# Patient Record
Sex: Male | Born: 1960 | Race: Black or African American | Hispanic: No | Marital: Married | Smoking: Former smoker
Health system: Southern US, Community
[De-identification: ages and names within clinical notes are randomized; demographics above are authoritative.]

## PROBLEM LIST (undated history)

## (undated) DIAGNOSIS — K612 Anorectal abscess: Secondary | ICD-10-CM

## (undated) DIAGNOSIS — R0600 Dyspnea, unspecified: Secondary | ICD-10-CM

## (undated) DIAGNOSIS — M1990 Unspecified osteoarthritis, unspecified site: Secondary | ICD-10-CM

## (undated) DIAGNOSIS — I509 Heart failure, unspecified: Secondary | ICD-10-CM

## (undated) DIAGNOSIS — N289 Disorder of kidney and ureter, unspecified: Secondary | ICD-10-CM

## (undated) DIAGNOSIS — G4730 Sleep apnea, unspecified: Secondary | ICD-10-CM

## (undated) DIAGNOSIS — R0609 Other forms of dyspnea: Secondary | ICD-10-CM

## (undated) DIAGNOSIS — F419 Anxiety disorder, unspecified: Secondary | ICD-10-CM

## (undated) DIAGNOSIS — J45909 Unspecified asthma, uncomplicated: Secondary | ICD-10-CM

## (undated) DIAGNOSIS — K219 Gastro-esophageal reflux disease without esophagitis: Secondary | ICD-10-CM

## (undated) DIAGNOSIS — K648 Other hemorrhoids: Secondary | ICD-10-CM

## (undated) DIAGNOSIS — K635 Polyp of colon: Secondary | ICD-10-CM

## (undated) DIAGNOSIS — R079 Chest pain, unspecified: Secondary | ICD-10-CM

## (undated) DIAGNOSIS — T7840XA Allergy, unspecified, initial encounter: Secondary | ICD-10-CM

## (undated) DIAGNOSIS — I10 Essential (primary) hypertension: Secondary | ICD-10-CM

## (undated) DIAGNOSIS — J449 Chronic obstructive pulmonary disease, unspecified: Secondary | ICD-10-CM

## (undated) DIAGNOSIS — I499 Cardiac arrhythmia, unspecified: Secondary | ICD-10-CM

## (undated) DIAGNOSIS — K589 Irritable bowel syndrome without diarrhea: Secondary | ICD-10-CM

## (undated) DIAGNOSIS — E785 Hyperlipidemia, unspecified: Secondary | ICD-10-CM

## (undated) DIAGNOSIS — F32A Depression, unspecified: Secondary | ICD-10-CM

## (undated) HISTORY — DX: Chest pain, unspecified: R07.9

## (undated) HISTORY — DX: Heart failure, unspecified: I50.9

## (undated) HISTORY — PX: POLYPECTOMY: SHX149

## (undated) HISTORY — DX: Cardiac arrhythmia, unspecified: I49.9

## (undated) HISTORY — PX: COLONOSCOPY: SHX174

## (undated) HISTORY — DX: Other hemorrhoids: K64.8

## (undated) HISTORY — DX: Sleep apnea, unspecified: G47.30

## (undated) HISTORY — DX: Anorectal abscess: K61.2

## (undated) HISTORY — DX: Chronic obstructive pulmonary disease, unspecified: J44.9

## (undated) HISTORY — DX: Disorder of kidney and ureter, unspecified: N28.9

## (undated) HISTORY — DX: Essential (primary) hypertension: I10

## (undated) HISTORY — DX: Hyperlipidemia, unspecified: E78.5

## (undated) HISTORY — DX: Allergy, unspecified, initial encounter: T78.40XA

## (undated) HISTORY — DX: Depression, unspecified: F32.A

## (undated) HISTORY — DX: Dyspnea, unspecified: R06.00

## (undated) HISTORY — DX: Irritable bowel syndrome, unspecified: K58.9

## (undated) HISTORY — DX: Other forms of dyspnea: R06.09

## (undated) HISTORY — DX: Unspecified osteoarthritis, unspecified site: M19.90

## (undated) HISTORY — DX: Polyp of colon: K63.5

## (undated) HISTORY — DX: Anxiety disorder, unspecified: F41.9

## (undated) HISTORY — DX: Gastro-esophageal reflux disease without esophagitis: K21.9

## (undated) HISTORY — PX: TONSILLECTOMY: SUR1361

---

## 2001-09-05 ENCOUNTER — Encounter: Payer: Self-pay | Admitting: Emergency Medicine

## 2001-09-05 ENCOUNTER — Emergency Department (HOSPITAL_COMMUNITY): Admission: EM | Admit: 2001-09-05 | Discharge: 2001-09-06 | Payer: Self-pay | Admitting: Emergency Medicine

## 2001-09-07 DIAGNOSIS — G4730 Sleep apnea, unspecified: Secondary | ICD-10-CM

## 2001-09-07 HISTORY — DX: Sleep apnea, unspecified: G47.30

## 2001-09-07 HISTORY — PX: ULNAR COLLATERAL LIGAMENT RECONSTRUCTION: SHX2593

## 2003-05-04 ENCOUNTER — Encounter: Payer: Self-pay | Admitting: Internal Medicine

## 2004-02-18 ENCOUNTER — Emergency Department (HOSPITAL_COMMUNITY): Admission: EM | Admit: 2004-02-18 | Discharge: 2004-02-18 | Payer: Self-pay | Admitting: Emergency Medicine

## 2006-05-17 ENCOUNTER — Ambulatory Visit: Payer: Self-pay | Admitting: Internal Medicine

## 2006-06-18 ENCOUNTER — Ambulatory Visit: Payer: Self-pay | Admitting: Internal Medicine

## 2009-11-06 ENCOUNTER — Encounter (INDEPENDENT_AMBULATORY_CARE_PROVIDER_SITE_OTHER): Payer: Self-pay | Admitting: *Deleted

## 2010-10-07 NOTE — Procedures (Signed)
Summary: Colonoscopy/[facility]  Colonoscopy/[facility]   Imported By: Sherian Rein 12/18/2009 07:09:12  _____________________________________________________________________  External Attachment:    Type:   Image     Comment:   External Document

## 2010-10-07 NOTE — Procedures (Signed)
Summary: Colonoscopy   Colonoscopy  Procedure date:  06/18/2006  Findings:      Location:  [facility].  Results: Hemorrhoids.       Procedures Next Due Date:    Colonoscopy: 06/2011  Colonoscopy  Procedure date:  06/18/2006  Findings:      Location:  [facility].  Results: Hemorrhoids.       Procedures Next Due Date:    Colonoscopy: 06/2011 Patient Name: [name], [name] MRN:  Procedure Procedures: Colonoscopy CPT: 16109.  Personnel: Endoscopist: Wilhemina Bonito. Marina Goodell, MD.  Referred By: Creola Corn, MD.  Exam Location: Exam performed in Outpatient Clinic. Outpatient  Patient Consent: Procedure, Alternatives, Risks and Benefits discussed, consent obtained, from patient. Consent was obtained by the RN.  Indications  Surveillance of: Adenomatous Polyp(s). This is an initial surveillance exam. Initial polypectomy was performed in 2004. in Aug. 1-2 Polyps were found at Index Exam. Largest polyp removed was 10 to 19 mm. Prior polyp located in distal colon. Pathology of worst  polyp: villous adenoma.  History  Current Medications: Patient is not currently taking Coumadin.  Pre-Exam Physical: Performed Jun 18, 2006. Cardio-pulmonary exam, Rectal exam WNL. HEENT exam  abnormal. Abdominal exam, Mental status exam WNL. Abnormal PE findings include: thick neck,large body habitus.  Comments: Pt. history reviewed/updated, physical exam performed prior to initiation of sedation? yes Exam Exam: Extent of exam reached: Cecum, extent intended: Cecum.  The cecum was identified by IC valve. Patient position: from side to side. Time to Cecum: 00:09:32. Time for Withdrawl: 00:14:01. Colon retroflexion performed. Images taken. ASA Classification: II. Tolerance: excellent.  Monitoring: Pulse and BP monitoring, Oximetry used. Supplemental O2 given.  Colon Prep Used osmo prep for colon prep. Prep results: excellent.  Sedation Meds: Patient assessed and  found to be appropriate for moderate (conscious) sedation. Fentanyl 100 mcg. given IV. Versed 14 given IV.  Findings NORMAL EXAM: Cecum to Rectum. Comments: unable to visualize appy orifice despite change in body position and great effort.   Assessment  Diagnoses: 455.0: Hemorrhoids, Internal. friable.   Comments: NO POLYPS SEEN Events  Unplanned Interventions: No intervention was required.  Unplanned Events: There were no complications. Plans Patient Education: Patient given standard instructions for: Hemorrhoids.  Comments: metamucil 2 tablespoons daily in 14 oz water or juice Disposition: After procedure patient sent to recovery. After recovery patient sent home.  Scheduling/Referral: Colonoscopy, to Wilhemina Bonito. Marina Goodell, MD, in 5 years,    This report was created from the original endoscopy report, which was reviewed and signed by the above listed endoscopist.   cc:  Creola Corn, MD      The Patient

## 2010-10-07 NOTE — Letter (Signed)
Summary: New Patient letter  [facility]  [address] [place], [place] [address]   Phone: [phone]  Fax: [phone]       11/06/2009 MRN: [id number]  [name] [address] [place], [place]  [address]  Dear Mr. PUROHIT,  Welcome to the Gastroenterology Division at [facility].    You are scheduled to see Dr. Marina Goodell on 12/19/2009 at 9:30AM on the 3rd floor at [facility], [address]  We ask that you try to arrive at our office 15 minutes prior to your appointment time to allow for check-in.  We would like you to complete the enclosed self-administered evaluation form prior to your visit and bring it with you on the day of your appointment.  We will review it with you.  Also, please bring a complete list of all your medications or, if you prefer, bring the medication bottles and we will list them.  Please bring your insurance card so that we may make a copy of it.  If your insurance requires a referral to see a specialist, please bring your referral form from your primary care physician.  Co-payments are due at the time of your visit and may be paid by cash, check or credit card.     Your office visit will consist of a consult with your physician (includes a physical exam), any laboratory testing he/she may order, scheduling of any necessary diagnostic testing (e.g. x-ray, ultrasound, CT-scan), and scheduling of a procedure (e.g. Endoscopy, Colonoscopy) if required.  Please allow enough time on your schedule to allow for any/all of these possibilities.    If you cannot keep your appointment, please call [phone] to cancel or reschedule prior to your appointment date.  This allows [place] the opportunity to schedule an appointment for another patient in need of care.  If you do not cancel or reschedule by 5 p.m. the business day prior to your appointment date, you will be charged a $50.00 late cancellation/no-show fee.    Thank you for choosing   Gastroenterology for your medical needs.  We appreciate the opportunity to care for you.  Please visit [place] at our website  to learn more about our practice.                     Sincerely,                                                             The Gastroenterology Division

## 2011-01-23 NOTE — Assessment & Plan Note (Signed)
[facility]                           GASTROENTEROLOGY OFFICE NOTE   [name], [name]                      MRN:          [id number]  DATE:05/17/2006                            DOB:          [birth year]    REASON FOR CONSULTATION:  Surveillance colonoscopy.   HISTORY:  This is a d45 year old African American male with a history of  hypertension, sleep apnea, morbid obesity, and adenomatous colon polyps who  was referred through the courtesy of Dr. Timothy Lasso, regarding surveillance  colonoscopy.  The patient underwent colonoscopy with Dr. Sharrell Ku in  August 2004, to evaluate rectal bleeding and hemoccult positive stool.  Examination revealed a large sessile polyp in the descending colon which was  removed endoscopically.  This measured at least 1-cm in greatest dimension  and was found to have villous architecture.  Followup in 3 years  recommended.  The patient received a recall letter from Dr. Jennye Boroughs office.  However, his current insurance is not accepted by that office and he was  referred to this clinic.  The patient also had a colonoscopy in 1998, to  evaluate rectal bleeding.  This was negative except for hemorrhoids.  He  does mention some intermittent problems with his hemorrhoids as manifested  by some occasional bleeding.  No pain or other symptoms.  He also mentions  having symptomatic reflux disease as manifested by substernal chest  discomfort.  He did take a 14-day course of Prilosec OTC with resolution of  symptom.  He denies dysphagia or weight loss.   PAST MEDICAL HISTORY:  1. Hypertension.  2. Sleep apnea.  3. Morbid obesity.  4. History of adenomatous colon polyps.   PAST SURGICAL HISTORY:  Left thumb surgery.   ALLERGIES:  NO KNOWN DRUG ALLERGIES.   CURRENT MEDICATIONS:  1. Indapamide, unspecified dosage, 1/2 daily.  2. The patient also uses a CPAP machine at night.   FAMILY HISTORY:  Negative for  gastrointestinal malignancy.   SOCIAL HISTORY:  The patient is married with 3 daughters, lives with his  wife.  He attended [employer].  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  Does not smoke.  Occasionally uses  alcohol.   REVIEW OF SYSTEMS:  Per diagnostic evaluation form.   PHYSICAL EXAMINATION:  GENERAL:  A well-appearing male in no acute distress.  VITAL SIGNS:  Blood pressure 130/84, heart rate is 64 and regular, weight is  310.4 pounds.  He is 5 feet 9 inches in height.  HEENT:  Sclerae are anicteric.  Conjunctivae are pink.  Oral mucosa is  intact.  No adenopathy.  LUNGS:  Clear.  HEART:  Regular.  ABDOMEN:  Obese and soft without tenderness, mass, or hernia.  Good bowel  sounds heard.  EXTREMITIES:  Without edema.   IMPRESSION:  1. History of villous adenoma of the descending colon, due to a      surveillance colonoscopy.  2. Internal hemorrhoids with intermittent moderate bleeding.  3. Gastroesophageal reflux disease.  Symptoms controlled with short course      of Prilosec.  No alarm symptoms.  RECOMMENDATIONS:  1. Schedule colonoscopy with polypectomy if necessary.  The nature of the      procedure as well as the risks, benefits, and alternatives were      discussed in detail.  He understood and agreed to proceed.  He prefers      the Osmo prep.  2. Prilosec OTC on demand.  3. Fiber supplement for hemorrhoids.                                   Wilhemina Bonito. Eda Keys., MD   JNP/MedQ  DD:  05/17/2006  DT:  05/18/2006  Job #:  [id number]   cc:   Gwen Pounds, MD

## 2011-06-09 ENCOUNTER — Inpatient Hospital Stay (HOSPITAL_COMMUNITY)
Admission: AD | Admit: 2011-06-09 | Discharge: 2011-06-13 | DRG: 348 | Disposition: A | Discharge status: Home Health | Payer: Managed Care, Other (non HMO) | Source: Ambulatory Visit | Attending: Internal Medicine | Admitting: Internal Medicine

## 2011-06-09 DIAGNOSIS — N179 Acute kidney failure, unspecified: Secondary | ICD-10-CM | POA: Diagnosis not present

## 2011-06-09 DIAGNOSIS — I10 Essential (primary) hypertension: Secondary | ICD-10-CM | POA: Diagnosis present

## 2011-06-09 DIAGNOSIS — E669 Obesity, unspecified: Secondary | ICD-10-CM | POA: Diagnosis present

## 2011-06-09 DIAGNOSIS — R7309 Other abnormal glucose: Secondary | ICD-10-CM | POA: Diagnosis present

## 2011-06-09 DIAGNOSIS — Z860100 Personal history of colon polyps, unspecified: Secondary | ICD-10-CM

## 2011-06-09 DIAGNOSIS — G4700 Insomnia, unspecified: Secondary | ICD-10-CM | POA: Diagnosis present

## 2011-06-09 DIAGNOSIS — G4733 Obstructive sleep apnea (adult) (pediatric): Secondary | ICD-10-CM | POA: Diagnosis present

## 2011-06-09 DIAGNOSIS — Z6841 Body Mass Index (BMI) 40.0 and over, adult: Secondary | ICD-10-CM

## 2011-06-09 DIAGNOSIS — K612 Anorectal abscess: Secondary | ICD-10-CM

## 2011-06-09 DIAGNOSIS — K219 Gastro-esophageal reflux disease without esophagitis: Secondary | ICD-10-CM | POA: Diagnosis present

## 2011-06-09 HISTORY — PX: INCISION AND DRAINAGE PERIRECTAL ABSCESS: SHX1804

## 2011-06-09 LAB — PROTIME-INR
INR: 1.18 (ref 0.00–1.49)
Prothrombin Time: 15.3 seconds — ABNORMAL HIGH (ref 11.6–15.2)

## 2011-06-09 LAB — SURGICAL PCR SCREEN
MRSA, PCR: NEGATIVE
Staphylococcus aureus: NEGATIVE

## 2011-06-09 LAB — APTT: aPTT: 31 seconds (ref 24–37)

## 2011-06-09 LAB — COMPREHENSIVE METABOLIC PANEL
AST: 14 U/L (ref 0–37)
Albumin: 3 g/dL — ABNORMAL LOW (ref 3.5–5.2)
CO2: 24 mEq/L (ref 19–32)
Calcium: 8.6 mg/dL (ref 8.4–10.5)
Creatinine, Ser: 1.15 mg/dL (ref 0.50–1.35)
GFR calc non Af Amer: 74 mL/min — ABNORMAL LOW (ref >90)

## 2011-06-09 LAB — CBC
Platelets: 196 10*3/uL (ref 150–400)
RDW: 13.5 % (ref 11.5–15.5)
WBC: 17.4 10*3/uL — ABNORMAL HIGH (ref 4.0–10.5)

## 2011-06-09 LAB — MRSA PCR SCREENING: MRSA by PCR: NEGATIVE

## 2011-06-09 LAB — GLUCOSE, CAPILLARY: Glucose-Capillary: 158 mg/dL — ABNORMAL HIGH (ref 70–99)

## 2011-06-10 LAB — GLUCOSE, CAPILLARY
Glucose-Capillary: 120 mg/dL — ABNORMAL HIGH (ref 70–99)
Glucose-Capillary: 145 mg/dL — ABNORMAL HIGH (ref 70–99)
Glucose-Capillary: 158 mg/dL — ABNORMAL HIGH (ref 70–99)

## 2011-06-11 ENCOUNTER — Inpatient Hospital Stay (HOSPITAL_COMMUNITY): Payer: Managed Care, Other (non HMO)

## 2011-06-11 LAB — BASIC METABOLIC PANEL
BUN: 20 mg/dL (ref 6–23)
BUN: 21 mg/dL (ref 6–23)
CO2: 20 mEq/L (ref 19–32)
Calcium: 8.4 mg/dL (ref 8.4–10.5)
Chloride: 98 mEq/L (ref 96–112)
GFR calc Af Amer: 27 mL/min — ABNORMAL LOW (ref >90)
GFR calc non Af Amer: 24 mL/min — ABNORMAL LOW (ref >90)
Glucose, Bld: 123 mg/dL — ABNORMAL HIGH (ref 70–99)
Potassium: 4.3 mEq/L (ref 3.5–5.1)
Potassium: 3.4 mEq/L — ABNORMAL LOW (ref 3.5–5.1)
Sodium: 133 mEq/L — ABNORMAL LOW (ref 135–145)

## 2011-06-11 LAB — SODIUM, URINE, RANDOM: Sodium, Ur: 20 mEq/L

## 2011-06-11 LAB — CBC
HCT: 35.9 % — ABNORMAL LOW (ref 39.0–52.0)
Hemoglobin: 12.3 g/dL — ABNORMAL LOW (ref 13.0–17.0)
RBC: 4.34 MIL/uL (ref 4.22–5.81)
WBC: 15 10*3/uL — ABNORMAL HIGH (ref 4.0–10.5)

## 2011-06-11 LAB — CULTURE, ROUTINE-ABSCESS

## 2011-06-11 LAB — GLUCOSE, CAPILLARY

## 2011-06-11 NOTE — Op Note (Signed)
  NAMEKYRIE, FLUDD NO.:  [id number]  MEDICAL RECORD NO.:  [id number]  LOCATION:  5511                         FACILITY:  [facility]  PHYSICIAN:  Gabrielle Dare. Janee Morn, M.D.DATE OF BIRTH:  1961/09/04  DATE OF PROCEDURE:  06/09/2011 DATE OF DISCHARGE:                              OPERATIVE REPORT   PREOPERATIVE DIAGNOSIS:  Left perirectal abscess.  POSTOPERATIVE DIAGNOSIS:  Left perirectal abscess.  PROCEDURE:  Incision and drainage, left perirectal abscess.  SURGEON:  Gabrielle Dare. Janee Morn, MD  ANESTHESIA:  General endotracheal.  ESTIMATED BLOOD LOSS:  50 mL.  HISTORY OF PRESENT ILLNESS:  Mr. Gignac is a 50 year old African American gentleman who is admitted with a large left perirectal abscess by Dr. Timothy Lasso.  He has been started on intravenous antibiotics.  We are taking him urgently to the operating room for incision and drainage.  PROCEDURE IN DETAIL:  Informed consent was obtained.  The patient was identified in the preop holding area and he is receiving intravenous antibiotics.  He was brought to the operating room.  General endotracheal anesthesia was administered by the anesthesia staff.  He was placed in lithotomy position.  His perianal and perirectal region and groin were prepped and draped in a sterile fashion.  He had very large fluctuant in the left gluteal cheek especially extending up to the anal area.  Digital rectal exam was performed, it was limited little bit due to the edema, but no gross abnormalities and no pus were noted. Next, I changed my gloves and the incision was made over the most fluctuant hard area about 3 cm in length.  Dissection was carried down into two large cavities, one extending anteriorly and one extending more deep.  Large volume of pus was returned.  This was sent for culture, both aerobic and anaerobic.  The wound was then thoroughly cleaned out. The loculations were broken up.  Hemostasis was obtained with  Bovie cautery.  We then irrigated out the wound with copious amounts of saline.  The wound was much cleaner.  It was then packed with a long length of saline- soaked Kerlix.  There was good hemostasis.  A bulky sterile dressing was applied.  The patient tolerated the procedure well without apparent complication, and was taken to recovery room in stable condition.  All counts were correct.     Gabrielle Dare Janee Morn, M.D.     BET/MEDQ  D:  06/09/2011  T:  06/10/2011  Job:  [id number]  cc:   Gwen Pounds, MD  Electronically Signed by Violeta Gelinas M.D. on 06/11/2011 02:30:51 PM

## 2011-06-12 LAB — RENAL FUNCTION PANEL
BUN: 19 mg/dL (ref 6–23)
CO2: 24 mEq/L (ref 19–32)
Chloride: 103 mEq/L (ref 96–112)
GFR calc Af Amer: 33 mL/min — ABNORMAL LOW (ref >90)
Glucose, Bld: 115 mg/dL — ABNORMAL HIGH (ref 70–99)
Potassium: 3.4 mEq/L — ABNORMAL LOW (ref 3.5–5.1)
Sodium: 138 mEq/L (ref 135–145)

## 2011-06-12 LAB — GLUCOSE, CAPILLARY
Glucose-Capillary: 127 mg/dL — ABNORMAL HIGH (ref 70–99)
Glucose-Capillary: 117 mg/dL — ABNORMAL HIGH (ref 70–99)
Glucose-Capillary: 117 mg/dL — ABNORMAL HIGH (ref 70–99)
Glucose-Capillary: 123 mg/dL — ABNORMAL HIGH (ref 70–99)

## 2011-06-12 LAB — URINALYSIS, ROUTINE W REFLEX MICROSCOPIC
Glucose, UA: NEGATIVE mg/dL
Ketones, ur: 15 mg/dL — AB
Leukocytes, UA: NEGATIVE
Nitrite: NEGATIVE
Specific Gravity, Urine: 1.013 (ref 1.005–1.030)
pH: 6 (ref 5.0–8.0)

## 2011-06-12 LAB — CBC
HCT: 34.7 % — ABNORMAL LOW (ref 39.0–52.0)
Hemoglobin: 11.6 g/dL — ABNORMAL LOW (ref 13.0–17.0)
RBC: 4.19 MIL/uL — ABNORMAL LOW (ref 4.22–5.81)

## 2011-06-13 LAB — RENAL FUNCTION PANEL
Albumin: 2.5 g/dL — ABNORMAL LOW (ref 3.5–5.2)
Calcium: 8.6 mg/dL (ref 8.4–10.5)
GFR calc Af Amer: 37 mL/min — ABNORMAL LOW (ref >90)
Glucose, Bld: 123 mg/dL — ABNORMAL HIGH (ref 70–99)
Phosphorus: 4.2 mg/dL (ref 2.3–4.6)
Sodium: 139 mEq/L (ref 135–145)

## 2011-06-13 LAB — CBC
Hemoglobin: 11.3 g/dL — ABNORMAL LOW (ref 13.0–17.0)
MCH: 27.7 pg (ref 26.0–34.0)
MCHC: 33.1 g/dL (ref 30.0–36.0)
RDW: 13.2 % (ref 11.5–15.5)

## 2011-06-13 LAB — GLUCOSE, CAPILLARY: Glucose-Capillary: 126 mg/dL — ABNORMAL HIGH (ref 70–99)

## 2011-06-16 LAB — CULTURE, BLOOD (ROUTINE X 2)
Culture  Setup Time: 201210030718
Culture  Setup Time: 201210031056
Culture: NO GROWTH
Culture: NO GROWTH

## 2011-06-16 LAB — ANAEROBIC CULTURE

## 2011-06-22 ENCOUNTER — Telehealth (INDEPENDENT_AMBULATORY_CARE_PROVIDER_SITE_OTHER): Payer: Self-pay

## 2011-06-22 NOTE — Telephone Encounter (Signed)
Patients wife called back to get a follow up appointment for patient. Needs a 7 to 10 day follow up. Said she called last week and no one called her back to fit her in to be seen with Janee Morn. Please call patient if you can find time with [facility].

## 2011-06-22 NOTE — Telephone Encounter (Signed)
Appointment was made with another physician this week.

## 2011-06-24 ENCOUNTER — Ambulatory Visit (INDEPENDENT_AMBULATORY_CARE_PROVIDER_SITE_OTHER): Payer: Managed Care, Other (non HMO) | Admitting: General Surgery

## 2011-06-24 ENCOUNTER — Encounter (INDEPENDENT_AMBULATORY_CARE_PROVIDER_SITE_OTHER): Payer: Self-pay | Admitting: General Surgery

## 2011-06-24 VITALS — BP 136/90 | HR 64 | Temp 98.3°F | Resp 20 | Ht 71.0 in | Wt 325.1 lb

## 2011-06-24 DIAGNOSIS — K612 Anorectal abscess: Secondary | ICD-10-CM

## 2011-06-24 DIAGNOSIS — K611 Rectal abscess: Secondary | ICD-10-CM

## 2011-06-24 NOTE — Progress Notes (Signed)
Subjective:     Patient ID: [name], male   DOB: 25-Aug-1961, 50 y.o.   MRN: [id number]  HPI This patient follows up in approximately 2 weeks status post incision and drainage of left buttock and perirectal abscess. His hospital stay was complicated by elevated creatinine which he tells me was due to antibiotics and blood pressure medication. He has been followed by his primary physician Dr. Timothy Lasso for this. He is no longer taking antibiotics. His daughter has been doing daily dressing changes for his buttock wound.  Review of Systems     Objective:   Physical Exam The wound appears to be healing well. There is no sign of residual infection. He has healthy granulation tissue with a small amount of fibrinous exudate. The wound is shallow and requires only very minimal packing and the medial aspect of the wound. I think that this should continue to heal should be healed within 2 weeks.    Assessment:     Status post I&D of left buttock wound-doing well.    Plan:     The wound should continue to heal with daily dressing changes. I recommended that he shower normally. He should followup in approximately 2 weeks for repeat evaluation to insure complete wound healing. He is going to followup with Dr. Timothy Lasso for his elevated creatinine.

## 2011-06-30 ENCOUNTER — Encounter: Payer: Self-pay | Admitting: Internal Medicine
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
## 2011-07-01 ENCOUNTER — Encounter: Payer: Self-pay | Admitting: Internal Medicine

## 2011-07-01 NOTE — Consult Note (Signed)
NAMEMarland Kitchen  TYTAN, SANDATE NO.:  [id number]  MEDICAL RECORD NO.:  [id number]  LOCATION:                                 FACILITY:  PHYSICIAN:  Gardiner Barefoot, MD    DATE OF BIRTH:  1961/03/04  DATE OF CONSULTATION: DATE OF DISCHARGE:                                CONSULTATION   CONSULTING PHYSICIAN:  Dr. Timothy Lasso.  REASON FOR CONSULTATION:  Antibiotic management.  HISTORY OF PRESENT ILLNESS:  This is a 50 year old male with obesity, obstructive sleep apnea, and diabetes versus impaired fasting glucose, who had several days of what sounds like perirectal pain, thought to be due to his hemorrhoids, and not improving with home therapy, and was felt to be a cellulitis in the gluteal area versus a concern for abscess development.  He was sent into [facility] for evaluation by General Surgery and that was felt to have a perirectal abscess and underwent incision and drainage on October 2.  In the operating arena, it was found to have a large fluctuant area in the left gluteal cheek that did extend to the anal area and an incision was done and large volume of pus was expressed and irrigated.  Cultures subsequently did show a Gram stain that was polymicrobial and the culture that did grow Streptococcus.  Though post surgery did feels significant relief in fact he has had much very little pain since the procedure.  He has had a fever up to 102.7 yesterday a.m., however, has been afebrile since. Additionally, his vital signs otherwise had been stable.  PAST MEDICAL HISTORY: 1. Hypertension. 2. Obesity. 3. Obstructive sleep apnea. 4. History of anemia. 5. Diabetes with A1c of 6.3-6.5. 6. Insomnia. 7. History of cardiomyopathy. 8. GERD. 9. History of left thumb surgery, T and A, circumcision, and trigger     finger injection.  MEDICATIONS:  Currently include vancomycin and Zosyn.  ALLERGIES:  No known drug allergies.  SOCIAL HISTORY:  The patient is  married and his wife is at the bedside. He did quit smoking in about 1997.  FAMILY HISTORY:  There is a father with diabetes and coronary artery disease as well as glaucoma.  The mother also has diabetes and hypertension.  REVIEW OF SYSTEMS:  A 12-point review of systems was negative except as per the history of present illness.  PHYSICAL EXAMINATION:  VITAL SIGNS:  Temperature is 99.2, T-max is 102.7 which was more than 24 hours ago, pulse 96, respirations 24, blood pressure 115/75, and O2 sats 94% on CPAP, nasal. GENERAL:  The patient is awake, alert, and oriented x3, and appears in no acute distress. CARDIOVASCULAR:  Regular rate and rhythm.  No murmurs, rubs, or gallops. LUNGS:  Clear to auscultation bilaterally. ABDOMEN:  Morbidly obese, nontender, nondistended, with positive bowel sounds.  No hepatosplenomegaly. EXTREMITIES:  No edema. GU:  Rectal area does show no fluctuance, it is mildly tender, but no current drainage, there is a bandage overlying it that does not appear to be significantly saturated.  LABS:  Blood cultures all remained negative during this hospitalization. MRSA/PCR screening is negative.  Abscess culture does show  a polymicrobial Gram stain with a streptococci as a  dominant organism on the culture.  Creatinine is 2.95 which is higher than his baseline which was in the normal range.  WBC today is 15.0, which is improved from 17 the previous day.  ASSESSMENT:  Perirectal abscess based on the culture results which do support the diagnosis.  RECOMMENDATIONS:  For perirectal abscess, there is a little role for antibiotics and is generally a surgical management.  I have stopped his IV antibiotics.  With diabetes though I will give him a short course of Augmentin which he can take for about 7 days.  I did tell him though if he has any difficulty with intolerance, to just stop the antibiotic as there is no significant role for the antibiotics.  There is no  staph organisms suggestion of this or skin abscess and therefore no need for longer course of therapy.     Gardiner Barefoot, MD     RWC/MEDQ  D:  06/11/2011  T:  06/11/2011  Job:  [id number]  Electronically Signed by Staci Righter MD on 07/01/2011 04:37:17 PM
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
## 2011-07-05 NOTE — Consult Note (Signed)
[name], EARLY NO.:  [id number]  MEDICAL RECORD NO.:  [id number]  LOCATION:  5511                         FACILITY:  [facility]  PHYSICIAN:  Gabrielle Dare. Janee Morn, M.D.DATE OF BIRTH:  Sep 04, 1961  DATE OF CONSULTATION:  06/09/2011 DATE OF DISCHARGE:                                CONSULTATION   REQUESTING PHYSICIAN:  Gwen Pounds, MD  CONSULTING SURGEON:  Gabrielle Dare. Janee Morn, MD  REASON FOR CONSULTATION:  Perirectal abscess.  HISTORY OF PRESENT ILLNESS:  [name] is a 50 year old African American gentleman who has an underlying history of hypertension and diet-controlled diabetes.  He states he had a profuse course of nausea, vomiting, and diarrhea after eating some shrimp couple of nights ago, started feeling a little bit better, but then developed some perirectal pain and swelling.  He saw his primary care physician who placed him on some p.o. antibiotics, but within 24 hours the swelling was worse.  His pain was worse and his primary care physician, Dr. Timothy [name], believes that patient had developing worsening perirectal abscess that is in need of the surgical care.  He has now subsequently been admitted and we are asked to evaluate him.  He denies any fever, but aside from his nausea, vomiting, diarrhea course, he denies any other abdominal pain or complaints.  He has not had any recent trauma or travel history.  MEDICAL HISTORY:  Significant for hypertension, diet-controlled diabetes mellitus, and obesity.  PAST SURGICAL HISTORY:  The patient had tonsillectomy as a child.  No prior abdominal surgery.  FAMILY HISTORY:  Noncontributory to present case.  SOCIAL HISTORY:  The patient is a former smoker, quit about 15 years ago, has an occasional alcoholic beverage.  Denies any illicit drug use. He is married.  He lives at home with his wife and is employed for an [employer], [employer].  REVIEW OF SYSTEMS:  Please see history of present illness for  pertinent findings.  PHYSICAL EXAMINATION:  GENERAL:  Reveals an obese 50 year old African American gentleman who is nontoxic appearing. VITAL SIGNS:  Show temperature of 98.5, heart rate of 72, blood pressure 136/90, respiratory rate of 16, oxygen saturation 100% on room air. ENT:  Unremarkable.  Neck is supple without lymphadenopathy. LUNGS:  Clear to auscultation.  No wheezes, rhonchi, or rales.  Normal respiratory effort without use of accessory muscles.  HEART:  Regular rate and rhythm.  No murmurs, gallops, or rubs.  Carotids 2+ and brisk without bruit. ABDOMEN:  Soft, nondistended, nontender.  Normal bowel sounds. Perirectal region reveals a large area of induration, erythema, and tenderness on the left medial buttock adjacent to the anus.  There is no active draining, but there is evidence of fluctuance consistent with perirectal abscess.  No adjacent tracking to the perineum or scrotum is appreciated.  LABS:  No labs have been drawn.  No imaging has been performed.  IMPRESSION: 1. Large left perirectal abscess. 2. Hypertension. 3. Diet controlled diabetes mellitus. 4. Obesity.  RECOMMENDATIONS:  The patient needs incision and drainage in an operative setting.  I have discussed this with the patient including the risks, complications, postoperative expectations with his wife as well. He agrees and will consent, we will proceed  with that.     Brayton El, PA-C   ______________________________ Gabrielle Dare. Janee Morn, M.D.    KB/MEDQ  D:  06/09/2011  T:  06/10/2011  Job:  [id number]  Electronically Signed by Brayton El  on 06/24/2011 03:50:17 PM Electronically Signed by Violeta Gelinas M.D. on 07/05/2011 08:51:25 AM

## 2011-07-05 NOTE — Consult Note (Signed)
[name], [name] NO.:  [id number]  MEDICAL RECORD NO.:  [id number]  LOCATION:  5505                         FACILITY:  [facility]  PHYSICIAN:  Dr. Allena Katz              DATE OF BIRTH:  Jan 13, 1961  DATE OF CONSULTATION:  06/11/2011 DATE OF DISCHARGE:                                CONSULTATION   PRIMARY CARE PHYSICIAN:  Dr. Timothy Lasso.  CHIEF COMPLAINT:  Acute renal failure status post I and D of a perirectal abscess.  HISTORY OF PRESENT ILLNESS:  The patient is a 50 year old male who was admitted by PCP for perirectal abscess 2 days ago.  The patient suffered from an acute diarrheal illness approximately 5 days ago and developed some pain in the anal area approximately 3 days ago.  He went to his PCP's office where he was treated with oral clindamycin.  The following day his pain had worsened and he returned.  His PCP was quite concerned about the progression of his condition and admitted him.  General Surgery was consulted and performed an I and D on June 09, 2011.  The patient tolerated the procedure well and was doing well postoperatively. He did not have any labs drawn on June 10, 2011.  When his BMET was drawn on June 11, 2011, he was discovered to have an elevated creatinine to 2.85 and a repeat later that day showed a creatinine of 2.95.  The patient had been on an ACE inhibitor at that time which was subsequently discontinued, and had also been on vanc and Zosyn both of which have been continued by Infectious Disease.  The patient was noted to have a single episode of mild hypotension with systolic blood pressure into the 90s at the end of his procedure on October 2, he was also noted to have a mildly elevated creatinine of 1.1 at the time of admission.  Today, the patient reports no recent contrast studies, good urine output, and no problems with p.o. intake.  PAST MEDICAL HISTORY:  Impaired glucose tolerance, hypertension, obesity, obstructive  sleep apnea on CPAP, and gastroesophageal reflux disease.  FAMILY HISTORY:  Father had diabetes, osteoarthritis, coronary artery disease, hypertension, and glaucoma.  Mother had diabetes, hypertension, and osteoarthritis.  SOCIAL HISTORY:  Married with 3 children.  Tobacco use previously, but quit in 1997.  No significant alcohol use.  No illicit substance use.  ALLERGIES:  No known drug allergies.  MEDICATIONS: 1. Protonix 40 mg by mouth daily. 2. Subcu heparin 3 times daily. 3. Augmentin by mouth per Infectious Disease recommendations. 4. Tylenol as needed. 5. IV Dilaudid as needed. 6. Percocet by mouth as needed. 7. Morphine IV as needed. 8. Ambien 10 mg by mouth at night as needed. 9. Maalox by mouth as needed. 10.Benadryl as needed. 11.Sliding scale insulin scheduled every 6 hours. 12.Senokot 2 tablets by mouth at night. 13.Flexeril 10 mg by mouth 3 times daily as needed.  PHYSICAL EXAMINATION:  VITAL SIGNS:  Pulse 96, blood pressure 115/75, respirations 22, satting 94% on room air.  Temperature is 99.2 degrees Fahrenheit. GENERAL:  No acute distress, morbidly obese. HEENT:  Mucous membranes are moist. NECK:  Large  neck, no ability to assess for jugular venous distention. CARDIOVASCULAR:  Regular rate and rhythm. RESPIRATORY:  Clear to auscultation bilaterally, decreased bilaterally. ABDOMEN:  Soft, nontender, nondistended, extremely obese. EXTREMITIES:  Trace lower extremity edema with 2+ pulses. NEURO:  Alert and oriented, moves all extremities without problem.  The patient's I's and O's are reported as 2.3 L in and 1.3 L out yesterday with 710 mL in today.  LABS AND IMAGING:  CBC shows a white count of 15 and hemoglobin of 12.3. Basic metabolic notable for a potassium of 3.4, bicarb of 24, BUN of 21, and creatinine of 2.95.  CK obtained on the date of admission was 309. Creatinine trend is 1.15 on admission rising to 2.85 this morning, rising to 2.95 this  afternoon. Of note, there is no imaging that has been performed during this admission.  ASSESSMENT AND PLAN:  This is a 50 year old male with hypertension and impaired glucose tolerance presented with acute kidney injury following I and D of a perirectal abscess. 1. Acute kidney injury:  Feel this is likely prerenal azotemia     resulting from a diarrheal illness complicated by ACE inhibitor     administration and mild postoperative hypotension resulting in     ischemic acute tubular necrosis.  He was on vancomycin for 3 doses,     and this can have some nephrotoxic effect.  His renal ultrasound,     renal sediment, and electrolytes are currently pending.  We would     agree with discontinuing the ACE inhibitor, recommend a renal panel     tomorrow accompanied by the addition of urine, creatinine level 2     is already ordered labs.  We will obtain a vancomycin level in the     morning to monitor for toxicity.  We would expect that his ATN     would likely plateau some time within the next 24-36 hours and we     will continue to follow in 2 weeks with the appropriate     resolution.. 2. Hypertension:  We would avoid nephrotoxic drugs at this point, as     is already being done.  He does not currently appear to be having     significant problems with hypertension. 3. Infectious Disease:  Now on Augmentin per ID recommendations.     Agree with current management.    ______________________________ Majel Homer, MD   ______________________________ Dr. Allena Katz    ER/MEDQ  D:  06/11/2011  T:  06/11/2011  Job:  [id number]  Electronically Signed by Manuela Neptune MD on 06/19/2011 09:05:31 AM Electronically Signed by Zetta Bills MD on 07/05/2011 10:18:54 AM

## 2011-07-08 ENCOUNTER — Encounter (INDEPENDENT_AMBULATORY_CARE_PROVIDER_SITE_OTHER): Payer: Self-pay | Admitting: General Surgery

## 2011-07-08 ENCOUNTER — Ambulatory Visit (INDEPENDENT_AMBULATORY_CARE_PROVIDER_SITE_OTHER): Payer: Managed Care, Other (non HMO) | Admitting: General Surgery

## 2011-07-08 VITALS — BP 148/92 | HR 60 | Temp 97.0°F | Resp 20 | Ht 71.0 in | Wt 324.4 lb

## 2011-07-08 DIAGNOSIS — K612 Anorectal abscess: Secondary | ICD-10-CM

## 2011-07-08 DIAGNOSIS — K611 Rectal abscess: Secondary | ICD-10-CM

## 2011-07-08 NOTE — Patient Instructions (Signed)
Cover wound with gauze until closed

## 2011-07-08 NOTE — Progress Notes (Signed)
Subjective:     Patient ID: [name], male   DOB: 07-29-1961, 50 y.o.   MRN: [id number]  HPI Patient is status post incision and drainage large left perirectal abscess. His hospitalization was completed by renal insufficiency as well. He is followed up with Dr. Timothy Lasso and creatinine earlier this week was 1.6 which is improved. He completed his course of antibiotics. He is having some discomfort while sitting for long periods but otherwise is not having any further drainage or other issues.  Review of Systems     Objective:   Physical Exam Incision and drainage site on left buttock is healing well. There is no induration, erythema or fluctuance. The granulation tissue is up above the level of the skin. This was treated locally with silver nitrate. A gauze dressing was applied.    Assessment:     Coming along well after incision and drainage large left perirectal  abscess    Plan:     This was treated locally with silver nitrate. Gauze dressings daily until closed. Followup p.r.n.

## 2011-07-27 ENCOUNTER — Encounter: Payer: Self-pay | Admitting: Internal Medicine

## 2011-07-27 ENCOUNTER — Ambulatory Visit (INDEPENDENT_AMBULATORY_CARE_PROVIDER_SITE_OTHER): Payer: Managed Care, Other (non HMO) | Admitting: General Surgery

## 2011-07-27 ENCOUNTER — Encounter (INDEPENDENT_AMBULATORY_CARE_PROVIDER_SITE_OTHER): Payer: Self-pay | Admitting: General Surgery

## 2011-07-27 VITALS — BP 138/98 | HR 80 | Temp 97.6°F | Resp 16 | Ht 71.0 in | Wt 319.8 lb

## 2011-07-27 DIAGNOSIS — K612 Anorectal abscess: Secondary | ICD-10-CM

## 2011-07-27 MED ORDER — SULFAMETHOXAZOLE-TRIMETHOPRIM 800-160 MG PO TABS: 1.0000 | ORAL_TABLET | Freq: Two times a day (BID) | ORAL | Status: AC

## 2011-07-27 MED ORDER — OXYCODONE-ACETAMINOPHEN 5-325 MG PO TABS: 1.0000 | ORAL_TABLET | ORAL | Status: DC | PRN

## 2011-07-27 NOTE — Progress Notes (Signed)
Patient ID: [name], male   DOB: 1961-08-03, 50 y.o.   MRN: [id number]  Chief Complaint  Patient presents with  . Rectal Problems    Peri rectal abscess    HPI [name] is a 50 y.o. male.   HPI  He underwent incision and drainage of a large perirectal abscess by Dr. Janee Morn June 09, 2011. The area healed and he was last seen by Dr. Janee Morn July 08, 2011. He has not been on antibiotics. Recently, he had to strain to have a bowel movement then developed pain and swelling near the previous abscess site. No fever or chills. He returns to have this evaluated.  Past Medical History  Diagnosis Date  . Hypertension   . Diabetes mellitus   . Abscess of anal and rectal regions   . Acute renal insufficiency     Past Surgical History  Procedure Date  . Incision and drainage perirectal abscess 06/09/11  . Tonsillectomy     Family History  Problem Relation Age of Onset  . Diabetes Father   . Heart disease Father     Social History History  Substance Use Topics  . Smoking status: Former Games developer  . Smokeless tobacco: Never Used  . Alcohol Use: Yes    Allergies  Allergen Reactions  . Shellfish Allergy     Upsets pt's stomach- vomiting and diarrhea    Current Outpatient Prescriptions  Medication Sig Dispense Refill  . Lancets (FREESTYLE) lancets       . oxyCODONE-acetaminophen (ROXICET) 5-325 MG per tablet Take 1 tablet by mouth every 4 (four) hours as needed for pain.  30 tablet  0  . sulfamethoxazole-trimethoprim (BACTRIM DS) 800-160 MG per tablet Take 1 tablet by mouth 2 (two) times daily.  14 tablet  0    Review of Systems Review of Systems  Constitutional: Negative for fever and chills.  Gastrointestinal:       Constipation.    Blood pressure 138/98, pulse 80, temperature 97.6 F (36.4 C), temperature source Temporal, resp. rate 16, height 5\' 11"  (1.803 m), weight 319 lb 12.8 oz (145.06 kg).  Physical Exam Physical Exam  Constitutional:   Obese male in no acute distress.  Genitourinary:       Left perianal scar. There is warmth and induration anterior to this with tenderness. It is tracking toward the scrotum. Limited ultrasound was performed demonstrating a fluid collection in the subcutaneous tissues in this area.    Data Reviewed Operative note from June 09, 2011  Assessment    Recurrent left anorectal abscess.    Plan    I & D under local anesthesia.  Doxycycline. Daily dressing changes via HHRN.  Return visit in 2 weeks.    Procedure: The left perianal area were sterilely prepped and anesthetized with 1% Xylocaine. A 22-gauge needle was inserted into the tissues and bloody, purulent fluid evacuated. A cruciate incision was then made and a 5 cm cavity was entered and bloody, purulent fluid drained. Bleeding at the skin edge was controlled with cautery. It was packed with iodoform gauze followed by dry dressing. He tolerated the procedure well.   Tilly Pernice J 07/27/2011, 6:13 PM

## 2011-07-27 NOTE — Patient Instructions (Signed)
Saline wet to dry daily dressing changes.  Wound is 2 inches deep.

## 2011-07-29 ENCOUNTER — Ambulatory Visit (INDEPENDENT_AMBULATORY_CARE_PROVIDER_SITE_OTHER): Payer: Managed Care, Other (non HMO) | Admitting: General Surgery

## 2011-07-29 ENCOUNTER — Encounter (INDEPENDENT_AMBULATORY_CARE_PROVIDER_SITE_OTHER): Payer: Self-pay | Admitting: General Surgery

## 2011-07-29 VITALS — BP 156/88 | HR 104 | Temp 96.9°F | Resp 24 | Ht 71.0 in | Wt 318.8 lb

## 2011-07-29 DIAGNOSIS — K612 Anorectal abscess: Secondary | ICD-10-CM

## 2011-07-29 NOTE — Progress Notes (Signed)
Subjective:     Patient ID: [name], male   DOB: May 09, 1961, 50 y.o.   MRN: [id number]  HPI The patient is a 50 year old black male who was seen 2 days ago by Dr. Abbey Chatters for a perirectal abscess. The area was incised and drained. He continues to have pain and some drainage from the area. He denies any fevers.  Review of Systems     Objective:   Physical Exam On exam the wound is relatively clean. The packing was removed. The wound was probed with a Q-tip and I think the packing simply just wasn't deep enough. I repacked the wound today. He tolerated this well. We will contact home health about packing gauze deeper.    Assessment:     Perirectal abscess    Plan:     He will continue daily dressing changes. We will have him followup next week with either Dr. Abbey Chatters Or Dr. Janee Morn

## 2011-07-29 NOTE — Patient Instructions (Signed)
Need to pack the wound deeper

## 2011-08-03 ENCOUNTER — Ambulatory Visit (INDEPENDENT_AMBULATORY_CARE_PROVIDER_SITE_OTHER): Payer: Managed Care, Other (non HMO) | Admitting: General Surgery

## 2011-08-03 ENCOUNTER — Encounter (INDEPENDENT_AMBULATORY_CARE_PROVIDER_SITE_OTHER): Payer: Self-pay | Admitting: General Surgery

## 2011-08-03 VITALS — BP 128/74 | HR 88 | Temp 97.8°F | Resp 20 | Ht 71.0 in | Wt 321.8 lb

## 2011-08-03 DIAGNOSIS — Z9889 Other specified postprocedural states: Secondary | ICD-10-CM

## 2011-08-03 NOTE — Progress Notes (Signed)
Operation:  Incision and drainage of recurrent anorectal abscess  Date: 07/27/2011  Pathology:  Not done  HPI: He is feeling much better overall. The wound seems to be healing well. No fevers. He is almost finished with his Bactrim.   Physical Exam:  Left anal rectal wound is clean and approximately 2-2.5 cm deep. No erythema. No purulent drainage.   Assessment:  Recurrent left anal rectal abscess status post incision and drainage-wound healing in well by secondary intention.  Plan: Continue current wound care. Return visit 3 weeks.

## 2011-08-03 NOTE — Patient Instructions (Signed)
Use a donut cushion when sitting.

## 2011-08-17 ENCOUNTER — Ambulatory Visit (INDEPENDENT_AMBULATORY_CARE_PROVIDER_SITE_OTHER): Payer: Managed Care, Other (non HMO) | Admitting: General Surgery

## 2011-08-17 ENCOUNTER — Encounter (INDEPENDENT_AMBULATORY_CARE_PROVIDER_SITE_OTHER): Payer: Self-pay | Admitting: General Surgery

## 2011-08-17 VITALS — BP 132/86 | HR 60 | Temp 97.4°F | Resp 20 | Ht 71.0 in | Wt 321.4 lb

## 2011-08-17 DIAGNOSIS — K612 Anorectal abscess: Secondary | ICD-10-CM

## 2011-08-17 NOTE — Patient Instructions (Signed)
Continue warm tub soaks at least a couple times a day Keep clean dry gauze on rectum as needed

## 2011-08-17 NOTE — Progress Notes (Signed)
Subjective:     Patient ID: [name], male   DOB: 1961/07/24, 50 y.o.   MRN: [id number]  HPI The patient is a 50 year old black male who is now 3 weeks out from an incision and drainage of a perirectal abscess. He has experienced some increased pain with sitting today. He is concerned that the abscess may be coming back. He denies any fevers or chills. He denies any drainage from the area.  Review of Systems  Constitutional: Negative.   HENT: Negative.   Eyes: Negative.   Respiratory: Negative.   Cardiovascular: Negative.   Gastrointestinal: Positive for rectal pain.  Genitourinary: Negative.   Musculoskeletal: Negative.   Skin: Negative.   Neurological: Negative.   Hematological: Negative.   Psychiatric/Behavioral: Negative.        Objective:   Physical Exam  Constitutional: He is oriented to person, place, and time. He appears well-developed and well-nourished.  HENT:  Head: Normocephalic and atraumatic.  Eyes: Conjunctivae and EOM are normal. Pupils are equal, round, and reactive to light.  Neck: Normal range of motion. Neck supple.  Cardiovascular: Normal rate, regular rhythm and normal heart sounds.   Pulmonary/Chest: Effort normal and breath sounds normal.  Abdominal: Soft. Bowel sounds are normal.  Genitourinary:       He has stable scars in the left perirectal/buttock area. The open wound in this area is almost completely healed with no further sign of infection. He has a little bit of blood near the rectum but on inspection I cannot find a source for this.The patient would not allow anoscopic exam  Musculoskeletal: Normal range of motion.  Neurological: He is alert and oriented to person, place, and time.  Skin: Skin is warm and dry.  Psychiatric: He has a normal mood and affect. His behavior is normal.       Assessment:     Rectal pain    Plan:     At this point I cannot appreciate any evidence for recurrence of his abscess. I have encouraged him to  continue warm tub soaks. He has a scheduled followup appointment next week with Dr. Abbey Chatters. He agrees to call us if he develops fevers or worsening pain

## 2011-08-25 ENCOUNTER — Encounter: Payer: Managed Care, Other (non HMO) | Admitting: Internal Medicine

## 2011-08-25 ENCOUNTER — Encounter (INDEPENDENT_AMBULATORY_CARE_PROVIDER_SITE_OTHER): Payer: Self-pay | Admitting: General Surgery

## 2011-08-25 ENCOUNTER — Ambulatory Visit (INDEPENDENT_AMBULATORY_CARE_PROVIDER_SITE_OTHER): Payer: Managed Care, Other (non HMO) | Admitting: General Surgery

## 2011-08-25 VITALS — BP 126/78 | HR 92 | Temp 98.4°F | Resp 24 | Ht 71.0 in | Wt 323.4 lb

## 2011-08-25 DIAGNOSIS — K612 Anorectal abscess: Secondary | ICD-10-CM

## 2011-08-25 NOTE — Progress Notes (Signed)
Operation:  Incision and drainage of recurrent anorectal abscess  Date: 07/27/2011  Pathology:  Not done  HPI: [name] is still having some drainage from the wound but no pain. He saw Dr. Carolynne Edouard in the interim because he is concerned about recurrent abscess but this was not found.   Physical Exam:  Left anal rectal wound is clean and approximately 1.5 cm deep. No erythema. No purulent drainage.   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  Plan: Continue current wound care. Return visit 6 weeks. If he is still having drainage denies suspect he may have a fistula and I recommended we set him up for an exam under anesthesia and possible operation to repair the fistula.

## 2011-08-25 NOTE — Patient Instructions (Signed)
Continue applying a dry dressing to the area.

## 2011-10-09 ENCOUNTER — Encounter (INDEPENDENT_AMBULATORY_CARE_PROVIDER_SITE_OTHER): Payer: Self-pay | Admitting: General Surgery

## 2011-10-09 ENCOUNTER — Ambulatory Visit (INDEPENDENT_AMBULATORY_CARE_PROVIDER_SITE_OTHER): Payer: Managed Care, Other (non HMO) | Admitting: General Surgery

## 2011-10-09 VITALS — BP 144/90 | HR 68 | Temp 97.8°F | Resp 18 | Ht 71.0 in | Wt 329.2 lb

## 2011-10-09 DIAGNOSIS — Z09 Encounter for follow-up examination after completed treatment for conditions other than malignant neoplasm: Secondary | ICD-10-CM

## 2011-10-09 NOTE — Patient Instructions (Signed)
Call us back if he you evidence of recurrent infection or persistent drainage after 2 months.

## 2011-10-09 NOTE — Progress Notes (Signed)
He is here for followup after drainage of recurrent perirectal abscess. He says the drainage is much better and he feels better. He says the area where the scar is is very firm.  Exam: Left buttock wound is almost completely healed with a very shallow open area. No erythema or induration.  Assessment: Recurrent left perirectal abscess status post incision and drainage. Wound is almost completely healed.   Plan: I told him if he had a recurrent abscess in this area or persistent drainage after 2 months and he needs to come back and see [place]. At that time we will need to go ahead and schedule him for an examination under anesthesia to make sure he does not have a fistula.

## 2011-12-15 ENCOUNTER — Ambulatory Visit (AMBULATORY_SURGERY_CENTER): Payer: Managed Care, Other (non HMO)

## 2011-12-15 VITALS — Ht 71.0 in | Wt 332.6 lb

## 2011-12-15 DIAGNOSIS — Z1211 Encounter for screening for malignant neoplasm of colon: Secondary | ICD-10-CM

## 2011-12-15 DIAGNOSIS — Z86010 Personal history of colonic polyps: Secondary | ICD-10-CM

## 2011-12-15 MED ORDER — PEG-KCL-NACL-NASULF-NA ASC-C 100 G PO SOLR: 1.0000 | Freq: Once | ORAL | Status: AC

## 2011-12-22 ENCOUNTER — Telehealth: Payer: Self-pay | Admitting: Internal Medicine

## 2011-12-22 NOTE — Telephone Encounter (Signed)
Talked with pt's wife.  Pt is taking an antibiotic for respiratory infection and wants to know if it is okay to have colonoscopy next week.   Instructed to call to cancel if pt still has fever and is still sick day before procedure; otherwise okay to proceed with procedure as planned. [name]

## 2011-12-29 ENCOUNTER — Encounter: Payer: Self-pay | Admitting: Internal Medicine

## 2011-12-29 ENCOUNTER — Ambulatory Visit (AMBULATORY_SURGERY_CENTER): Payer: Managed Care, Other (non HMO) | Admitting: Internal Medicine

## 2011-12-29 VITALS — BP 135/81 | HR 89 | Temp 97.2°F | Resp 14 | Ht 71.0 in | Wt 332.0 lb

## 2011-12-29 DIAGNOSIS — Z86010 Personal history of colonic polyps: Secondary | ICD-10-CM

## 2011-12-29 DIAGNOSIS — D126 Benign neoplasm of colon, unspecified: Secondary | ICD-10-CM

## 2011-12-29 DIAGNOSIS — Z1211 Encounter for screening for malignant neoplasm of colon: Secondary | ICD-10-CM

## 2011-12-29 MED ORDER — SODIUM CHLORIDE 0.9 % IV SOLN: 500.0000 mL | INTRAVENOUS | Status: DC

## 2011-12-29 NOTE — Patient Instructions (Signed)

## 2011-12-29 NOTE — Progress Notes (Signed)
Patient did not experience any of the following events: a burn prior to discharge; a fall within the facility; wrong site/side/patient/procedure/implant event; or a hospital transfer or hospital admission upon discharge from the facility. (G8907) Patient did not have preoperative order for IV antibiotic SSI prophylaxis. (G8918)  

## 2011-12-29 NOTE — Op Note (Signed)
[facility] [address] [place], [place]  [address]  COLONOSCOPY PROCEDURE REPORT  PATIENT:  Azavier, Creson  MR#:  [id number] BIRTHDATE:  [birth year], 50 yrs. old  GENDER:  male ENDOSCOPIST:  Wilhemina Bonito. Eda Keys, MD REF. BY:  Surveillance Program Recall, PROCEDURE DATE:  12/29/2011 PROCEDURE:  Colonoscopy with snare polypectomy x 1 ASA CLASS:  Class II INDICATIONS:  history of pre-cancerous (adenomatous) colon polyps, surveillance and high-risk screening ; [place] 2004 ([place] f/u 2007 MEDICATIONS:   MAC sedation, administered by CRNA, propofol (Diprivan) 200 mg IV  DESCRIPTION OF PROCEDURE:   After the risks benefits and alternatives of the procedure were thoroughly explained, informed consent was obtained.  Digital rectal exam was performed and revealed no abnormalities.   The LB CF-[id number] endoscope was introduced through the anus and advanced to the cecum, which was identified by both the appendix and ileocecal valve, without limitations.  The quality of the prep was excellent, using MoviPrep.  The instrument was then slowly withdrawn as the colon was fully examined. <<PROCEDUREIMAGES>>  FINDINGS:  A diminutive polyp was found in the ascending colon and snared without cautery. Retrieval was successful.   Otherwise normal colonoscopy without other polyps, masses, vascular ectasias, or inflammatory changes.   Retroflexed views in the rectum revealed internal hemorrhoids.    The time to cecum =  2:54 minutes. The scope was then withdrawn in  11:35  minutes from the cecum and the procedure completed.  COMPLICATIONS:  None  ENDOSCOPIC IMPRESSION: 1) Diminutive polyp in the ascending colon - removed 2) Otherwise normal colonoscopy 3) Internal hemorrhoids  RECOMMENDATIONS: 1) Follow up colonoscopy in 5 years  ______________________________ Wilhemina Bonito. Eda Keys, MD  CC:  Creola Corn, MD;  The Patient  n. eSIGNED:   Wilhemina Bonito. Eda Keys at 12/29/2011 09:14  AM  Zadie Cleverly, [phone]

## 2011-12-29 NOTE — Progress Notes (Signed)
Pt states that he is having some perianal pain after his many trips to the bathroom d/t his prep.

## 2011-12-30 ENCOUNTER — Telehealth: Payer: Self-pay | Admitting: *Deleted

## 2011-12-30 NOTE — Telephone Encounter (Signed)
  Follow up Call-  Call back number 12/29/2011  Post procedure Call Back phone  # [phone]  Permission to leave phone message Yes     Patient questions:  Do you have a fever, pain , or abdominal swelling? no Pain Score  0 *  Have you tolerated food without any problems? yes  Have you been able to return to your normal activities? yes  Do you have any questions about your discharge instructions: Diet   no Medications  no Follow up visit  no  Do you have questions or concerns about your Care? no  Actions: * If pain score is 4 or above: No action needed, pain <4.

## 2012-01-04 ENCOUNTER — Encounter: Payer: Self-pay | Admitting: Internal Medicine

## 2012-02-04 ENCOUNTER — Ambulatory Visit (INDEPENDENT_AMBULATORY_CARE_PROVIDER_SITE_OTHER): Payer: Managed Care, Other (non HMO) | Admitting: Surgery

## 2012-02-04 ENCOUNTER — Encounter (INDEPENDENT_AMBULATORY_CARE_PROVIDER_SITE_OTHER): Payer: Self-pay | Admitting: Surgery

## 2012-02-04 ENCOUNTER — Encounter (INDEPENDENT_AMBULATORY_CARE_PROVIDER_SITE_OTHER): Payer: Self-pay | Admitting: General Surgery

## 2012-02-04 VITALS — BP 130/78 | HR 96 | Temp 98.8°F | Resp 18 | Ht 71.0 in | Wt 334.4 lb

## 2012-02-04 DIAGNOSIS — K612 Anorectal abscess: Secondary | ICD-10-CM

## 2012-02-04 DIAGNOSIS — K610 Anal abscess: Secondary | ICD-10-CM

## 2012-02-04 NOTE — Progress Notes (Signed)
Subjective:     Patient ID: [name], male   DOB: 02-28-1961, 51 y.o.   MRN: [id number]  HPIThis is a patient well known to Dr. Abbey Chatters with recurrent perirectal abscesses. He was last seen in February. He reports the wound healed but now has recurrent discomfort. This is been going on for only 24 hours   Review of Systems     Objective:   Physical Exam On exam, he is developed another abscess at the left perianal scar. I prepped this with Betadine and anesthetized with lidocaine and drained a large abscess cavity with a scalpel. I then packed with gauze    Assessment:     Recurrent perianal abscess    Plan:     Per Dr. Maris Berger last note, he will need an examination under anesthesia to rule out fistula. I will refer hydrocodone and Augmentin. Home health was arranged for wound care. I will have him see Dr. Abbey Chatters in the next several weeks

## 2012-02-08 ENCOUNTER — Telehealth (INDEPENDENT_AMBULATORY_CARE_PROVIDER_SITE_OTHER): Payer: Self-pay | Admitting: General Surgery

## 2012-02-08 NOTE — Telephone Encounter (Signed)
Betsy, with [facility], calling to decline this case.  They do not accept [employer] at this time, so this will need to be reassigned to another home health agency, please.

## 2012-03-01 ENCOUNTER — Encounter (INDEPENDENT_AMBULATORY_CARE_PROVIDER_SITE_OTHER): Payer: Self-pay | Admitting: General Surgery

## 2012-03-01 ENCOUNTER — Ambulatory Visit (INDEPENDENT_AMBULATORY_CARE_PROVIDER_SITE_OTHER): Payer: Managed Care, Other (non HMO) | Admitting: General Surgery

## 2012-03-01 VITALS — BP 150/100 | HR 72 | Temp 98.1°F | Resp 18 | Ht 71.0 in | Wt 329.0 lb

## 2012-03-01 DIAGNOSIS — K612 Anorectal abscess: Secondary | ICD-10-CM

## 2012-03-01 NOTE — Progress Notes (Signed)
He is here for followup after incision and drainage of his recurrent left anal rectal abscess by Dr. Magnus Ivan about 3 weeks ago. He states this area healed a lot quicker and he has had no problems with this. He is not draining currently.  Exam: Left anal rectal area shows 2 scars both of which are healed and not draining. On digital rectal exam no masses are felt.  Assessment: Recurrent left anal rectal abscess-the recurrent area has completely healed. There is no way to tell now if he has a fistula.  Plan: If he starts having drainage from this wound or has any other abscess in this area, it will need to be drained and then he should be taken to the operating room for an exam under anesthesia within one to 2 weeks before the wound completely heals.

## 2012-03-01 NOTE — Patient Instructions (Signed)
Call us if the abscess comes back or if the area starts draining.

## 2013-01-23 ENCOUNTER — Other Ambulatory Visit: Payer: Self-pay | Admitting: Orthopedic Surgery

## 2013-01-23 DIAGNOSIS — M5410 Radiculopathy, site unspecified: Secondary | ICD-10-CM

## 2013-01-26 ENCOUNTER — Ambulatory Visit
Admission: RE | Admit: 2013-01-26 | Discharge: 2013-01-26 | Disposition: A | Discharge status: Home / Self Care | Payer: Managed Care, Other (non HMO) | Source: Ambulatory Visit | Attending: Orthopedic Surgery | Admitting: Orthopedic Surgery

## 2013-01-26 DIAGNOSIS — M5410 Radiculopathy, site unspecified: Secondary | ICD-10-CM

## 2013-02-13 ENCOUNTER — Ambulatory Visit: Payer: Managed Care, Other (non HMO) | Admitting: Neurology

## 2014-04-13 ENCOUNTER — Emergency Department (HOSPITAL_COMMUNITY)
Admission: EM | Admit: 2014-04-13 | Discharge: 2014-04-13 | Disposition: A | Discharge status: Home / Self Care | Payer: 59 | Attending: Emergency Medicine | Admitting: Emergency Medicine

## 2014-04-13 ENCOUNTER — Emergency Department (HOSPITAL_COMMUNITY): Payer: 59

## 2014-04-13 ENCOUNTER — Encounter (HOSPITAL_COMMUNITY): Payer: Self-pay | Admitting: Emergency Medicine

## 2014-04-13 DIAGNOSIS — I10 Essential (primary) hypertension: Secondary | ICD-10-CM | POA: Diagnosis not present

## 2014-04-13 DIAGNOSIS — Z872 Personal history of diseases of the skin and subcutaneous tissue: Secondary | ICD-10-CM | POA: Insufficient documentation

## 2014-04-13 DIAGNOSIS — Z79899 Other long term (current) drug therapy: Secondary | ICD-10-CM | POA: Insufficient documentation

## 2014-04-13 DIAGNOSIS — N289 Disorder of kidney and ureter, unspecified: Secondary | ICD-10-CM | POA: Diagnosis not present

## 2014-04-13 DIAGNOSIS — Z87891 Personal history of nicotine dependence: Secondary | ICD-10-CM | POA: Diagnosis not present

## 2014-04-13 DIAGNOSIS — G4730 Sleep apnea, unspecified: Secondary | ICD-10-CM | POA: Diagnosis not present

## 2014-04-13 DIAGNOSIS — R0602 Shortness of breath: Secondary | ICD-10-CM | POA: Diagnosis present

## 2014-04-13 DIAGNOSIS — R062 Wheezing: Secondary | ICD-10-CM | POA: Insufficient documentation

## 2014-04-13 LAB — BASIC METABOLIC PANEL
Anion gap: 16 — ABNORMAL HIGH (ref 5–15)
BUN: 7 mg/dL (ref 6–23)
CHLORIDE: 105 meq/L (ref 96–112)
CO2: 18 meq/L — AB (ref 19–32)
CREATININE: 1 mg/dL (ref 0.50–1.35)
Calcium: 8.5 mg/dL (ref 8.4–10.5)
GFR calc Af Amer: >90 mL/min (ref >90)
GFR calc non Af Amer: 85 mL/min — ABNORMAL LOW (ref >90)
GLUCOSE: 130 mg/dL — AB (ref 70–99)
Potassium: 4.3 mEq/L (ref 3.7–5.3)
Sodium: 139 mEq/L (ref 137–147)

## 2014-04-13 LAB — CBC
HEMATOCRIT: 40.2 % (ref 39.0–52.0)
HEMOGLOBIN: 12.8 g/dL — AB (ref 13.0–17.0)
MCH: 25.4 pg — ABNORMAL LOW (ref 26.0–34.0)
MCHC: 31.8 g/dL (ref 30.0–36.0)
MCV: 79.9 fL (ref 78.0–100.0)
Platelets: 218 10*3/uL (ref 150–400)
RBC: 5.03 MIL/uL (ref 4.22–5.81)
RDW: 15.4 % (ref 11.5–15.5)
WBC: 8.1 10*3/uL (ref 4.0–10.5)

## 2014-04-13 LAB — D-DIMER, QUANTITATIVE (NOT AT ARMC)

## 2014-04-13 LAB — I-STAT TROPONIN, ED
TROPONIN I, POC: 0.01 ng/mL (ref 0.00–0.08)
Troponin i, poc: 0.01 ng/mL (ref 0.00–0.08)

## 2014-04-13 LAB — PRO B NATRIURETIC PEPTIDE: Pro B Natriuretic peptide (BNP): 138.6 pg/mL — ABNORMAL HIGH (ref 0–125)

## 2014-04-13 MED ORDER — METHYLPREDNISOLONE SODIUM SUCC 125 MG IJ SOLR
125.0000 mg | Freq: Once | INTRAMUSCULAR | Status: AC
  Administered 2014-04-13: 125 mg via INTRAVENOUS
  Filled 2014-04-13: qty 2

## 2014-04-13 MED ORDER — PREDNISONE 20 MG PO TABS: 60.0000 mg | ORAL_TABLET | Freq: Every day | ORAL | Status: DC

## 2014-04-13 MED ORDER — ALBUTEROL SULFATE HFA 108 (90 BASE) MCG/ACT IN AERS
2.0000 | INHALATION_SPRAY | RESPIRATORY_TRACT | Status: DC | PRN
  Administered 2014-04-13: 2 via RESPIRATORY_TRACT
  Filled 2014-04-13: qty 6.7

## 2014-04-13 MED ORDER — PREDNISONE 10 MG PO TABS: 20.0000 mg | ORAL_TABLET | Freq: Every day | ORAL | Status: DC

## 2014-04-13 MED ORDER — ALBUTEROL (5 MG/ML) CONTINUOUS INHALATION SOLN
10.0000 mg/h | INHALATION_SOLUTION | RESPIRATORY_TRACT | Status: DC
  Administered 2014-04-13: 10 mg/h via RESPIRATORY_TRACT
  Filled 2014-04-13: qty 20

## 2014-04-13 NOTE — ED Notes (Signed)
Pt remains monitored by 12 lead, blood pressure, and pulse ox. Pts family remains at bedside.

## 2014-04-13 NOTE — ED Notes (Signed)
Pt ambulated approx 579ft pt with sats 97-100% on RA, pt became tachypneic, HR elevated to 130s.

## 2014-04-13 NOTE — ED Notes (Signed)
Pt arrives via EMS from home with sudden onset of SOB beginning approx 845 this Am. Wears cpap at night. Sleeps on several pillows at night. Pt has been needing to sit more upright to breath. Swelling noted to lower extremites. Pt with labored respirations, lung sounds diminished. Denies recent fever. Pt reports productive cough

## 2014-04-13 NOTE — Discharge Instructions (Signed)
You need to schedule a followup appointment with your primary care physician as you may need to have pulmonary function tests performed.   Cough, Adult  A cough is a reflex that helps clear your throat and airways. It can help heal the body or may be a reaction to an irritated airway. A cough may only last 2 or 3 weeks (acute) or may last more than 8 weeks (chronic).  CAUSES Acute cough:  Viral or bacterial infections. Chronic cough:  Infections.  Allergies.  Asthma.  Post-nasal drip.  Smoking.  Heartburn or acid reflux.  Some medicines.  Chronic lung problems (COPD).  Cancer. SYMPTOMS   Cough.  Fever.  Chest pain.  Increased breathing rate.  High-pitched whistling sound when breathing (wheezing).  Colored mucus that you cough up (sputum). TREATMENT   A bacterial cough may be treated with antibiotic medicine.  A viral cough must run its course and will not respond to antibiotics.  Your caregiver may recommend other treatments if you have a chronic cough. HOME CARE INSTRUCTIONS   Only take over-the-counter or prescription medicines for pain, discomfort, or fever as directed by your caregiver. Use cough suppressants only as directed by your caregiver.  Use a cold steam vaporizer or humidifier in your bedroom or home to help loosen secretions.  Sleep in a semi-upright position if your cough is worse at night.  Rest as needed.  Stop smoking if you smoke. SEEK IMMEDIATE MEDICAL CARE IF:   You have pus in your sputum.  Your cough starts to worsen.  You cannot control your cough with suppressants and are losing sleep.  You begin coughing up blood.  You have difficulty breathing.  You develop pain which is getting worse or is uncontrolled with medicine.  You have a fever. MAKE SURE YOU:   Understand these instructions.  Will watch your condition.  Will get help right away if you are not doing well or get worse. Document Released: 02/20/2011  Document Revised: 11/16/2011 Document Reviewed: 02/20/2011 [facility] Patient Information 2015 [place], [place]. This information is not intended to replace advice given to you by your health care provider. Make sure you discuss any questions you have with your health care provider.  How to Use an Inhaler Proper inhaler technique is very important. Good technique ensures that the medicine reaches the lungs. Poor technique results in depositing the medicine on the tongue and back of the throat rather than in the airways. If you do not use the inhaler with good technique, the medicine will not help you. STEPS TO FOLLOW IF USING AN INHALER WITHOUT AN EXTENSION TUBE 1. Remove the cap from the inhaler. 2. If you are using the inhaler for the first time, you will need to prime it. Shake the inhaler for 5 seconds and release four puffs into the air, away from your face. Ask your health care provider or pharmacist if you have questions about priming your inhaler. 3. Shake the inhaler for 5 seconds before each breath in (inhalation). 4. Position the inhaler so that the top of the canister faces up. 5. Put your index finger on the top of the medicine canister. Your thumb supports the bottom of the inhaler. 6. Open your mouth. 7. Either place the inhaler between your teeth and place your lips tightly around the mouthpiece, or hold the inhaler 1-2 inches away from your open mouth. If you are unsure of which technique to use, ask your health care provider. 8. Breathe out (exhale) normally and as  completely as possible. 9. Press the canister down with your index finger to release the medicine. 10. At the same time as the canister is pressed, inhale deeply and slowly until your lungs are completely filled. This should take 4-6 seconds. Keep your tongue down. 11. Hold the medicine in your lungs for 5-10 seconds (10 seconds is best). This helps the medicine get into the small airways of your lungs. 12. Breathe out  slowly, through pursed lips. Whistling is an example of pursed lips. 13. Wait at least 15-30 seconds between puffs. Continue with the above steps until you have taken the number of puffs your health care provider has ordered. Do not use the inhaler more than your health care provider tells you. 14. Replace the cap on the inhaler. 15. Follow the directions from your health care provider or the inhaler insert for cleaning the inhaler. STEPS TO FOLLOW IF USING AN INHALER WITH AN EXTENSION (SPACER) 1. Remove the cap from the inhaler. 2. If you are using the inhaler for the first time, you will need to prime it. Shake the inhaler for 5 seconds and release four puffs into the air, away from your face. Ask your health care provider or pharmacist if you have questions about priming your inhaler. 3. Shake the inhaler for 5 seconds before each breath in (inhalation). 4. Place the open end of the spacer onto the mouthpiece of the inhaler. 5. Position the inhaler so that the top of the canister faces up and the spacer mouthpiece faces you. 6. Put your index finger on the top of the medicine canister. Your thumb supports the bottom of the inhaler and the spacer. 7. Breathe out (exhale) normally and as completely as possible. 8. Immediately after exhaling, place the spacer between your teeth and into your mouth. Close your lips tightly around the spacer. 9. Press the canister down with your index finger to release the medicine. 10. At the same time as the canister is pressed, inhale deeply and slowly until your lungs are completely filled. This should take 4-6 seconds. Keep your tongue down and out of the way. 11. Hold the medicine in your lungs for 5-10 seconds (10 seconds is best). This helps the medicine get into the small airways of your lungs. Exhale. 12. Repeat inhaling deeply through the spacer mouthpiece. Again hold that breath for up to 10 seconds (10 seconds is best). Exhale slowly. If it is difficult to  take this second deep breath through the spacer, breathe normally several times through the spacer. Remove the spacer from your mouth. 13. Wait at least 15-30 seconds between puffs. Continue with the above steps until you have taken the number of puffs your health care provider has ordered. Do not use the inhaler more than your health care provider tells you. 14. Remove the spacer from the inhaler, and place the cap on the inhaler. 15. Follow the directions from your health care provider or the inhaler insert for cleaning the inhaler and spacer. If you are using different kinds of inhalers, use your quick relief medicine to open the airways 10-15 minutes before using a steroid if instructed to do so by your health care provider. If you are unsure which inhalers to use and the order of using them, ask your health care provider, nurse, or respiratory therapist. If you are using a steroid inhaler, always rinse your mouth with water after your last puff, then gargle and spit out the water. Do not swallow the water. AVOID:  Inhaling before or after starting the spray of medicine. It takes practice to coordinate your breathing with triggering the spray.  Inhaling through the nose (rather than the mouth) when triggering the spray. HOW TO DETERMINE IF YOUR INHALER IS FULL OR NEARLY EMPTY You cannot know when an inhaler is empty by shaking it. A few inhalers are now being made with dose counters. Ask your health care provider for a prescription that has a dose counter if you feel you need that extra help. If your inhaler does not have a counter, ask your health care provider to help you determine the date you need to refill your inhaler. Write the refill date on a calendar or your inhaler canister. Refill your inhaler 7-10 days before it runs out. Be sure to keep an adequate supply of medicine. This includes making sure it is not expired, and that you have a spare inhaler.  SEEK MEDICAL CARE IF:   Your  symptoms are only partially relieved with your inhaler.  You are having trouble using your inhaler.  You have some increase in phlegm. SEEK IMMEDIATE MEDICAL CARE IF:   You feel little or no relief with your inhalers. You are still wheezing and are feeling shortness of breath or tightness in your chest or both.  You have dizziness, headaches, or a fast heart rate.  You have chills, fever, or night sweats.  You have a noticeable increase in phlegm production, or there is blood in the phlegm. MAKE SURE YOU:   Understand these instructions.  Will watch your condition.  Will get help right away if you are not doing well or get worse. Document Released: 08/21/2000 Document Revised: 06/14/2013 Document Reviewed: 03/23/2013 [facility] Patient Information 2015 [place], [place]. This information is not intended to replace advice given to you by your health care provider. Make sure you discuss any questions you have with your health care provider.   Possible Chronic Obstructive Pulmonary Disease Chronic obstructive pulmonary disease (COPD) is a common lung condition in which airflow from the lungs is limited. COPD is a general term that can be used to describe many different lung problems that limit airflow, including both chronic bronchitis and emphysema. If you have COPD, your lung function will probably never return to normal, but there are measures you can take to improve lung function and make yourself feel better.  CAUSES   Smoking (common).   Exposure to secondhand smoke.   Genetic problems.  Chronic inflammatory lung diseases or recurrent infections. SYMPTOMS   Shortness of breath, especially with physical activity.   Deep, persistent (chronic) cough with a large amount of thick mucus.   Wheezing.   Rapid breaths (tachypnea).   Gray or bluish discoloration (cyanosis) of the skin, especially in fingers, toes, or lips.   Fatigue.   Weight loss.   Frequent  infections or episodes when breathing symptoms become much worse (exacerbations).   Chest tightness. DIAGNOSIS  Your health care provider will take a medical history and perform a physical examination to make the initial diagnosis. Additional tests for COPD may include:   Lung (pulmonary) function tests.  Chest X-ray.  CT scan.  Blood tests. TREATMENT  Treatment available to help you feel better when you have COPD includes:   Inhaler and nebulizer medicines. These help manage the symptoms of COPD and make your breathing more comfortable.  Supplemental oxygen. Supplemental oxygen is only helpful if you have a low oxygen level in your blood.   Exercise and physical activity. These  are beneficial for nearly all people with COPD. Some people may also benefit from a pulmonary rehabilitation program. HOME CARE INSTRUCTIONS   Take all medicines (inhaled or pills) as directed by your health care provider.  Avoid over-the-counter medicines or cough syrups that dry up your airway (such as antihistamines) and slow down the elimination of secretions unless instructed otherwise by your health care provider.   If you are a smoker, the most important thing that you can do is stop smoking. Continuing to smoke will cause further lung damage and breathing trouble. Ask your health care provider for help with quitting smoking. He or she can direct you to community resources or hospitals that provide support.  Avoid exposure to irritants such as smoke, chemicals, and fumes that aggravate your breathing.  Use oxygen therapy and pulmonary rehabilitation if directed by your health care provider. If you require home oxygen therapy, ask your health care provider whether you should purchase a pulse oximeter to measure your oxygen level at home.   Avoid contact with individuals who have a contagious illness.  Avoid extreme temperature and humidity changes.  Eat healthy foods. Eating smaller, more  frequent meals and resting before meals may help you maintain your strength.  Stay active, but balance activity with periods of rest. Exercise and physical activity will help you maintain your ability to do things you want to do.  Preventing infection and hospitalization is very important when you have COPD. Make sure to receive all the vaccines your health care provider recommends, especially the pneumococcal and influenza vaccines. Ask your health care provider whether you need a pneumonia vaccine.  Learn and use relaxation techniques to manage stress.  Learn and use controlled breathing techniques as directed by your health care provider. Controlled breathing techniques include:   Pursed lip breathing. Start by breathing in (inhaling) through your nose for 1 second. Then, purse your lips as if you were going to whistle and breathe out (exhale) through the pursed lips for 2 seconds.   Diaphragmatic breathing. Start by putting one hand on your abdomen just above your waist. Inhale slowly through your nose. The hand on your abdomen should move out. Then purse your lips and exhale slowly. You should be able to feel the hand on your abdomen moving in as you exhale.   Learn and use controlled coughing to clear mucus from your lungs. Controlled coughing is a series of short, progressive coughs. The steps of controlled coughing are:  1. Lean your head slightly forward.  2. Breathe in deeply using diaphragmatic breathing.  3. Try to hold your breath for 3 seconds.  4. Keep your mouth slightly open while coughing twice.  5. Spit any mucus out into a tissue.  6. Rest and repeat the steps once or twice as needed. SEEK MEDICAL CARE IF:   You are coughing up more mucus than usual.   There is a change in the color or thickness of your mucus.   Your breathing is more labored than usual.   Your breathing is faster than usual.  SEEK IMMEDIATE MEDICAL CARE IF:   You have shortness of  breath while you are resting.   You have shortness of breath that prevents you from:  Being able to talk.   Performing your usual physical activities.   You have chest pain lasting longer than 5 minutes.   Your skin color is more cyanotic than usual.  You measure low oxygen saturations for longer than 5 minutes with a  pulse oximeter. MAKE SURE YOU:   Understand these instructions.  Will watch your condition.  Will get help right away if you are not doing well or get worse. Document Released: 06/03/2005 Document Revised: 01/08/2014 Document Reviewed: 04/20/2013 [facility] Patient Information 2015 [place], [place]. This information is not intended to replace advice given to you by your health care provider. Make sure you discuss any questions you have with your health care provider.

## 2014-04-13 NOTE — ED Provider Notes (Signed)
TIME SEEN: 10:30 AM  CHIEF COMPLAINT: Shortness of breath  HPI: Patient is a 53 y.o. M with history of hypertension, sleep apnea and presents emergency department with shortness of breath that started at approximately 8:00 this morning. He states that he has to wear CPAP at night and sleep on several pillows. He states today he feels like he needs to sit upright to breathe better. He denies any chest pain or chest discomfort. No lower extremity swelling or pain. No fevers but has had a nonproductive cough which he reports is chronic. No change in his sputum production. Denies a history of CAD, CHF, PE or DVT. Patient reports that he was a smoker but quit in 1997. He used to have a history of asthma. ROS: See HPI Constitutional: no fever  Eyes: no drainage  ENT: no runny nose   Cardiovascular:  no chest pain  Resp: SOB  GI: no vomiting GU: no dysuria Integumentary: no rash  Allergy: no hives  Musculoskeletal: no leg swelling  Neurological: no slurred speech ROS otherwise negative  PAST MEDICAL HISTORY/PAST SURGICAL HISTORY:  Past Medical History  Diagnosis Date  . Hypertension   . Abscess of anal and rectal regions   . Acute renal insufficiency   . Hemorrhoids, internal   . Sleep apnea   . 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 Authorizing Provider  benzonatate (TESSALON) 200 MG capsule as needed. 12/21/11   Historical Provider, MD  cefdinir (OMNICEF) 300 MG capsule 3 tablets daily for 10 days 12/21/11   Historical Provider, MD  Lancets (FREESTYLE) lancets  06/22/11   Historical Provider, MD  ramipril (ALTACE) 5 MG capsule daily. 07/23/11   Historical Provider, MD    ALLERGIES:  Allergies  Allergen Reactions  . Shellfish Allergy     Upsets pt's stomach- vomiting and diarrhea    SOCIAL HISTORY:  History  Substance Use Topics  . Smoking status: Former Smoker    Types:  Cigarettes    Quit date: 12/15/1995  . Smokeless tobacco: Never Used  . Alcohol Use: 1.0 oz/week    2 drink(s) per week    FAMILY HISTORY: Family History  Problem Relation Age of Onset  . Diabetes Father   . Heart disease Father   . Heart disease Paternal Grandmother   . Diabetes Mother   . Colon cancer Neg Hx   . Esophageal cancer Neg Hx   . Rectal cancer Neg Hx   . Stomach cancer Neg Hx     EXAM: BP 160/106  Pulse 106  Temp(Src) 99 F (37.2 C) (Oral)  Resp 36  Wt 328 lb (148.78 kg)  SpO2 98% CONSTITUTIONAL: Alert and oriented and responds appropriately to questions. Well-appearing; well-nourished HEAD: Normocephalic EYES: Conjunctivae clear, PERRL ENT: normal nose; no rhinorrhea; moist mucous membranes; pharynx without lesions noted NECK: Supple, no meningismus, no LAD  CARD: RRR; S1 and S2 appreciated; no murmurs, no clicks, no rubs, no gallops RESP: Normal chest excursion without splinting, patient is mildly tachypneic and speaking short sentences, mild crackles at his bases bilaterally and minimal expiratory wheeze, no rhonchi, no respiratory distress ABD/GI: Normal bowel sounds; non-distended; soft, non-tender, no rebound, no guarding BACK:  The back appears normal and is non-tender to palpation, there is no CVA tenderness EXT: Normal ROM in all joints; non-tender to palpation; no edema; normal capillary refill; no cyanosis    SKIN:  Normal color for age and race; warm NEURO: Moves all extremities equally PSYCH: The patient's mood and manner are appropriate. Grooming and personal hygiene are appropriate.  MEDICAL DECISION MAKING: Patient here with shortness of breath. He is tachypneic but not hypoxic. Differential diagnosis includes ACS, pneumonia, CHF exacerbation, PE. We'll obtain labs including troponin, BNP, d-dimer and chest x-ray. We'll place on oxygen for comfort.  ED PROGRESS: Patient reports feeling much better on oxygen. His labs are unremarkable including  negative troponin, negative d-dimer and a BNP of 138.6. Chest x-ray is clear. EKG shows mild sinus tachycardia but no other arrhythmia or ischemic changes. We'll give albuterol treatment, Solu-Medrol. We'll ambulate after treatment is completed. We'll repeat troponin.     Patient's second troponin that was 6 hours after onset of symptoms is negative. He has been able to ambulate without hypoxia even before his breathing treatment. He states he is feeling much better after albuterol. Given he has completely resolved. His lungs are now clear to auscultation bilaterally. Suspect mild COPD versus asthma exacerbation. He has a PCP for followup. We'll discharge home on prednisone and with albuterol inhaler. Have discussed strict return precautions and supportive care instructions. Patient verbalizes understanding and is comfortable with plan.    EKG Interpretation  Date/Time:  Friday April 13 2014 10:18:36 EDT Ventricular Rate:  102 PR Interval:  176 QRS Duration: 83 QT Interval:  349 QTC Calculation: 455 R Axis:   58 Text Interpretation:  Sinus tachycardia Confirmed by Rosalene Wardrop,  DO, Sonali Wivell ([id number]) on 04/13/2014 10:31:50 AM        Delice Bison Blakely Maranan, DO 04/13/14 [id number]

## 2014-04-13 NOTE — ED Notes (Signed)
pts vitals updated and IV removed. Pt getting dressed for discharge and awaiting at bedside for discharge paperwork at bedside.

## 2014-04-13 NOTE — ED Notes (Signed)
Pt continues to be monitored by 12 lead, blood pressure, and pulse ox. Pt remains on 2L via . Family at bedside.

## 2014-04-25 ENCOUNTER — Other Ambulatory Visit (HOSPITAL_COMMUNITY): Payer: Self-pay | Admitting: Respiratory Therapy

## 2014-04-25 DIAGNOSIS — J45909 Unspecified asthma, uncomplicated: Secondary | ICD-10-CM

## 2014-05-02 ENCOUNTER — Ambulatory Visit (HOSPITAL_COMMUNITY)
Admission: RE | Admit: 2014-05-02 | Discharge: 2014-05-02 | Disposition: A | Discharge status: Home / Self Care | Payer: 59 | Source: Ambulatory Visit | Attending: Internal Medicine | Admitting: Internal Medicine

## 2014-05-02 DIAGNOSIS — J45909 Unspecified asthma, uncomplicated: Secondary | ICD-10-CM | POA: Diagnosis present

## 2014-05-02 LAB — PULMONARY FUNCTION TEST
DL/VA % pred: 125 %
DL/VA: 5.91 ml/min/mmHg/L
DLCO cor % pred: 63 %
DLCO cor: 21.4 ml/min/mmHg
DLCO unc % pred: 63 %
DLCO unc: 21.4 ml/min/mmHg
FEF 25-75 POST: 1.58 L/s
FEF 25-75 Pre: 2 L/sec
FEF2575-%Change-Post: -21 %
FEF2575-%Pred-Post: 46 %
FEF2575-%Pred-Pre: 59 %
FEV1-%Change-Post: -8 %
FEV1-%PRED-POST: 57 %
FEV1-%Pred-Pre: 62 %
FEV1-POST: 1.98 L
FEV1-Pre: 2.16 L
FEV1FVC-%CHANGE-POST: -5 %
FEV1FVC-%Pred-Pre: 95 %
FEV6-%Change-Post: -2 %
FEV6-%Pred-Post: 64 %
FEV6-%Pred-Pre: 66 %
FEV6-Post: 2.72 L
FEV6-Pre: 2.79 L
FEV6FVC-%Change-Post: 0 %
FEV6FVC-%Pred-Post: 103 %
FEV6FVC-%Pred-Pre: 102 %
FVC-%CHANGE-POST: -3 %
FVC-%Pred-Post: 62 %
FVC-%Pred-Pre: 64 %
FVC-PRE: 2.81 L
FVC-Post: 2.72 L
PRE FEV1/FVC RATIO: 77 %
Post FEV1/FVC ratio: 73 %
Post FEV6/FVC ratio: 100 %
Pre FEV6/FVC Ratio: 99 %
RV % pred: 104 %
RV: 2.2 L
TLC % pred: 72 %
TLC: 5.19 L

## 2014-05-02 MED ORDER — ALBUTEROL SULFATE (2.5 MG/3ML) 0.083% IN NEBU
2.5000 mg | INHALATION_SOLUTION | Freq: Once | RESPIRATORY_TRACT | Status: AC
  Administered 2014-05-02: 2.5 mg via RESPIRATORY_TRACT

## 2014-11-13 ENCOUNTER — Emergency Department (HOSPITAL_COMMUNITY): Payer: 59

## 2014-11-13 ENCOUNTER — Encounter (HOSPITAL_COMMUNITY): Payer: Self-pay | Admitting: Emergency Medicine

## 2014-11-13 ENCOUNTER — Inpatient Hospital Stay (HOSPITAL_COMMUNITY)
Admission: EM | Admit: 2014-11-13 | Discharge: 2014-11-15 | DRG: 304 | Disposition: A | Discharge status: Home / Self Care | Payer: 59 | Attending: Internal Medicine | Admitting: Internal Medicine

## 2014-11-13 DIAGNOSIS — J9811 Atelectasis: Secondary | ICD-10-CM | POA: Diagnosis present

## 2014-11-13 DIAGNOSIS — Z91013 Allergy to seafood: Secondary | ICD-10-CM

## 2014-11-13 DIAGNOSIS — Z833 Family history of diabetes mellitus: Secondary | ICD-10-CM

## 2014-11-13 DIAGNOSIS — I10 Essential (primary) hypertension: Principal | ICD-10-CM | POA: Diagnosis present

## 2014-11-13 DIAGNOSIS — Z79899 Other long term (current) drug therapy: Secondary | ICD-10-CM | POA: Diagnosis not present

## 2014-11-13 DIAGNOSIS — Z87891 Personal history of nicotine dependence: Secondary | ICD-10-CM | POA: Diagnosis not present

## 2014-11-13 DIAGNOSIS — K760 Fatty (change of) liver, not elsewhere classified: Secondary | ICD-10-CM | POA: Diagnosis present

## 2014-11-13 DIAGNOSIS — F329 Major depressive disorder, single episode, unspecified: Secondary | ICD-10-CM | POA: Diagnosis present

## 2014-11-13 DIAGNOSIS — G4733 Obstructive sleep apnea (adult) (pediatric): Secondary | ICD-10-CM | POA: Diagnosis present

## 2014-11-13 DIAGNOSIS — E119 Type 2 diabetes mellitus without complications: Secondary | ICD-10-CM | POA: Diagnosis present

## 2014-11-13 DIAGNOSIS — Z7952 Long term (current) use of systemic steroids: Secondary | ICD-10-CM

## 2014-11-13 DIAGNOSIS — I169 Hypertensive crisis, unspecified: Secondary | ICD-10-CM

## 2014-11-13 DIAGNOSIS — Z8249 Family history of ischemic heart disease and other diseases of the circulatory system: Secondary | ICD-10-CM | POA: Diagnosis not present

## 2014-11-13 DIAGNOSIS — Z6841 Body Mass Index (BMI) 40.0 and over, adult: Secondary | ICD-10-CM | POA: Diagnosis not present

## 2014-11-13 DIAGNOSIS — R0609 Other forms of dyspnea: Secondary | ICD-10-CM | POA: Diagnosis present

## 2014-11-13 DIAGNOSIS — J45909 Unspecified asthma, uncomplicated: Secondary | ICD-10-CM | POA: Diagnosis present

## 2014-11-13 DIAGNOSIS — N289 Disorder of kidney and ureter, unspecified: Secondary | ICD-10-CM | POA: Diagnosis present

## 2014-11-13 DIAGNOSIS — J810 Acute pulmonary edema: Secondary | ICD-10-CM | POA: Diagnosis present

## 2014-11-13 DIAGNOSIS — R0600 Dyspnea, unspecified: Secondary | ICD-10-CM | POA: Diagnosis not present

## 2014-11-13 DIAGNOSIS — R0602 Shortness of breath: Secondary | ICD-10-CM

## 2014-11-13 DIAGNOSIS — M179 Osteoarthritis of knee, unspecified: Secondary | ICD-10-CM | POA: Diagnosis present

## 2014-11-13 DIAGNOSIS — R609 Edema, unspecified: Secondary | ICD-10-CM

## 2014-11-13 HISTORY — DX: Unspecified asthma, uncomplicated: J45.909

## 2014-11-13 LAB — CBC
HCT: 41.9 % (ref 39.0–52.0)
Hemoglobin: 13.5 g/dL (ref 13.0–17.0)
MCH: 26.8 pg (ref 26.0–34.0)
MCHC: 32.2 g/dL (ref 30.0–36.0)
MCV: 83.1 fL (ref 78.0–100.0)
Platelets: 227 10*3/uL (ref 150–400)
RBC: 5.04 MIL/uL (ref 4.22–5.81)
RDW: 16.1 % — ABNORMAL HIGH (ref 11.5–15.5)
WBC: 11.7 10*3/uL — AB (ref 4.0–10.5)

## 2014-11-13 LAB — I-STAT CHEM 8, ED
BUN: 9 mg/dL (ref 6–23)
CHLORIDE: 103 mmol/L (ref 96–112)
CREATININE: 1.1 mg/dL (ref 0.50–1.35)
Calcium, Ion: 1.08 mmol/L — ABNORMAL LOW (ref 1.12–1.23)
GLUCOSE: 138 mg/dL — AB (ref 70–99)
HCT: 45 % (ref 39.0–52.0)
Hemoglobin: 15.3 g/dL (ref 13.0–17.0)
POTASSIUM: 4.5 mmol/L (ref 3.5–5.1)
Sodium: 140 mmol/L (ref 135–145)
TCO2: 23 mmol/L (ref 0–100)

## 2014-11-13 LAB — CBC WITH DIFFERENTIAL/PLATELET
BASOS PCT: 0 % (ref 0–1)
Basophils Absolute: 0 10*3/uL (ref 0.0–0.1)
Eosinophils Absolute: 0.1 10*3/uL (ref 0.0–0.7)
Eosinophils Relative: 1 % (ref 0–5)
HCT: 41.6 % (ref 39.0–52.0)
HEMOGLOBIN: 13.3 g/dL (ref 13.0–17.0)
Lymphocytes Relative: 15 % (ref 12–46)
Lymphs Abs: 1.5 10*3/uL (ref 0.7–4.0)
MCH: 27 pg (ref 26.0–34.0)
MCHC: 32 g/dL (ref 30.0–36.0)
MCV: 84.6 fL (ref 78.0–100.0)
MONOS PCT: 6 % (ref 3–12)
Monocytes Absolute: 0.6 10*3/uL (ref 0.1–1.0)
NEUTROS ABS: 7.8 10*3/uL — AB (ref 1.7–7.7)
NEUTROS PCT: 78 % — AB (ref 43–77)
Platelets: 217 10*3/uL (ref 150–400)
RBC: 4.92 MIL/uL (ref 4.22–5.81)
RDW: 16.1 % — ABNORMAL HIGH (ref 11.5–15.5)
WBC: 10 10*3/uL (ref 4.0–10.5)

## 2014-11-13 LAB — GLUCOSE, CAPILLARY: GLUCOSE-CAPILLARY: 134 mg/dL — AB (ref 70–99)

## 2014-11-13 LAB — RAPID URINE DRUG SCREEN, HOSP PERFORMED
Amphetamines: NOT DETECTED
BARBITURATES: NOT DETECTED
Benzodiazepines: NOT DETECTED
Cocaine: NOT DETECTED
Opiates: NOT DETECTED
Tetrahydrocannabinol: NOT DETECTED

## 2014-11-13 LAB — MRSA PCR SCREENING: MRSA BY PCR: NEGATIVE

## 2014-11-13 LAB — CREATININE, SERUM
Creatinine, Ser: 1.11 mg/dL (ref 0.50–1.35)
GFR calc Af Amer: 87 mL/min — ABNORMAL LOW (ref >90)
GFR calc non Af Amer: 75 mL/min — ABNORMAL LOW (ref >90)

## 2014-11-13 LAB — PROTIME-INR
INR: 1.08 (ref 0.00–1.49)
Prothrombin Time: 14.1 seconds (ref 11.6–15.2)

## 2014-11-13 LAB — CBG MONITORING, ED: GLUCOSE-CAPILLARY: 124 mg/dL — AB (ref 70–99)

## 2014-11-13 LAB — I-STAT TROPONIN, ED: Troponin i, poc: 0.01 ng/mL (ref 0.00–0.08)

## 2014-11-13 LAB — BRAIN NATRIURETIC PEPTIDE: B NATRIURETIC PEPTIDE 5: 105.2 pg/mL — AB (ref 0.0–100.0)

## 2014-11-13 MED ORDER — ZOLPIDEM TARTRATE 5 MG PO TABS
5.0000 mg | ORAL_TABLET | Freq: Every day | ORAL | Status: DC
  Administered 2014-11-14: 5 mg via ORAL
  Filled 2014-11-13: qty 1

## 2014-11-13 MED ORDER — INSULIN ASPART 100 UNIT/ML ~~LOC~~ SOLN
0.0000 [IU] | Freq: Three times a day (TID) | SUBCUTANEOUS | Status: DC
  Administered 2014-11-14 – 2014-11-15 (×3): 2 [IU] via SUBCUTANEOUS

## 2014-11-13 MED ORDER — ASPIRIN EC 325 MG PO TBEC
325.0000 mg | DELAYED_RELEASE_TABLET | Freq: Every day | ORAL | Status: DC
  Administered 2014-11-14 – 2014-11-15 (×2): 325 mg via ORAL
  Filled 2014-11-13 (×2): qty 1

## 2014-11-13 MED ORDER — NITROGLYCERIN IN D5W 200-5 MCG/ML-% IV SOLN: 0.0000 ug/min | INTRAVENOUS | Status: DC

## 2014-11-13 MED ORDER — SODIUM CHLORIDE 0.9 % IJ SOLN
3.0000 mL | Freq: Two times a day (BID) | INTRAMUSCULAR | Status: DC
  Administered 2014-11-13 – 2014-11-15 (×4): 3 mL via INTRAVENOUS

## 2014-11-13 MED ORDER — ACETAMINOPHEN 325 MG PO TABS: 650.0000 mg | ORAL_TABLET | Freq: Four times a day (QID) | ORAL | Status: DC | PRN

## 2014-11-13 MED ORDER — IOHEXOL 350 MG/ML SOLN
80.0000 mL | Freq: Once | INTRAVENOUS | Status: AC | PRN
Start: 2014-11-13 — End: 2014-11-13
  Administered 2014-11-13: 80 mL via INTRAVENOUS

## 2014-11-13 MED ORDER — POLYETHYLENE GLYCOL 3350 17 G PO PACK
17.0000 g | PACK | Freq: Every day | ORAL | Status: DC | PRN
  Filled 2014-11-13: qty 1

## 2014-11-13 MED ORDER — FUROSEMIDE 10 MG/ML IJ SOLN
40.0000 mg | Freq: Two times a day (BID) | INTRAMUSCULAR | Status: DC
  Administered 2014-11-13 – 2014-11-14 (×2): 40 mg via INTRAVENOUS
  Filled 2014-11-13 (×4): qty 4

## 2014-11-13 MED ORDER — SODIUM CHLORIDE 0.9 % IJ SOLN: 3.0000 mL | INTRAMUSCULAR | Status: DC | PRN

## 2014-11-13 MED ORDER — ALBUTEROL SULFATE (2.5 MG/3ML) 0.083% IN NEBU
2.5000 mg | INHALATION_SOLUTION | RESPIRATORY_TRACT | Status: DC | PRN
  Administered 2014-11-14: 2.5 mg via RESPIRATORY_TRACT
  Filled 2014-11-13: qty 3

## 2014-11-13 MED ORDER — ONDANSETRON HCL 4 MG PO TABS
4.0000 mg | ORAL_TABLET | Freq: Four times a day (QID) | ORAL | Status: DC | PRN
Start: 2014-11-13 — End: 2014-11-15

## 2014-11-13 MED ORDER — AMLODIPINE BESYLATE 10 MG PO TABS
10.0000 mg | ORAL_TABLET | Freq: Every day | ORAL | Status: DC
Start: 2014-11-13 — End: 2014-11-15
  Administered 2014-11-13 – 2014-11-15 (×3): 10 mg via ORAL
  Filled 2014-11-13 (×3): qty 1

## 2014-11-13 MED ORDER — NITROGLYCERIN IN D5W 200-5 MCG/ML-% IV SOLN
5.0000 ug/min | Freq: Once | INTRAVENOUS | Status: AC
Start: 2014-11-13 — End: 2014-11-13
  Administered 2014-11-13: 5 ug/min via INTRAVENOUS
  Filled 2014-11-13: qty 250

## 2014-11-13 MED ORDER — RAMIPRIL 10 MG PO CAPS
10.0000 mg | ORAL_CAPSULE | Freq: Two times a day (BID) | ORAL | Status: DC
  Administered 2014-11-13 – 2014-11-15 (×4): 10 mg via ORAL
  Filled 2014-11-13 (×6): qty 1

## 2014-11-13 MED ORDER — ASPIRIN 81 MG PO CHEW
324.0000 mg | CHEWABLE_TABLET | Freq: Once | ORAL | Status: AC
  Administered 2014-11-13: 324 mg via ORAL
  Filled 2014-11-13: qty 4

## 2014-11-13 MED ORDER — HYDROCODONE-ACETAMINOPHEN 5-325 MG PO TABS
1.0000 | ORAL_TABLET | ORAL | Status: DC | PRN
  Administered 2014-11-13 – 2014-11-15 (×2): 1 via ORAL
  Filled 2014-11-13 (×2): qty 1

## 2014-11-13 MED ORDER — TRAZODONE HCL 50 MG PO TABS
50.0000 mg | ORAL_TABLET | Freq: Every evening | ORAL | Status: DC
  Administered 2014-11-13: 50 mg via ORAL
  Filled 2014-11-13 (×3): qty 1

## 2014-11-13 MED ORDER — ALBUTEROL SULFATE HFA 108 (90 BASE) MCG/ACT IN AERS: 2.0000 | INHALATION_SPRAY | RESPIRATORY_TRACT | Status: DC | PRN

## 2014-11-13 MED ORDER — NITROGLYCERIN IN D5W 200-5 MCG/ML-% IV SOLN: 5.0000 ug/min | Freq: Once | INTRAVENOUS | Status: DC

## 2014-11-13 MED ORDER — SODIUM CHLORIDE 0.9 % IV SOLN: 250.0000 mL | INTRAVENOUS | Status: DC | PRN

## 2014-11-13 MED ORDER — ENOXAPARIN SODIUM 40 MG/0.4ML ~~LOC~~ SOLN
40.0000 mg | SUBCUTANEOUS | Status: DC
  Administered 2014-11-13 – 2014-11-14 (×2): 40 mg via SUBCUTANEOUS
  Filled 2014-11-13 (×3): qty 0.4

## 2014-11-13 MED ORDER — FUROSEMIDE 10 MG/ML IJ SOLN
40.0000 mg | Freq: Once | INTRAMUSCULAR | Status: AC
  Administered 2014-11-13: 40 mg via INTRAVENOUS
  Filled 2014-11-13: qty 4

## 2014-11-13 MED ORDER — ACETAMINOPHEN 650 MG RE SUPP: 650.0000 mg | Freq: Four times a day (QID) | RECTAL | Status: DC | PRN

## 2014-11-13 MED ORDER — ATORVASTATIN CALCIUM 10 MG PO TABS
10.0000 mg | ORAL_TABLET | Freq: Every day | ORAL | Status: DC
  Administered 2014-11-13 – 2014-11-14 (×2): 10 mg via ORAL
  Filled 2014-11-13 (×3): qty 1

## 2014-11-13 MED ORDER — INFLUENZA VAC SPLIT QUAD 0.5 ML IM SUSY
0.5000 mL | PREFILLED_SYRINGE | INTRAMUSCULAR | Status: DC
Start: 2014-11-14 — End: 2014-11-15
  Filled 2014-11-13: qty 0.5

## 2014-11-13 MED ORDER — DOCUSATE SODIUM 100 MG PO CAPS
100.0000 mg | ORAL_CAPSULE | Freq: Two times a day (BID) | ORAL | Status: DC
  Administered 2014-11-14 – 2014-11-15 (×2): 100 mg via ORAL
  Filled 2014-11-13 (×4): qty 1

## 2014-11-13 MED ORDER — ONE-DAILY MULTI VITAMINS PO TABS: 1.0000 | ORAL_TABLET | Freq: Every day | ORAL | Status: DC

## 2014-11-13 MED ORDER — ADULT MULTIVITAMIN W/MINERALS CH
1.0000 | ORAL_TABLET | Freq: Every day | ORAL | Status: DC
  Administered 2014-11-14 – 2014-11-15 (×2): 1 via ORAL
  Filled 2014-11-13 (×2): qty 1

## 2014-11-13 MED ORDER — ONDANSETRON HCL 4 MG/2ML IJ SOLN
4.0000 mg | Freq: Four times a day (QID) | INTRAMUSCULAR | Status: DC | PRN
  Administered 2014-11-13: 4 mg via INTRAVENOUS
  Filled 2014-11-13: qty 2

## 2014-11-13 NOTE — ED Notes (Signed)
Sudden onset of shortness of breath started this am -- pt received 2 resp treatments per EMS, remains short of breath and diaphoretic.

## 2014-11-13 NOTE — ED Provider Notes (Signed)
CSN: [id number]     Arrival date & time 11/13/14  1011 History   First MD Initiated Contact with Patient 11/13/14 1016     Chief Complaint  Patient presents with  . Shortness of Breath     (Consider location/radiation/quality/duration/timing/severity/associated sxs/prior Treatment) HPI Comments: The patient is a 54 year old male, he has a history of hypertension, renal insufficiency, diabetes not on meds, and sleep apnea requiring CPAP at night. He presents to the hospital or shortness of breath which has been present for over 1 month, seems to be exertional dyspnea, worse with exertion, today it became severe, he was diaphoretic, he did not respond to the albuterol inhaler which she has been using occasionally. He does endorse somewhat of a cough, denies swelling, denies other risk factors for pulmonary embolism. Symptoms are persistent and severe.  Patient is a 54 y.o. male presenting with shortness of breath. The history is provided by the patient and the spouse.  Shortness of Breath   Past Medical History  Diagnosis Date  . Hypertension   . Abscess of anal and rectal regions   . Acute renal insufficiency   . Hemorrhoids, internal   . Sleep apnea   . Diabetes mellitus     "pre diabetic"- no meds  . Asthma    Past Surgical History  Procedure Laterality Date  . Incision and drainage perirectal abscess  06/09/11  . Tonsillectomy    . Ulnar collateral ligament reconstruction  2003    left hand  . Colonoscopy    . Polypectomy     Family History  Problem Relation Age of Onset  . Diabetes Father   . Heart disease Father   . Heart disease Paternal Grandmother   . Diabetes Mother   . Colon cancer Neg Hx   . Esophageal cancer Neg Hx   . Rectal cancer Neg Hx   . Stomach cancer Neg Hx    History  Substance Use Topics  . Smoking status: Former Smoker    Types: Cigarettes    Quit date: 12/15/1995  . Smokeless tobacco: Never Used  . Alcohol Use: 1.0 oz/week    2 Standard  drinks or equivalent per week    Review of Systems  Respiratory: Positive for shortness of breath.   All other systems reviewed and are negative.     Allergies  Shellfish allergy  Home Medications   Prior to Admission medications   Medication Sig Start Date End Date Taking? Authorizing Provider  albuterol (PROVENTIL HFA;VENTOLIN HFA) 108 (90 BASE) MCG/ACT inhaler Inhale 2 puffs into the lungs every 4 (four) hours as needed for wheezing or shortness of breath.   Yes Historical Provider, MD  Albuterol Sulfate 108 (90 BASE) MCG/ACT AEPB Inhale 1 puff into the lungs every 4 (four) hours as needed.   Yes Historical Provider, MD  atorvastatin (LIPITOR) 10 MG tablet Take 10 mg by mouth daily.   Yes Historical Provider, MD  atorvastatin (LIPITOR) 10 MG tablet Take 1 tablet by mouth daily.   Yes Historical Provider, MD  Multiple Vitamin (MULTIVITAMIN) tablet Take 1 tablet by mouth daily.   Yes Historical Provider, MD  naproxen sodium (ANAPROX) 220 MG tablet Take 440 mg by mouth 2 (two) times daily with a meal.   Yes Historical Provider, MD  naproxen sodium (ANAPROX) 220 MG tablet Take 2 tablets by mouth once.   Yes Historical Provider, MD  NON FORMULARY 1 each by Other route See admin instructions. Use CPAP machine nightly.   Yes Historical  Provider, MD  ramipril (ALTACE) 5 MG capsule Take 5 mg by mouth daily.  07/23/11  Yes Historical Provider, MD  ramipril (ALTACE) 5 MG capsule Take 1 capsule by mouth daily.   Yes Historical Provider, MD  traZODone (DESYREL) 50 MG tablet Take 1 tablet by mouth every evening.   Yes Historical Provider, MD  zolpidem (AMBIEN) 5 MG tablet Take 5 mg by mouth at bedtime as needed for sleep.   Yes Historical Provider, MD  zolpidem (AMBIEN) 5 MG tablet Take 1 tablet by mouth at bedtime as needed.   Yes Historical Provider, MD  predniSONE (DELTASONE) 20 MG tablet Take 3 tablets (60 mg total) by mouth daily. 04/13/14   Kristen N Ward, DO   BP 165/97 mmHg  Pulse 96   Temp(Src) 98 F (36.7 C) (Oral)  Resp 18  Wt 328 lb (148.78 kg)  SpO2 97% Physical Exam  Constitutional: He appears well-developed and well-nourished. No distress.  HENT:  Head: Normocephalic and atraumatic.  Mouth/Throat: Oropharynx is clear and moist. No oropharyngeal exudate.  Eyes: Conjunctivae and EOM are normal. Pupils are equal, round, and reactive to light. Right eye exhibits no discharge. Left eye exhibits no discharge. No scleral icterus.  Neck: Normal range of motion. Neck supple. No JVD present. No thyromegaly present.  Cardiovascular: Regular rhythm, normal heart sounds and intact distal pulses.  Exam reveals no gallop and no friction rub.   No murmur heard. Tachycardic  Pulmonary/Chest: Breath sounds normal. He is in respiratory distress. He has no wheezes. He has no rales.  Increased work of breathing, no abnormal lung sounds  Abdominal: Soft. Bowel sounds are normal. He exhibits no distension and no mass. There is no tenderness.  Musculoskeletal: Normal range of motion. He exhibits no edema or tenderness.  Lymphadenopathy:    He has no cervical adenopathy.  Neurological: He is alert. Coordination normal.  Skin: Skin is warm and dry. No rash noted. No erythema.  Psychiatric: He has a normal mood and affect. His behavior is normal.  Nursing note and vitals reviewed.   ED Course  Procedures (including critical care time) Labs Review Labs Reviewed  BRAIN NATRIURETIC PEPTIDE - Abnormal; Notable for the following:    B Natriuretic Peptide 105.2 (*)    All other components within normal limits  CBC WITH DIFFERENTIAL/PLATELET - Abnormal; Notable for the following:    RDW 16.1 (*)    Neutrophils Relative % 78 (*)    Neutro Abs 7.8 (*)    All other components within normal limits  I-STAT CHEM 8, ED - Abnormal; Notable for the following:    Glucose, Bld 138 (*)    Calcium, Ion 1.08 (*)    All other components within normal limits  CBG MONITORING, ED - Abnormal; Notable  for the following:    Glucose-Capillary 124 (*)    All other components within normal limits  PROTIME-INR  Randolm Idol, ED    Imaging Review Dg Chest Port 1 View  11/13/2014   CLINICAL DATA:  Shortness of breath, cough.  EXAM: PORTABLE CHEST - 1 VIEW  COMPARISON:  04/13/2014  FINDINGS: Heart is borderline in size. Vascular congestion noted with bilateral lower lobe airspace opacities, right greater than left. No visible effusions. No acute bony abnormality.  IMPRESSION: Borderline heart size with vascular congestion and bilateral lower lobe opacities, right greater than left. This could represent asymmetric edema or infection.   Electronically Signed   By: Rolm Baptise M.D.   On: 11/13/2014 11:17  EKG Interpretation   Date/Time:  Tuesday November 13 2014 10:17:12 EST Ventricular Rate:  111 PR Interval:  159 QRS Duration: 88 QT Interval:  356 QTC Calculation: 484 R Axis:   67 Text Interpretation:  Sinus tachycardia Borderline prolonged QT interval  Baseline wander in lead(s) II III aVR aVF V2 V3 since last tracing no  significant change Confirmed by Abdulhamid Olgin  MD, Tonnette Zwiebel ([id number]) on 11/13/2014  11:08:52 AM      MDM   Final diagnoses:  SOB (shortness of breath)  Hypertensive crisis    No peripheral edema, no JVD, decreased lung sounds, risk factors for congestive heart failure with severe hypertension at 199/117, consider congestive heart failure as the source of the patient's symptoms. Would also consider pulmonary embolism though less likely, less likely to be COPD or reactive lung disease. Chest x-ray, labs pending.  At change of shift - d/w Dr. Roxanne Mins who will assume care - pt at CTA to r/o PE - no CHF by labs, Anticipate admission  The patient is critically ill with severe hypertension requiring nitroglycerin drip, tachycardia has slowly improved, severe hypertension, hypertensive urgency, or infiltrates of unknown etiology.   CRITICAL CARE Performed by: Johnna Acosta Total critical care time: 35 Critical care time was exclusive of separately billable procedures and treating other patients. Critical care was necessary to treat or prevent imminent or life-threatening deterioration. Critical care was time spent personally by me on the following activities: development of treatment plan with patient and/or surrogate as well as nursing, discussions with consultants, evaluation of patient's response to treatment, examination of patient, obtaining history from patient or surrogate, ordering and performing treatments and interventions, ordering and review of laboratory studies, ordering and review of radiographic studies, pulse oximetry and re-evaluation of patient's condition.   Meds given in ED:  Medications  iohexol (OMNIPAQUE) 350 MG/ML injection 80 mL (not administered)  aspirin chewable tablet 324 mg (324 mg Oral Given 11/13/14 1134)  nitroGLYCERIN 50 mg in dextrose 5 % 250 mL (0.2 mg/mL) infusion (15 mcg/min Intravenous Rate/Dose Change 11/13/14 1249)       Noemi Chapel, MD 11/13/14 1623

## 2014-11-13 NOTE — ED Notes (Signed)
Attempted to call report

## 2014-11-13 NOTE — ED Provider Notes (Signed)
Patient initially seen and evaluated by Dr. Sabra Heck for severe hypertension dyspnea. He was noted to have peripheral edema and chest x-ray suspicious for CHF, but BNP is only mildly elevated. He was sent for CT angiogram to rule out pulmonary embolus and this was negative. He been placed on nitroglycerin drip but blood pressure is still significantly elevated. He is not on any diuretics at baseline, so he is given a dose of furosemide. Case is discussed with Dr. Osborne Casco, on call for Dr. Virgina Jock, who agrees to admit the patient.  Delora Fuel, MD 15/83/09 [id number]

## 2014-11-13 NOTE — ED Notes (Signed)
Pt is more comfortable and able to breath better when sitting on edge of bed-- dangling at present. Family at bedside.

## 2014-11-13 NOTE — ED Notes (Signed)
Pt c/o chest tightness, like indigestion. Pt sitting on side of bed with family at bedside. Lung sounds clear. Awaiting CT angio

## 2014-11-13 NOTE — ED Notes (Signed)
Water given to drink; pt denies chest tightness at this time

## 2014-11-13 NOTE — ED Notes (Addendum)
Pt assisted to side of bed; family at bedside. Pt able to ambulate around the room with shortness of breath noted on exertion; sats 98% on room air. Pt hypertensive at 179/111; nitro increased to 23mcg/min. Pt reports improvement with chest tightness

## 2014-11-13 NOTE — H&P (Signed)
PCP:   Precious Reel, MD   Chief Complaint:  SOB, HTN  HPI: Patient is a 54 year old male with a history of depression, HTN, morbid obesity and sleep apnea with significant other history for anal abscess.  He states that he has had more shortness of breath with progression over the last month as well.  Using his asthma inhaler which has not helped.  He had workup in ER showing no evidence of PE on scan, a BNP of 105 and considerable HTN urgency which is poorly responsive to Nitroglycerin drip.  Notes he had a steroid injection in his knee and his SBP was <140 at that visit as well.  Review of Systems:  Review of Systems -  Complains mostly of SOB and overall fatigue as well as his R knee pain s/p steroid injection yesterday.  Otherwise denies palpitations or chest pain and otherwise negative x 12 point review Past Medical History: Past Medical History  Diagnosis Date  . Hypertension   . Abscess of anal and rectal regions   . Acute renal insufficiency   . Hemorrhoids, internal   . Sleep apnea   . Diabetes mellitus     "pre diabetic"- no meds  . Asthma    Past Surgical History  Procedure Laterality Date  . Incision and drainage perirectal abscess  06/09/11  . Tonsillectomy    . Ulnar collateral ligament reconstruction  2003    left hand  . Colonoscopy    . Polypectomy      Medications: Prior to Admission medications   Medication Sig Start Date End Date Taking? Authorizing Provider  albuterol (PROVENTIL HFA;VENTOLIN HFA) 108 (90 BASE) MCG/ACT inhaler Inhale 2 puffs into the lungs every 4 (four) hours as needed for wheezing or shortness of breath.   Yes Historical Provider, MD  Albuterol Sulfate 108 (90 BASE) MCG/ACT AEPB Inhale 1 puff into the lungs every 4 (four) hours as needed.   Yes Historical Provider, MD  atorvastatin (LIPITOR) 10 MG tablet Take 10 mg by mouth daily.   Yes Historical Provider, MD  atorvastatin (LIPITOR) 10 MG tablet Take 1 tablet by mouth daily.   Yes  Historical Provider, MD  Multiple Vitamin (MULTIVITAMIN) tablet Take 1 tablet by mouth daily.   Yes Historical Provider, MD  naproxen sodium (ANAPROX) 220 MG tablet Take 440 mg by mouth 2 (two) times daily with a meal.   Yes Historical Provider, MD  naproxen sodium (ANAPROX) 220 MG tablet Take 2 tablets by mouth once.   Yes Historical Provider, MD  NON FORMULARY 1 each by Other route See admin instructions. Use CPAP machine nightly.   Yes Historical Provider, MD  ramipril (ALTACE) 5 MG capsule Take 5 mg by mouth daily.  07/23/11  Yes Historical Provider, MD  ramipril (ALTACE) 5 MG capsule Take 1 capsule by mouth daily.   Yes Historical Provider, MD  traZODone (DESYREL) 50 MG tablet Take 1 tablet by mouth every evening.   Yes Historical Provider, MD  zolpidem (AMBIEN) 5 MG tablet Take 5 mg by mouth at bedtime as needed for sleep.   Yes Historical Provider, MD  zolpidem (AMBIEN) 5 MG tablet Take 1 tablet by mouth at bedtime as needed.   Yes Historical Provider, MD  predniSONE (DELTASONE) 20 MG tablet Take 3 tablets (60 mg total) by mouth daily. 04/13/14   Delice Bison Ward, DO    Allergies:   Allergies  Allergen Reactions  . Shellfish Allergy Other (See Comments)    Upsets pt's  stomach- vomiting and diarrhea Upsets pt's stomach- vomiting and diarrhea    Social History:  reports that he quit smoking about 18 years ago. His smoking use included Cigarettes. He has never used smokeless tobacco. He reports that he drinks about 1.0 oz of alcohol per week. He reports that he does not use illicit drugs.  Family History: Family History  Problem Relation Age of Onset  . Diabetes Father   . Heart disease Father   . Heart disease Paternal Grandmother   . Diabetes Mother   . Colon cancer Neg Hx   . Esophageal cancer Neg Hx   . Rectal cancer Neg Hx   . Stomach cancer Neg Hx     Physical Exam: Filed Vitals:   11/13/14 1445 11/13/14 1500 11/13/14 1705 11/13/14 1715  BP: 164/95 165/97 179/111  180/106  Pulse: 89 96 102 102  Temp:      TempSrc:      Resp: 24 18 22 22   Weight:      SpO2: 95% 97% 97% 95%   General appearance: alert, cooperative and appears stated age Head: Normocephalic, without obvious abnormality, atraumatic Eyes: conjunctivae/corneas clear. PERRL, EOM's intact.  Nose: Nares normal. Septum midline. Mucosa normal. No drainage or sinus tenderness. Throat: lips, mucosa, and tongue normal; teeth and gums normal Neck: no adenopathy, no carotid bruit, no JVD and thyroid not enlarged, symmetric, no tenderness/mass/nodules Resp: clear to auscultation bilaterally Cardio: regular rate and rhythm, S1, S2 normal, no murmur, click, rub or gallop GI: soft, non-tender; bowel sounds normal; no masses,  no organomegaly Extremities: extremities normal, atraumatic, no cyanosis , 2+ edema Pulses: 2+ and symmetric Lymph nodes: Cervical adenopathy: no cervical lymphadenopathy Neurologic: Alert and oriented X 3, normal strength and tone. Normal symmetric reflexes.     Labs on Admission:   Recent Labs  11/13/14 1105  NA 140  K 4.5  CL 103  GLUCOSE 138*  BUN 9  CREATININE 1.10    Recent Labs  11/13/14 1105 11/13/14 1300  WBC  --  10.0  NEUTROABS  --  7.8*  HGB 15.3 13.3  HCT 45.0 41.6  MCV  --  84.6  PLT  --  217    Lab Results  Component Value Date   INR 1.08 11/13/2014   INR 1.18 06/09/2011     Radiological Exams on Admission: Ct Angio Chest Pe W/cm &/or Wo Cm  11/13/2014   CLINICAL DATA:  54 year old male with acute shortness of breath, possible asthma exacerbation  EXAM: CT ANGIOGRAPHY CHEST WITH CONTRAST  TECHNIQUE: Multidetector CT imaging of the chest was performed using the standard protocol during bolus administration of intravenous contrast. Multiplanar CT image reconstructions and MIPs were obtained to evaluate the vascular anatomy.  CONTRAST:  45mL OMNIPAQUE IOHEXOL 350 MG/ML SOLN  COMPARISON:  Chest x-ray obtained earlier today  FINDINGS:  Mediastinum: Unremarkable CT appearance of the thyroid gland. No suspicious mediastinal or hilar adenopathy. No soft tissue mediastinal mass. The thoracic esophagus is unremarkable.  Heart/Vascular: Adequate opacification of the pulmonary arteries to the proximal segmental level. Evaluation beyond the segmental level is limited by respiratory motion. No central filling defect to suggest acute pulmonary embolus. The heart is enlarged. No pericardial effusion. Normal aortic caliber without evidence of aneurysm.  Lungs/Pleura: No pleural effusion. Respiratory motion limits evaluation for small pulmonary nodules. Inspiratory volumes are low and there is bibasilar subsegmental atelectasis. No suspicious nodule, mass, consolidation or evidence of edema.  Bones/Soft Tissues: No acute fracture or aggressive appearing lytic  or blastic osseous lesion.  Upper Abdomen: Although incompletely imaged, there is relative hypertrophy of the left and caudate lobes of the liver with respect to the right liver concerning for possible early cirrhotic change. Otherwise, the visualized upper abdomen is unremarkable.  Review of the MIP images confirms the above findings.  IMPRESSION: 1. No evidence of acute pulmonary embolus to the proximal segmental level. Evaluation of the more distal arterial tree is limited by respiratory motion. 2. Low inspiratory volumes with bibasilar subsegmental atelectasis. 3. Cardiomegaly. 4. Abnormal hepatic morphology suggesting the possibility of cirrhosis.   Electronically Signed   By: Jacqulynn Cadet M.D.   On: 11/13/2014 17:08   Dg Chest Port 1 View  11/13/2014   CLINICAL DATA:  Shortness of breath, cough.  EXAM: PORTABLE CHEST - 1 VIEW  COMPARISON:  04/13/2014  FINDINGS: Heart is borderline in size. Vascular congestion noted with bilateral lower lobe airspace opacities, right greater than left. No visible effusions. No acute bony abnormality.  IMPRESSION: Borderline heart size with vascular congestion  and bilateral lower lobe opacities, right greater than left. This could represent asymmetric edema or infection.   Electronically Signed   By: Rolm Baptise M.D.   On: 11/13/2014 11:17   Orders placed or performed during the hospital encounter of 11/13/14  . ED EKG  . ED EKG  . EKG 12-Lead  . EKG 12-Lead    Assessment/Plan Active Problems:   Hypertension, malignant Will continue IV NTG drip, Increase Altace to 10mg  bid to maximize and add Norvasc 10mg  daily.  May need to also add Labetalol if BP not coming down.  Will need ICU level care due to IV NTG and inability to satisfactorily get his BP down even with this.  Of note, his last BP in the office showed a systolic in the 641'R.  Will also check a drug and alcohol screen to see if there is another cause for his HTN urgency.  Continue on diuretics as well. Morbid Obesity:  Long term issue, chronic edema, but his BNP is 102,  Will check Echo in AM as well. OSA:  Continue CPAP Asthma:  Continue PRN nebs. Liver imaging noted ? Of cirrhosis  Will need to review at later date once more stable.  Total critical care time 45 minutes  Modesty Rudy W 11/13/2014, 5:46 PM

## 2014-11-13 NOTE — ED Notes (Signed)
Meal tray ordered 

## 2014-11-13 NOTE — ED Notes (Signed)
Dr. Glick at bedside.  

## 2014-11-14 DIAGNOSIS — R0600 Dyspnea, unspecified: Secondary | ICD-10-CM

## 2014-11-14 LAB — HEPATIC FUNCTION PANEL
ALBUMIN: 3.8 g/dL (ref 3.5–5.2)
ALT: 52 U/L (ref 0–53)
AST: 44 U/L — AB (ref 0–37)
Alkaline Phosphatase: 78 U/L (ref 39–117)
BILIRUBIN DIRECT: 0.2 mg/dL (ref 0.0–0.5)
TOTAL PROTEIN: 8.3 g/dL (ref 6.0–8.3)
Total Bilirubin: UNDETERMINED mg/dL (ref 0.3–1.2)

## 2014-11-14 LAB — BASIC METABOLIC PANEL
Anion gap: 12 (ref 5–15)
BUN: 9 mg/dL (ref 6–23)
CO2: 23 mmol/L (ref 19–32)
Calcium: 8.7 mg/dL (ref 8.4–10.5)
Chloride: 100 mmol/L (ref 96–112)
Creatinine, Ser: 1.21 mg/dL (ref 0.50–1.35)
GFR calc Af Amer: 79 mL/min — ABNORMAL LOW (ref >90)
GFR calc non Af Amer: 68 mL/min — ABNORMAL LOW (ref >90)
Glucose, Bld: 147 mg/dL — ABNORMAL HIGH (ref 70–99)
Potassium: 3.9 mmol/L (ref 3.5–5.1)
SODIUM: 135 mmol/L (ref 135–145)

## 2014-11-14 LAB — CBC
HCT: 39 % (ref 39.0–52.0)
Hemoglobin: 12.5 g/dL — ABNORMAL LOW (ref 13.0–17.0)
MCH: 26.8 pg (ref 26.0–34.0)
MCHC: 32.1 g/dL (ref 30.0–36.0)
MCV: 83.5 fL (ref 78.0–100.0)
Platelets: 221 10*3/uL (ref 150–400)
RBC: 4.67 MIL/uL (ref 4.22–5.81)
RDW: 16.1 % — AB (ref 11.5–15.5)
WBC: 12.9 10*3/uL — AB (ref 4.0–10.5)

## 2014-11-14 LAB — GLUCOSE, CAPILLARY
GLUCOSE-CAPILLARY: 129 mg/dL — AB (ref 70–99)
GLUCOSE-CAPILLARY: 122 mg/dL — AB (ref 70–99)
GLUCOSE-CAPILLARY: 111 mg/dL — AB (ref 70–99)
Glucose-Capillary: 117 mg/dL — ABNORMAL HIGH (ref 70–99)

## 2014-11-14 MED ORDER — FUROSEMIDE 10 MG/ML IJ SOLN
40.0000 mg | Freq: Every day | INTRAMUSCULAR | Status: DC
  Filled 2014-11-14: qty 4

## 2014-11-14 NOTE — Progress Notes (Signed)
Utilization Review Completed.Donne Anon T3/05/2015

## 2014-11-14 NOTE — Progress Notes (Signed)
  Echocardiogram 2D Echocardiogram has been performed.  [name] 11/14/2014, 1:36 PM

## 2014-11-14 NOTE — Progress Notes (Addendum)
Subjective: 54 Male c DM2, morbid Obesity, controlled HTN, OSA and sig knee OA.  S/P Cortisone injection R knee Monday and BP was fine at time of procedure.  Presented yesterday c HTN urgency and flash pulm edema.  Admitted to Step down on NTG gtt.  He is much better c less SOB, no CP.  Wore CPAP overnight.  Feels back to normal.  NTG is off.  2D ECHO Ordered.    Objective: Vital signs in last 24 hours: Temp:  [97.8 F (36.6 C)-98.1 F (36.7 C)] 97.8 F (36.6 C) (03/09 0406) Pulse Rate:  [81-125] 87 (03/09 0630) Resp:  [13-34] 25 (03/09 0630) BP: (96-199)/(50-128) 144/92 mmHg (03/09 0630) SpO2:  [85 %-100 %] 97 % (03/09 0630) Weight:  [148.78 kg (328 lb)-154.7 kg (341 lb 0.8 oz)] 154.7 kg (341 lb 0.8 oz) (03/08 2000) Weight change:     CBG (last 3)   Recent Labs  11/13/14 1036 11/13/14 2211  GLUCAP 124* 134*    Intake/Output from previous day:  Intake/Output Summary (Last 24 hours) at 11/14/14 0803 Last data filed at 11/14/14 0500  Gross per 24 hour  Intake 1162.65 ml  Output   4250 ml  Net -3087.35 ml   03/08 0701 - 03/09 0700 In: 1162.7 [P.O.:1080; I.V.:82.7] Out: 4250 [Urine:4250]   Physical Exam  General appearance: A and O Eyes: no scleral icterus Throat: oropharynx moist without erythema Resp: CTA Cardio: Reg GI: Obese - soft, non-tender; bowel sounds normal; no masses,  no organomegaly Extremities: no clubbing, cyanosis or edema   Lab Results:  Recent Labs  11/13/14 1105 11/13/14 2144 11/14/14 0226  NA 140  --  135  K 4.5  --  3.9  CL 103  --  100  CO2  --   --  23  GLUCOSE 138*  --  147*  BUN 9  --  9  CREATININE 1.10 1.11 1.21  CALCIUM  --   --  8.7    No results for input(s): AST, ALT, ALKPHOS, BILITOT, PROT, ALBUMIN in the last 72 hours.   Recent Labs  11/13/14 1300 11/13/14 2144 11/14/14 0226  WBC 10.0 11.7* 12.9*  NEUTROABS 7.8*  --   --   HGB 13.3 13.5 12.5*  HCT 41.6 41.9 39.0  MCV 84.6 83.1 83.5  PLT 217 227 221     Lab Results  Component Value Date   INR 1.08 11/13/2014   INR 1.18 06/09/2011    No results for input(s): CKTOTAL, CKMB, CKMBINDEX, TROPONINI in the last 72 hours.  No results for input(s): TSH, T4TOTAL, T3FREE, THYROIDAB in the last 72 hours.  Invalid input(s): FREET3  No results for input(s): VITAMINB12, FOLATE, FERRITIN, TIBC, IRON, RETICCTPCT in the last 72 hours.  Micro Results: Recent Results (from the past 240 hour(s))  MRSA PCR Screening     Status: None   Collection Time: 11/13/14  7:50 PM  Result Value Ref Range Status   MRSA by PCR NEGATIVE NEGATIVE Final    Comment:        The GeneXpert MRSA Assay (FDA approved for NASAL specimens only), is one component of a comprehensive MRSA colonization surveillance program. It is not intended to diagnose MRSA infection nor to guide or monitor treatment for MRSA infections.      Studies/Results: Ct Angio Chest Pe W/cm &/or Wo Cm  11/13/2014   CLINICAL DATA:  54 year old male with acute shortness of breath, possible asthma exacerbation  EXAM: CT ANGIOGRAPHY CHEST WITH CONTRAST  TECHNIQUE:  Multidetector CT imaging of the chest was performed using the standard protocol during bolus administration of intravenous contrast. Multiplanar CT image reconstructions and MIPs were obtained to evaluate the vascular anatomy.  CONTRAST:  69mL OMNIPAQUE IOHEXOL 350 MG/ML SOLN  COMPARISON:  Chest x-ray obtained earlier today  FINDINGS: Mediastinum: Unremarkable CT appearance of the thyroid gland. No suspicious mediastinal or hilar adenopathy. No soft tissue mediastinal mass. The thoracic esophagus is unremarkable.  Heart/Vascular: Adequate opacification of the pulmonary arteries to the proximal segmental level. Evaluation beyond the segmental level is limited by respiratory motion. No central filling defect to suggest acute pulmonary embolus. The heart is enlarged. No pericardial effusion. Normal aortic caliber without evidence of aneurysm.   Lungs/Pleura: No pleural effusion. Respiratory motion limits evaluation for small pulmonary nodules. Inspiratory volumes are low and there is bibasilar subsegmental atelectasis. No suspicious nodule, mass, consolidation or evidence of edema.  Bones/Soft Tissues: No acute fracture or aggressive appearing lytic or blastic osseous lesion.  Upper Abdomen: Although incompletely imaged, there is relative hypertrophy of the left and caudate lobes of the liver with respect to the right liver concerning for possible early cirrhotic change. Otherwise, the visualized upper abdomen is unremarkable.  Review of the MIP images confirms the above findings.  IMPRESSION: 1. No evidence of acute pulmonary embolus to the proximal segmental level. Evaluation of the more distal arterial tree is limited by respiratory motion. 2. Low inspiratory volumes with bibasilar subsegmental atelectasis. 3. Cardiomegaly. 4. Abnormal hepatic morphology suggesting the possibility of cirrhosis.   Electronically Signed   By: Jacqulynn Cadet M.D.   On: 11/13/2014 17:08   Dg Chest Port 1 View  11/13/2014   CLINICAL DATA:  Shortness of breath, cough.  EXAM: PORTABLE CHEST - 1 VIEW  COMPARISON:  04/13/2014  FINDINGS: Heart is borderline in size. Vascular congestion noted with bilateral lower lobe airspace opacities, right greater than left. No visible effusions. No acute bony abnormality.  IMPRESSION: Borderline heart size with vascular congestion and bilateral lower lobe opacities, right greater than left. This could represent asymmetric edema or infection.   Electronically Signed   By: Rolm Baptise M.D.   On: 11/13/2014 11:17     Medications: Scheduled: . amLODipine  10 mg Oral Daily  . aspirin EC  325 mg Oral Daily  . atorvastatin  10 mg Oral q1800  . docusate sodium  100 mg Oral BID  . enoxaparin (LOVENOX) injection  40 mg Subcutaneous Q24H  . furosemide  40 mg Intravenous BID  . Influenza vac split quadrivalent PF  0.5 mL Intramuscular  Tomorrow-1000  . insulin aspart  0-15 Units Subcutaneous TID WC  . multivitamin with minerals  1 tablet Oral Daily  . ramipril  10 mg Oral BID  . sodium chloride  3 mL Intravenous Q12H  . traZODone  50 mg Oral QPM  . zolpidem  5 mg Oral QHS   Continuous: . nitroGLYCERIN Stopped (11/14/14 0045)     Assessment/Plan: Active Problems:   Hypertension, malignant  Hypertension, malignant - off NTG gtt.  Meds:  Altace to 10mg  bid, Norvasc 10mg  daily and Lasix 40 daily.  May need to also add Labetalol if BP not coming down.  BP down to 160/100 (145/92 last real check - His arm size is precluding perfectly accurate BP checks) and better c less Sxs. Transfer to tele.  Wonder if cortisone in Knee triggered the BP reaction.  He denies recent alcohol use.  Hold NSAIDs. UDS (-) BNP 105. Trop I's (-)  D Dimer (-) Echo ordered. OSA: Continue CPAP.  Last one done @ 2008 Transfer to floor.  Keep BP tight.  May ne ready for D/c 24-48 hrs. CTPA and CXR reviewed.  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 Asthma: Continue PRN nebs.  Morbid Obesity - Needs Wt loss.  Lipids - On Atorvastatin.  Liver imaging noted ? Of cirrhosis Will need to review at later date once more stable.  Check LFTs, HCV and HBV.  Needs wt loss.  DM2 - A1C P.  CBGs fine.-  Recent Labs  11/13/14 1036 11/13/14 2211  GLUCAP 124* 134*   ID -  Anti-infectives    None     DVT Prophylaxis    LOS: 1 day   Jace Dowe M 11/14/2014, 8:03 AM

## 2014-11-14 NOTE — Progress Notes (Signed)
Pt has home machine and can place on when ready. Pt and family encouraged to call RT if needing any assistance.

## 2014-11-14 NOTE — Progress Notes (Signed)
Pt received alert, verbal with no noted distress. Neuro intact, able to follow commands. Denies pain or discomfort.  Denies dyspnea or shortness of breath. Pt oriented to room. Safety measures in place. Call bell within reach. Will continue to monitor. Report received prior to transfer from nurse, Mickel Baas.

## 2014-11-14 NOTE — Progress Notes (Signed)
Pt complaint of shortness of breathe after taking a walk around unit. Pt assessed 02 sat 100% HR -89. Administered Albuterol nebulizer treatment per Md prn order. Tolerated well. No noted distress. Call bell within reach. Will continue to monitor.

## 2014-11-15 ENCOUNTER — Inpatient Hospital Stay (HOSPITAL_COMMUNITY): Payer: 59

## 2014-11-15 LAB — BASIC METABOLIC PANEL
ANION GAP: 10 (ref 5–15)
BUN: 11 mg/dL (ref 6–23)
CO2: 23 mmol/L (ref 19–32)
Calcium: 8.7 mg/dL (ref 8.4–10.5)
Chloride: 100 mmol/L (ref 96–112)
Creatinine, Ser: 1.11 mg/dL (ref 0.50–1.35)
GFR calc Af Amer: 87 mL/min — ABNORMAL LOW (ref >90)
GFR, EST NON AFRICAN AMERICAN: 75 mL/min — AB (ref >90)
Glucose, Bld: 121 mg/dL — ABNORMAL HIGH (ref 70–99)
Potassium: 3.7 mmol/L (ref 3.5–5.1)
Sodium: 133 mmol/L — ABNORMAL LOW (ref 135–145)

## 2014-11-15 LAB — CBC
HCT: 40.5 % (ref 39.0–52.0)
HEMOGLOBIN: 13 g/dL (ref 13.0–17.0)
MCH: 26.6 pg (ref 26.0–34.0)
MCHC: 32.1 g/dL (ref 30.0–36.0)
MCV: 83 fL (ref 78.0–100.0)
PLATELETS: 199 10*3/uL (ref 150–400)
RBC: 4.88 MIL/uL (ref 4.22–5.81)
RDW: 15.8 % — AB (ref 11.5–15.5)
WBC: 9.7 10*3/uL (ref 4.0–10.5)

## 2014-11-15 LAB — GLUCOSE, CAPILLARY
GLUCOSE-CAPILLARY: 124 mg/dL — AB (ref 70–99)
GLUCOSE-CAPILLARY: 109 mg/dL — AB (ref 70–99)

## 2014-11-15 LAB — HEMOGLOBIN A1C
Hgb A1c MFr Bld: 6.9 % — ABNORMAL HIGH (ref 4.8–5.6)
Mean Plasma Glucose: 151 mg/dL

## 2014-11-15 LAB — HEPATITIS B SURFACE ANTIBODY,QUALITATIVE: Hep B S Ab: NONREACTIVE

## 2014-11-15 MED ORDER — ALPRAZOLAM 0.5 MG PO TABS: 0.5000 mg | ORAL_TABLET | Freq: Two times a day (BID) | ORAL | Status: DC | PRN

## 2014-11-15 MED ORDER — FUROSEMIDE 40 MG PO TABS: ORAL_TABLET | ORAL | Status: DC

## 2014-11-15 MED ORDER — AMLODIPINE BESYLATE 10 MG PO TABS: 10.0000 mg | ORAL_TABLET | Freq: Every day | ORAL | Status: DC

## 2014-11-15 MED ORDER — FUROSEMIDE 40 MG PO TABS
40.0000 mg | ORAL_TABLET | Freq: Every day | ORAL | Status: DC
  Administered 2014-11-15: 40 mg via ORAL
  Filled 2014-11-15: qty 1

## 2014-11-15 MED ORDER — ASPIRIN 81 MG PO CHEW: 81.0000 mg | CHEWABLE_TABLET | Freq: Every day | ORAL | Status: DC

## 2014-11-15 MED ORDER — ACETAMINOPHEN 325 MG PO TABS
650.0000 mg | ORAL_TABLET | Freq: Four times a day (QID) | ORAL | Status: AC | PRN
Start: 2014-11-15 — End: ?

## 2014-11-15 MED ORDER — FUROSEMIDE 20 MG PO TABS
20.0000 mg | ORAL_TABLET | Freq: Every day | ORAL | Status: DC
Start: 2014-11-15 — End: 2014-11-15
  Filled 2014-11-15: qty 1

## 2014-11-15 MED ORDER — RAMIPRIL 10 MG PO CAPS: 10.0000 mg | ORAL_CAPSULE | Freq: Two times a day (BID) | ORAL | Status: DC

## 2014-11-15 MED ORDER — HYDROCODONE-ACETAMINOPHEN 5-325 MG PO TABS: 1.0000 | ORAL_TABLET | Freq: Four times a day (QID) | ORAL | Status: DC | PRN

## 2014-11-15 NOTE — Progress Notes (Signed)
11/15/2014 12:36 PM Discharge AVS meds taken today and those due this evening reviewed.  Follow-up appointments and when to call md reviewed.  D/C IV and TELE.  Questions and concerns addressed.   D/C home per orders. [place]

## 2014-11-15 NOTE — Plan of Care (Signed)
Problem: Food- and Nutrition-Related Knowledge Deficit (NB-1.1) Goal: Nutrition education Formal process to instruct or train a patient/client in a skill or to impart knowledge to help patients/clients voluntarily manage or modify food choices and eating behavior to maintain or improve health.  Outcome: Completed/Met Date Met:  11/15/14 Nutrition Education Note  RD consulted for nutrition education regarding low sodium, weight loss diet.  RD provided "Low Sodium Nutrition Therapy" handout from the Academy of [employer]. Reviewed patient's dietary recall. Provided examples on ways to decrease sodium intake in diet. Discouraged intake of processed foods and use of salt shaker. Encouraged fresh fruits and vegetables as well as whole grain sources of carbohydrates to maximize fiber intake.   Also provide "Weight Loss Tips" handout and reviewed ways to support weight loss goals. Teach back method used.  Expect good compliance.  Body mass index is 47.59 kg/(m^2). Pt meets criteria for morbid obesity based on current BMI.  Labs and medications reviewed. No further nutrition interventions warranted at this time. Patient to be d/c'ed home today. RD contact information provided. If additional nutrition issues arise, please re-consult RD.   Molli Barrows, RD, LDN, [address] Pager [phone] After Hours Pager [phone]

## 2014-11-15 NOTE — Discharge Summary (Signed)
Physician Discharge Summary  DISCHARGE SUMMARY   Patient ID: [name] MR#: [id number] DOB/AGE: 04/03/1961 54 y.o.   Attending 7 M  Patient's SWN:IOEVO,JJKK M, MD  Consults:   Admit date: 11/13/2014 Discharge date: 11/15/2014  Discharge Diagnoses:  Active Problems:   Hypertension, malignant   Patient Active Problem List   Diagnosis Date Noted  . Hypertension, malignant 11/13/2014  . Anorectal abscess,left 07/27/2011   Past Medical History  Diagnosis Date  . Hypertension   . Abscess of anal and rectal regions   . Acute renal insufficiency   . Hemorrhoids, internal   . Sleep apnea   . Diabetes mellitus     "pre diabetic"- no meds  . Asthma     Discharged Condition: good   Discharge Medications:   Medication List    STOP taking these medications        naproxen sodium 220 MG tablet  Commonly known as:  ANAPROX     predniSONE 20 MG tablet  Commonly known as:  DELTASONE      TAKE these medications        acetaminophen 325 MG tablet  Commonly known as:  TYLENOL  Take 2 tablets (650 mg total) by mouth every 6 (six) hours as needed for mild pain (or Fever >/= 101).     albuterol 108 (90 BASE) MCG/ACT inhaler  Commonly known as:  PROVENTIL HFA;VENTOLIN HFA  Inhale 2 puffs into the lungs every 4 (four) hours as needed for wheezing or shortness of breath.     ALPRAZolam 0.5 MG tablet  Commonly known as:  XANAX  Take 1 tablet (0.5 mg total) by mouth 2 (two) times daily as needed for anxiety.     amLODipine 10 MG tablet  Commonly known as:  NORVASC  Take 1 tablet (10 mg total) by mouth daily.     aspirin 81 MG chewable tablet  Chew 1 tablet (81 mg total) by mouth daily.     atorvastatin 10 MG tablet  Commonly known as:  LIPITOR  Take 10 mg by mouth daily.     furosemide 40 MG tablet  Commonly known as:  LASIX  Take one daily for 10 days then one daily prn SOB/Edema     HYDROcodone-acetaminophen 5-325 MG per tablet   Commonly known as:  NORCO/VICODIN  - Take 1 tablet by mouth every 6 (six) hours as needed for moderate pain.   -   -      multivitamin tablet  Take 1 tablet by mouth daily.     NON FORMULARY  1 each by Other route See admin instructions. Use CPAP machine nightly.     ramipril 10 MG capsule  Commonly known as:  ALTACE  Take 1 capsule (10 mg total) by mouth 2 (two) times daily.     traZODone 50 MG tablet  Commonly known as:  DESYREL  Take 1 tablet by mouth every evening.     zolpidem 5 MG tablet  Commonly known as:  AMBIEN  Take 5 mg by mouth at bedtime as needed for sleep.        Hospital Procedures: Dg Chest 2 View  11/15/2014   CLINICAL DATA:  Initial evaluation for poor breath  EXAM: CHEST  2 VIEW  COMPARISON:  11/13/2014  FINDINGS: There is mild to moderate cardiac enlargement. Vascular pattern shows mild central congestion. Mild peribronchial cuffing. There are a few horizontally oriented linear densities in the lung bases peripherally but no definite pulmonary edema. These opacities  could represent atelectasis. No consolidation or effusion.  IMPRESSION: There is cardiac enlargement and vascular congestion but no convincing pulmonary edema.   Electronically Signed   By: Skipper Cliche M.D.   On: 11/15/2014 08:28   Ct Angio Chest Pe W/cm &/or Wo Cm  11/13/2014   CLINICAL DATA:  54 year old male with acute shortness of breath, possible asthma exacerbation  EXAM: CT ANGIOGRAPHY CHEST WITH CONTRAST  TECHNIQUE: Multidetector CT imaging of the chest was performed using the standard protocol during bolus administration of intravenous contrast. Multiplanar CT image reconstructions and MIPs were obtained to evaluate the vascular anatomy.  CONTRAST:  23mL OMNIPAQUE IOHEXOL 350 MG/ML SOLN  COMPARISON:  Chest x-ray obtained earlier today  FINDINGS: Mediastinum: Unremarkable CT appearance of the thyroid gland. No suspicious mediastinal or hilar adenopathy. No soft tissue mediastinal mass.  The thoracic esophagus is unremarkable.  Heart/Vascular: Adequate opacification of the pulmonary arteries to the proximal segmental level. Evaluation beyond the segmental level is limited by respiratory motion. No central filling defect to suggest acute pulmonary embolus. The heart is enlarged. No pericardial effusion. Normal aortic caliber without evidence of aneurysm.  Lungs/Pleura: No pleural effusion. Respiratory motion limits evaluation for small pulmonary nodules. Inspiratory volumes are low and there is bibasilar subsegmental atelectasis. No suspicious nodule, mass, consolidation or evidence of edema.  Bones/Soft Tissues: No acute fracture or aggressive appearing lytic or blastic osseous lesion.  Upper Abdomen: Although incompletely imaged, there is relative hypertrophy of the left and caudate lobes of the liver with respect to the right liver concerning for possible early cirrhotic change. Otherwise, the visualized upper abdomen is unremarkable.  Review of the MIP images confirms the above findings.  IMPRESSION: 1. No evidence of acute pulmonary embolus to the proximal segmental level. Evaluation of the more distal arterial tree is limited by respiratory motion. 2. Low inspiratory volumes with bibasilar subsegmental atelectasis. 3. Cardiomegaly. 4. Abnormal hepatic morphology suggesting the possibility of cirrhosis.   Electronically Signed   By: Jacqulynn Cadet M.D.   On: 11/13/2014 17:08   Dg Chest Port 1 View  11/13/2014   CLINICAL DATA:  Shortness of breath, cough.  EXAM: PORTABLE CHEST - 1 VIEW  COMPARISON:  04/13/2014  FINDINGS: Heart is borderline in size. Vascular congestion noted with bilateral lower lobe airspace opacities, right greater than left. No visible effusions. No acute bony abnormality.  IMPRESSION: Borderline heart size with vascular congestion and bilateral lower lobe opacities, right greater than left. This could represent asymmetric edema or infection.   Electronically Signed   By:  Rolm Baptise M.D.   On: 11/13/2014 11:17    History of Present Illness: 54 year old male with a history of depression, HTN, morbid obesity and sleep apnea with significant other history for anal abscess. He states that he has had more shortness of breath with progression over the last month as well. Using his asthma inhaler which has not helped. He called out EMS the night prior to admission and did not goto the ED.  He did eventually go to the ED on 3/8. He had workup in ER showing no evidence of PE on scan, a BNP of 105 and considerable HTN urgency which was poorly responsive to Nitroglycerin drip.  The HTN Urgency was associated c SOB/DOE/Pulm edema on CXR.  He notes that he had a steroid injection in his knee on the Monday prior to admission and his SBP was <140 at that visit, also has been using more NSAIDs lately.   Hospital  Course: 50 Male c DM2, morbid Obesity, controlled HTN, OSA and sig knee OA. S/P Cortisone injection R knee Monday and BP was fine at time of procedure. BP was fine during 09/2014 appt.  Last ECHO @ 2008 was fine.  He has been compliant c CPAP.  Only missed one Altace prior to admit.  Presented 3/8 c HTN urgency and flash pulm edema. Admitted to Step down on NTG gtt. He was much better c less SOB, no CP on 3/9 am - he had been weaned off the NTG and BP meds expanded including diuresis. Wore CPAP overnight. Felt back to normal. 2D ECHO done on 3/9 and showed - LV EF: 50% -  55%, - Left ventricle cavity size was normal. Mild concentric hypertrophy. grade 2 diastolic dysfunction. - Aortic valve: Poorly visualized.  - Right ventricle mildly dilated. Wall thickness was normal.  - Atrial septum: Cannot rule out PFO by colorflow doppler. No Pulm HTN appreciated.  We moved him out of step down on 3/9 to floor/Tele.  BP remained high but much better than admission.  He remained at baseline.  He has been up ambulating.  He can go home on more meds with close follow up.  Pt  complaint of shortness of breathe after taking a walk around unit. Pt assessed by nursing and 02 sat 100% HR -89. Administered Albuterol nebulizer treatment per Md prn order. Tolerated well. No noted distress.    Hypertension, malignant/Urgent - off NTG gtt. Meds ramped up to: Altace to 10mg  bid, Norvasc 10mg  daily and Lasix 20-40 daily - We adjusted Lasix IV to PO.  May need to also add Labetalol if BP not coming down.  BP down but not ideal - better c less Sxs. Wonder if cortisone in Knee or NSAIDs or both triggered the BP reaction. He denies recent alcohol use. Hold NSAIDs. UDS (-) BNP 105. Trop I's (-) D Dimer (-)  OSA: Continue CPAP.  CTPA and CXR reviewed.  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 Asthma: Continue PRN nebs.  Morbid Obesity - Needs Wt loss.  Lipids - On Atorvastatin.  Liver imaging noted ? Of cirrhosis Will need to review again at later date once more stable. LFTs just showed ALT at 44 - rest of LFTs fine., HCV P and Hep B surface Ab was (-).  Needs wt loss. Presumed more Fatty Liver/Steatosis  DM2 - A1C 6.9.  Needs weight loss. Had ISS.  We discussed when to start long term meds (we are close).  If we needed too we could use the GLP-1s and try for weight loss as well.  DVT Proph was provided by [facility].  Seen on am rounds 3/10 and he was doing well.  Labs fine.  All ?s answered.  PE benign.  CXR looks clearer to me.  He is breathing better OK for D/c Needs weight off. Wearing TED hose this am and will go home with them.  Repeat CXR showed:  There is cardiac enlargement and vascular congestion but no convincing pulmonary edema.   Day of Discharge Exam BP 126/94 mmHg  Pulse 90  Temp(Src) 98.2 F (36.8 C) (Oral)  Resp 18  Ht 5\' 11"  (1.803 m)  Wt 154.7 kg (341 lb 0.8 oz)  BMI 47.59 kg/m2  SpO2 97%  Physical Exam:  General appearance: A and O Eyes: no scleral icterus Throat: oropharynx  moist without erythema Resp: CTA Cardio: Reg GI: Obese - soft, non-tender; bowel sounds normal;  no masses, no organomegaly Extremities: no clubbing, cyanosis or edema  Discharge Labs:  Recent Labs  11/14/14 0226 11/15/14 0613  NA 135 133*  K 3.9 3.7  CL 100 100  CO2 23 23  GLUCOSE 147* 121*  BUN 9 11  CREATININE 1.21 1.11  CALCIUM 8.7 8.7    Recent Labs  11/14/14 1019  AST 44*  ALT 52  ALKPHOS 78  BILITOT QUANTITY NOT SUFFICIENT, UNABLE TO PERFORM TEST  PROT 8.3  ALBUMIN 3.8    Recent Labs  11/13/14 1300  11/14/14 0226 11/15/14 0613  WBC 10.0  < > 12.9* 9.7  NEUTROABS 7.8*  --   --   --   HGB 13.3  < > 12.5* 13.0  HCT 41.6  < > 39.0 40.5  MCV 84.6  < > 83.5 83.0  PLT 217  < > 221 199  < > = values in this interval not displayed. No results for input(s): CKTOTAL, CKMB, CKMBINDEX, TROPONINI in the last 72 hours. No results for input(s): TSH, T4TOTAL, T3FREE, THYROIDAB in the last 72 hours.  Invalid input(s): FREET3 No results for input(s): VITAMINB12, FOLATE, FERRITIN, TIBC, IRON, RETICCTPCT in the last 72 hours. Lab Results  Component Value Date   INR 1.08 11/13/2014   INR 1.18 06/09/2011       Discharge instructions:  01-Home or Self Care Follow-up Information    Follow up with Michael Reel, MD In 1 week.   Specialty:  Internal Medicine   Contact information:   [address] [place] [address] [phone]        Disposition: Home  Follow-up Appts: Follow-up with Dr. Virgina Jock at [facility] in 1 week.  Call for appointment.  Condition on Discharge: stable to better  Tests Needing Follow-up: BMET/CBC/Weight/BP  Time spent in discharge (includes decision making & examination of pt): 30 min  Signed: Craige Patel M 11/15/2014, 3:43 PM

## 2014-11-16 LAB — HEPATITIS C ANTIBODY: HCV AB: NEGATIVE

## 2016-09-22 DIAGNOSIS — F331 Major depressive disorder, recurrent, moderate: Secondary | ICD-10-CM | POA: Diagnosis not present

## 2016-10-22 DIAGNOSIS — K30 Functional dyspepsia: Secondary | ICD-10-CM | POA: Diagnosis not present

## 2016-10-22 DIAGNOSIS — K611 Rectal abscess: Secondary | ICD-10-CM | POA: Diagnosis not present

## 2016-10-22 DIAGNOSIS — K219 Gastro-esophageal reflux disease without esophagitis: Secondary | ICD-10-CM | POA: Diagnosis not present

## 2016-10-22 DIAGNOSIS — F325 Major depressive disorder, single episode, in full remission: Secondary | ICD-10-CM | POA: Diagnosis not present

## 2016-10-22 DIAGNOSIS — Z6841 Body Mass Index (BMI) 40.0 and over, adult: Secondary | ICD-10-CM | POA: Diagnosis not present

## 2016-10-22 DIAGNOSIS — I5189 Other ill-defined heart diseases: Secondary | ICD-10-CM | POA: Diagnosis not present

## 2016-10-22 DIAGNOSIS — M1990 Unspecified osteoarthritis, unspecified site: Secondary | ICD-10-CM | POA: Diagnosis not present

## 2016-10-22 DIAGNOSIS — I10 Essential (primary) hypertension: Secondary | ICD-10-CM | POA: Diagnosis not present

## 2016-10-22 DIAGNOSIS — E119 Type 2 diabetes mellitus without complications: Secondary | ICD-10-CM | POA: Diagnosis not present

## 2016-10-30 ENCOUNTER — Encounter: Payer: Self-pay | Admitting: Internal Medicine

## 2016-10-30 DIAGNOSIS — F331 Major depressive disorder, recurrent, moderate: Secondary | ICD-10-CM | POA: Diagnosis not present

## 2016-11-06 DIAGNOSIS — M1712 Unilateral primary osteoarthritis, left knee: Secondary | ICD-10-CM | POA: Diagnosis not present

## 2016-11-06 DIAGNOSIS — Z6841 Body Mass Index (BMI) 40.0 and over, adult: Secondary | ICD-10-CM | POA: Diagnosis not present

## 2016-11-06 DIAGNOSIS — M1711 Unilateral primary osteoarthritis, right knee: Secondary | ICD-10-CM | POA: Diagnosis not present

## 2016-11-25 DIAGNOSIS — M1712 Unilateral primary osteoarthritis, left knee: Secondary | ICD-10-CM | POA: Diagnosis not present

## 2016-12-11 DIAGNOSIS — F331 Major depressive disorder, recurrent, moderate: Secondary | ICD-10-CM | POA: Diagnosis not present

## 2017-01-21 DIAGNOSIS — F331 Major depressive disorder, recurrent, moderate: Secondary | ICD-10-CM | POA: Diagnosis not present

## 2017-02-17 DIAGNOSIS — F331 Major depressive disorder, recurrent, moderate: Secondary | ICD-10-CM | POA: Diagnosis not present

## 2017-03-12 DIAGNOSIS — Z125 Encounter for screening for malignant neoplasm of prostate: Secondary | ICD-10-CM | POA: Diagnosis not present

## 2017-03-12 DIAGNOSIS — I10 Essential (primary) hypertension: Secondary | ICD-10-CM | POA: Diagnosis not present

## 2017-03-12 DIAGNOSIS — E119 Type 2 diabetes mellitus without complications: Secondary | ICD-10-CM | POA: Diagnosis not present

## 2017-03-19 DIAGNOSIS — F325 Major depressive disorder, single episode, in full remission: Secondary | ICD-10-CM | POA: Diagnosis not present

## 2017-03-19 DIAGNOSIS — I10 Essential (primary) hypertension: Secondary | ICD-10-CM | POA: Diagnosis not present

## 2017-03-19 DIAGNOSIS — K30 Functional dyspepsia: Secondary | ICD-10-CM | POA: Diagnosis not present

## 2017-03-19 DIAGNOSIS — Z6841 Body Mass Index (BMI) 40.0 and over, adult: Secondary | ICD-10-CM | POA: Diagnosis not present

## 2017-03-19 DIAGNOSIS — E119 Type 2 diabetes mellitus without complications: Secondary | ICD-10-CM | POA: Diagnosis not present

## 2017-03-19 DIAGNOSIS — Z0000 Encounter for general adult medical examination without abnormal findings: Secondary | ICD-10-CM | POA: Diagnosis not present

## 2017-03-19 DIAGNOSIS — Z23 Encounter for immunization: Secondary | ICD-10-CM | POA: Diagnosis not present

## 2017-03-19 DIAGNOSIS — I5189 Other ill-defined heart diseases: Secondary | ICD-10-CM | POA: Diagnosis not present

## 2017-03-19 DIAGNOSIS — Z1389 Encounter for screening for other disorder: Secondary | ICD-10-CM | POA: Diagnosis not present

## 2017-03-19 DIAGNOSIS — G4733 Obstructive sleep apnea (adult) (pediatric): Secondary | ICD-10-CM | POA: Diagnosis not present

## 2017-03-19 DIAGNOSIS — M1990 Unspecified osteoarthritis, unspecified site: Secondary | ICD-10-CM | POA: Diagnosis not present

## 2017-03-22 DIAGNOSIS — Z1212 Encounter for screening for malignant neoplasm of rectum: Secondary | ICD-10-CM | POA: Diagnosis not present

## 2017-03-30 DIAGNOSIS — Z9989 Dependence on other enabling machines and devices: Secondary | ICD-10-CM | POA: Diagnosis not present

## 2017-03-30 DIAGNOSIS — H6123 Impacted cerumen, bilateral: Secondary | ICD-10-CM | POA: Diagnosis not present

## 2017-03-30 DIAGNOSIS — J309 Allergic rhinitis, unspecified: Secondary | ICD-10-CM | POA: Diagnosis not present

## 2017-03-30 DIAGNOSIS — H60313 Diffuse otitis externa, bilateral: Secondary | ICD-10-CM | POA: Insufficient documentation

## 2017-03-30 DIAGNOSIS — G4733 Obstructive sleep apnea (adult) (pediatric): Secondary | ICD-10-CM | POA: Diagnosis not present

## 2017-03-31 DIAGNOSIS — F331 Major depressive disorder, recurrent, moderate: Secondary | ICD-10-CM | POA: Diagnosis not present

## 2017-04-06 DIAGNOSIS — F331 Major depressive disorder, recurrent, moderate: Secondary | ICD-10-CM | POA: Diagnosis not present

## 2017-04-09 DIAGNOSIS — E668 Other obesity: Secondary | ICD-10-CM | POA: Diagnosis not present

## 2017-04-09 DIAGNOSIS — Z6841 Body Mass Index (BMI) 40.0 and over, adult: Secondary | ICD-10-CM | POA: Diagnosis not present

## 2017-04-09 DIAGNOSIS — I10 Essential (primary) hypertension: Secondary | ICD-10-CM | POA: Diagnosis not present

## 2017-05-25 DIAGNOSIS — F331 Major depressive disorder, recurrent, moderate: Secondary | ICD-10-CM | POA: Diagnosis not present

## 2017-06-18 DIAGNOSIS — M170 Bilateral primary osteoarthritis of knee: Secondary | ICD-10-CM | POA: Diagnosis not present

## 2017-06-30 DIAGNOSIS — I5189 Other ill-defined heart diseases: Secondary | ICD-10-CM | POA: Diagnosis not present

## 2017-06-30 DIAGNOSIS — Z6841 Body Mass Index (BMI) 40.0 and over, adult: Secondary | ICD-10-CM | POA: Diagnosis not present

## 2017-06-30 DIAGNOSIS — R0609 Other forms of dyspnea: Secondary | ICD-10-CM | POA: Diagnosis not present

## 2017-06-30 DIAGNOSIS — R0789 Other chest pain: Secondary | ICD-10-CM | POA: Diagnosis not present

## 2017-07-01 ENCOUNTER — Telehealth: Payer: Self-pay | Admitting: Cardiology

## 2017-07-01 NOTE — Telephone Encounter (Signed)
Received records from [facility] for appointment on 07/07/17 with Dr Stanford Breed.  Records put with Dr Jacalyn Lefevre schedule for 07/07/17.  lp

## 2017-07-02 DIAGNOSIS — Z6841 Body Mass Index (BMI) 40.0 and over, adult: Secondary | ICD-10-CM | POA: Diagnosis not present

## 2017-07-02 DIAGNOSIS — I5189 Other ill-defined heart diseases: Secondary | ICD-10-CM | POA: Diagnosis not present

## 2017-07-02 DIAGNOSIS — R0789 Other chest pain: Secondary | ICD-10-CM | POA: Diagnosis not present

## 2017-07-02 NOTE — Progress Notes (Signed)
Referring-Michael Virgina Jock MD Reason for referral-Hypertension, CP and dyspnea  HPI: 56 year old male for evaluation of hypertension, dyspnea and chest pain at request of Shon Baton, MD. Nuclear study 2007 normal. Echocardiogram March 2016 showed normal LV function, grade 2 diastolic dysfunction and mild right ventricular enlargement. Laboratories October 2018 showed BUN 10, creatinine 1.1, potassium 3.7. Hemoglobin 14. Chest x-ray showed mild atelectasis. Seen recently by Dr. Virgina [name] and noted to have volume excess. Started on Lasix. Cardiology now asked to evaluate. Patient states that approximately 1-1/2 weeks ago he developed increasing dyspnea and orthopnea. No pedal edema. He also had chest pressure that was continuous. It increased with lying flat. He was seen by primary care and placed on Lasix 40 mg daily and his symptoms have now improved. Note he does not have exertional chest pain and no palpitations or syncope. Cardiology now asked to evaluate.  Current Outpatient Prescriptions  Medication Sig Dispense Refill  . acetaminophen (TYLENOL) 325 MG tablet Take 2 tablets (650 mg total) by mouth every 6 (six) hours as needed for mild pain (or Fever >/= 101).    Marland Kitchen albuterol (PROVENTIL HFA;VENTOLIN HFA) 108 (90 BASE) MCG/ACT inhaler Inhale 2 puffs into the lungs every 4 (four) hours as needed for wheezing or shortness of breath.    . ALPRAZolam (XANAX) 0.5 MG tablet Take 1 tablet (0.5 mg total) by mouth 2 (two) times daily as needed for anxiety. 20 tablet 0  . amLODipine (NORVASC) 10 MG tablet Take 1 tablet (10 mg total) by mouth daily. 30 tablet 3  . aspirin 81 MG chewable tablet Chew 1 tablet (81 mg total) by mouth daily.    Marland Kitchen atorvastatin (LIPITOR) 10 MG tablet Take 10 mg by mouth daily.    . DULoxetine HCl (CYMBALTA PO) Take 1 tablet by mouth daily.    . furosemide (LASIX) 40 MG tablet Take 40 mg by mouth daily.    Marland Kitchen HYDROcodone-acetaminophen (NORCO/VICODIN) 5-325 MG per tablet Take 1 tablet by  mouth every 6 (six) hours as needed for moderate pain.     30 tablet 0  . Multiple Vitamin (MULTIVITAMIN) tablet Take 1 tablet by mouth daily.    . NON FORMULARY 1 each by Other route See admin instructions. Use CPAP machine nightly.    . ramipril (ALTACE) 10 MG capsule Take 1 capsule (10 mg total) by mouth 2 (two) times daily. 60 capsule 1  . traZODone (DESYREL) 50 MG tablet Take 1 tablet by mouth every evening.    . zolpidem (AMBIEN) 5 MG tablet Take 5 mg by mouth at bedtime as needed for sleep.     No current facility-administered medications for this visit.     Allergies  Allergen Reactions  . Shellfish Allergy Nausea And Vomiting     Past Medical History:  Diagnosis Date  . Abscess of anal and rectal regions   . Acute renal insufficiency   . Asthma   . Chest pain   . Diabetes mellitus    "pre diabetic"- no meds  . DOE (dyspnea on exertion)   . Hemorrhoids, internal   . Hyperlipidemia   . Hypertension   . Sleep apnea     Past Surgical History:  Procedure Laterality Date  . COLONOSCOPY    . INCISION AND DRAINAGE PERIRECTAL ABSCESS  06/09/11  . POLYPECTOMY    . TONSILLECTOMY    . ULNAR COLLATERAL LIGAMENT RECONSTRUCTION  2003   left hand    Social History   Social History  . Marital  status: Married    Spouse name: N/A  . Number of children: 3  . Years of education: N/A   Occupational History  . Not on file.   Social History Main Topics  . Smoking status: Former Smoker    Types: Cigarettes    Quit date: 12/15/1995  . Smokeless tobacco: Never Used  . Alcohol use 1.0 oz/week    2 Standard drinks or equivalent per week     Comment: Occasional      . Drug use: No  . Sexual activity: Not on file   Other Topics Concern  . Not on file   Social History Narrative  . No narrative on file    Family History  Problem Relation Age of Onset  . Diabetes Father   . Heart disease Father   . Diabetes Mother   . Heart disease Paternal Grandmother   . Colon  cancer Neg Hx   . Esophageal cancer Neg Hx   . Rectal cancer Neg Hx   . Stomach cancer Neg Hx     ROS: no fevers or chills, productive cough, hemoptysis, dysphasia, odynophagia, melena, hematochezia, dysuria, hematuria, rash, seizure activity, orthopnea, PND, pedal edema, claudication. Remaining systems are negative.  Physical Exam:   Blood pressure 118/86, pulse 88, height 5\' 11"  (1.803 m), weight (!) 330 lb (149.7 kg).  General:  Well developed/obese well nourished in NAD Skin warm/dry Patient not depressed No peripheral clubbing Back-normal HEENT-normal/normal eyelids Neck supple/normal carotid upstroke bilaterally; no bruits; no JVD; no thyromegaly chest - CTA/ normal expansion CV - RRR/normal S1 and S2; no murmurs, rubs or gallops;  PMI nondisplaced Abdomen -NT/ND, no HSM, no mass, + bowel sounds, no bruit 2+ femoral pulses, no bruits Ext-no edema, chords, 2+ DP Neuro-grossly nonfocal  ECG - Sinus rhythm at a rate of 88. Occasional PAC. Nonspecific ST changes. Laboratories October 2018 showed BUN 10, creatinine 1.1, potassium 3.7. Hemoglobin 14. personally reviewed  A/P  1 dyspnea-patient developed increasing CHF symptoms likely from acute on chronic diastolic congestive heart failure. His symptoms have improved with addition of Lasix. I will continue 40 mg daily. Check potassium and renal function. Patient instructed on low sodium diet and fluid restriction. Check echocardiogram for LV and RV function.  2 chest pain-symptoms are atypical. I will plan a Lexiscan nuclear study to further assess. Note he cannot ambulate on the treadmill because of knee arthralgias.  3 hypertension-blood pressure is controlled. Continue present medications.  4 hyperlipidemia-continue statin.  5 morbid obesity-we discussed the importance of diet, exercise and weight loss.  Kirk Ruths, MD

## 2017-07-07 ENCOUNTER — Encounter: Payer: Self-pay | Admitting: Cardiology

## 2017-07-07 ENCOUNTER — Ambulatory Visit (INDEPENDENT_AMBULATORY_CARE_PROVIDER_SITE_OTHER): Payer: Medicare Other | Admitting: Cardiology

## 2017-07-07 VITALS — BP 118/86 | HR 88 | Ht 71.0 in | Wt 330.0 lb

## 2017-07-07 DIAGNOSIS — R072 Precordial pain: Secondary | ICD-10-CM

## 2017-07-07 DIAGNOSIS — E7800 Pure hypercholesterolemia, unspecified: Secondary | ICD-10-CM

## 2017-07-07 DIAGNOSIS — I509 Heart failure, unspecified: Secondary | ICD-10-CM

## 2017-07-07 DIAGNOSIS — I10 Essential (primary) hypertension: Secondary | ICD-10-CM | POA: Diagnosis not present

## 2017-07-07 NOTE — Patient Instructions (Signed)
Medication Instructions:   NO CHANGE  Labwork:  Your physician recommends that you return for lab work WITH STRESS TEST  Testing/Procedures:  Your physician has requested that you have an echocardiogram. Echocardiography is a painless test that uses sound waves to create images of your heart. It provides your doctor with information about the size and shape of your heart and how well your heart's chambers and valves are working. This procedure takes approximately one hour. There are no restrictions for this procedure.   Your physician has requested that you have a lexiscan myoview. For further information please visit [email or web address]. Please follow instruction sheet, as given.   Follow-Up:  Your physician wants you to follow-up in: [place] will receive a reminder letter in the mail two months in advance. If you don't receive a letter, please call our office to schedule the follow-up appointment.   If you need a refill on your cardiac medications before your next appointment, please call your pharmacy.

## 2017-07-08 ENCOUNTER — Encounter: Payer: Self-pay | Admitting: *Deleted

## 2017-07-08 LAB — BASIC METABOLIC PANEL
BUN/Creatinine Ratio: 14 (ref 9–20)
BUN: 16 mg/dL (ref 6–24)
CO2: 18 mmol/L — AB (ref 20–29)
CREATININE: 1.17 mg/dL (ref 0.76–1.27)
Calcium: 9.6 mg/dL (ref 8.7–10.2)
Chloride: 103 mmol/L (ref 96–106)
GFR calc Af Amer: 81 mL/min/{1.73_m2} (ref >59)
GFR, EST NON AFRICAN AMERICAN: 70 mL/min/{1.73_m2} (ref >59)
GLUCOSE: 128 mg/dL — AB (ref 65–99)
Potassium: 4.1 mmol/L (ref 3.5–5.2)
SODIUM: 139 mmol/L (ref 134–144)

## 2017-07-15 ENCOUNTER — Other Ambulatory Visit: Payer: Self-pay

## 2017-07-15 ENCOUNTER — Ambulatory Visit (HOSPITAL_COMMUNITY): Payer: Medicare Other | Attending: Cardiology

## 2017-07-15 DIAGNOSIS — E119 Type 2 diabetes mellitus without complications: Secondary | ICD-10-CM | POA: Diagnosis not present

## 2017-07-15 DIAGNOSIS — I110 Hypertensive heart disease with heart failure: Secondary | ICD-10-CM | POA: Insufficient documentation

## 2017-07-15 DIAGNOSIS — Z87891 Personal history of nicotine dependence: Secondary | ICD-10-CM | POA: Diagnosis not present

## 2017-07-15 DIAGNOSIS — I509 Heart failure, unspecified: Secondary | ICD-10-CM | POA: Diagnosis not present

## 2017-07-15 DIAGNOSIS — E785 Hyperlipidemia, unspecified: Secondary | ICD-10-CM | POA: Diagnosis not present

## 2017-07-15 DIAGNOSIS — Z8249 Family history of ischemic heart disease and other diseases of the circulatory system: Secondary | ICD-10-CM | POA: Diagnosis not present

## 2017-07-16 ENCOUNTER — Telehealth: Payer: Self-pay | Admitting: *Deleted

## 2017-07-16 NOTE — Telephone Encounter (Addendum)
-----   Message from Michael Perla, MD sent at 07/15/2017  4:18 PM EST ----- LV function reduced; needs fuov Kirk Ruths   Left message for pt to call

## 2017-07-19 ENCOUNTER — Encounter: Payer: Self-pay | Admitting: Cardiology

## 2017-07-19 DIAGNOSIS — G4733 Obstructive sleep apnea (adult) (pediatric): Secondary | ICD-10-CM | POA: Diagnosis not present

## 2017-07-19 DIAGNOSIS — I5189 Other ill-defined heart diseases: Secondary | ICD-10-CM | POA: Diagnosis not present

## 2017-07-19 DIAGNOSIS — I10 Essential (primary) hypertension: Secondary | ICD-10-CM | POA: Diagnosis not present

## 2017-07-19 DIAGNOSIS — I5042 Chronic combined systolic (congestive) and diastolic (congestive) heart failure: Secondary | ICD-10-CM | POA: Diagnosis not present

## 2017-07-19 DIAGNOSIS — R0789 Other chest pain: Secondary | ICD-10-CM | POA: Diagnosis not present

## 2017-07-19 DIAGNOSIS — E119 Type 2 diabetes mellitus without complications: Secondary | ICD-10-CM | POA: Diagnosis not present

## 2017-07-19 DIAGNOSIS — F325 Major depressive disorder, single episode, in full remission: Secondary | ICD-10-CM | POA: Diagnosis not present

## 2017-07-19 DIAGNOSIS — Z6841 Body Mass Index (BMI) 40.0 and over, adult: Secondary | ICD-10-CM | POA: Diagnosis not present

## 2017-07-19 DIAGNOSIS — Z23 Encounter for immunization: Secondary | ICD-10-CM | POA: Diagnosis not present

## 2017-07-19 DIAGNOSIS — N182 Chronic kidney disease, stage 2 (mild): Secondary | ICD-10-CM | POA: Diagnosis not present

## 2017-07-19 NOTE — H&P (View-Only) (Signed)
HPI: FU hypertension, dyspnea and chest pain. Echocardiogram March 2016 showed normal LV function, grade 2 diastolic dysfunction and mild right ventricular enlargement. Echocardiogram repeated November 2018 because of new onset congestive heart failure symptoms. Ejection fraction 14-97%, grade 1 diastolic dysfunction, mild left atrial enlargement and mild right ventricular enlargement. RV function mildly reduced. Nuclear study November 2018 showed ejection fraction 31%, diaphragmatic attenuation and mild inferior ischemia could not be excluded. Since last seen, patient has dyspnea with more vigorous activities but much improved. No orthopnea, PND, pedal edema, chest pain or syncope. He does complain of cough from recent URI.  Current Outpatient Medications  Medication Sig Dispense Refill  . acetaminophen (TYLENOL) 325 MG tablet Take 2 tablets (650 mg total) by mouth every 6 (six) hours as needed for mild pain (or Fever >/= 101).    Marland Kitchen albuterol (PROVENTIL HFA;VENTOLIN HFA) 108 (90 BASE) MCG/ACT inhaler Inhale 2 puffs into the lungs every 4 (four) hours as needed for wheezing or shortness of breath.    . ALPRAZolam (XANAX) 0.5 MG tablet Take 1 tablet (0.5 mg total) by mouth 2 (two) times daily as needed for anxiety. 20 tablet 0  . amLODipine (NORVASC) 10 MG tablet Take 1 tablet (10 mg total) by mouth daily. 30 tablet 3  . aspirin 81 MG chewable tablet Chew 1 tablet (81 mg total) by mouth daily.    Marland Kitchen atorvastatin (LIPITOR) 10 MG tablet Take 10 mg by mouth daily.    . DULoxetine HCl (CYMBALTA PO) Take 1 tablet by mouth daily.    . furosemide (LASIX) 40 MG tablet Take 40 mg by mouth daily.    Marland Kitchen HYDROcodone-acetaminophen (NORCO/VICODIN) 5-325 MG per tablet Take 1 tablet by mouth every 6 (six) hours as needed for moderate pain.     30 tablet 0  . Multiple Vitamin (MULTIVITAMIN) tablet Take 1 tablet by mouth daily.    . NON FORMULARY 1 each by Other route See admin instructions. Use CPAP machine  nightly.    . ramipril (ALTACE) 10 MG capsule Take 1 capsule (10 mg total) by mouth 2 (two) times daily. 60 capsule 1  . traZODone (DESYREL) 50 MG tablet Take 1 tablet by mouth every evening.    . zolpidem (AMBIEN) 5 MG tablet Take 5 mg by mouth at bedtime as needed for sleep.     No current facility-administered medications for this visit.      Past Medical History:  Diagnosis Date  . Abscess of anal and rectal regions   . Acute renal insufficiency   . Asthma   . Chest pain   . Diabetes mellitus    "pre diabetic"- no meds  . DOE (dyspnea on exertion)   . Hemorrhoids, internal   . Hyperlipidemia   . Hypertension   . Sleep apnea     Past Surgical History:  Procedure Laterality Date  . COLONOSCOPY    . INCISION AND DRAINAGE PERIRECTAL ABSCESS  06/09/11  . POLYPECTOMY    . TONSILLECTOMY    . ULNAR COLLATERAL LIGAMENT RECONSTRUCTION  2003   left hand    Social History   Socioeconomic History  . Marital status: Married    Spouse name: Not on file  . Number of children: 3  . Years of education: Not on file  . Highest education level: Not on file  Social Needs  . Financial resource strain: Not on file  . Food insecurity - worry: Not on file  . Food insecurity - inability:  Not on file  . Transportation needs - medical: Not on file  . Transportation needs - non-medical: Not on file  Occupational History  . Not on file  Tobacco Use  . Smoking status: Former Smoker    Types: Cigarettes    Last attempt to quit: 12/15/1995    Years since quitting: 21.6  . Smokeless tobacco: Never Used  Substance and Sexual Activity  . Alcohol use: Yes    Alcohol/week: 1.0 oz    Types: 2 Standard drinks or equivalent per week    Comment: Occasional      . Drug use: No  . Sexual activity: Not on file  Other Topics Concern  . Not on file  Social History Narrative  . Not on file    Family History  Problem Relation Age of Onset  . Diabetes Father   . Heart disease Father   .  Diabetes Mother   . Heart disease Paternal Grandmother   . Colon cancer Neg Hx   . Esophageal cancer Neg Hx   . Rectal cancer Neg Hx   . Stomach cancer Neg Hx     ROS: nonproductive cough but no fevers or chills, productive cough, hemoptysis, dysphasia, odynophagia, melena, hematochezia, dysuria, hematuria, rash, seizure activity, orthopnea, PND, pedal edema, claudication. Remaining systems are negative.  Physical Exam: Well-developed obese in no acute distress.  Skin is warm and dry.  HEENT is normal.  Neck is supple.  Chest is clear to auscultation with normal expansion.  Cardiovascular exam is regular rate and rhythm.  Abdominal exam nontender or distended. No masses palpated. Extremities show no edema. neuro grossly intact  A/P  1 acute on chronic combined systolic/diastolic congestive heart failure-patient's volume status is much improved compared to previous.Continue present dose of Lasix.Continue low sodium diet and fluid restriction.   2 cardiomyopathy-nuclear study most suggestive of nonischemic cardiomyopathy but mild inferior ischemia could not be excluded. Pt also with multiple risk factors including family history and DM. I think definitive evaluation is warranted.Plan cardiac catheterization. The risks and benefits including myocardial infarction, CVA and death discussed and he agrees to proceed. Hold Lasix the day prior to his procedure and the day of. Continue ACE inhibitor. Add carvedilol 6.25 mg twice a day. Titrate medications as tolerated. Repeat echocardiogram 3 months later. If LV function remains decreased would need to consider cardiac catheterization. Check TSH. Note he does not have a history of excessive alcohol use. Hypertension may have also contributed to cardiomyopathy.  3 hypertension-BP elevated; add coreg 6.25 mg BID and follow.  4 hyperlipidemia-continue statin.  5 Morbid obesity-We discussed the importance of weight loss.  Kirk Ruths,  MD

## 2017-07-19 NOTE — Progress Notes (Signed)
HPI: FU hypertension, dyspnea and chest pain. Echocardiogram March 2016 showed normal LV function, grade 2 diastolic dysfunction and mild right ventricular enlargement. Echocardiogram repeated November 2018 because of new onset congestive heart failure symptoms. Ejection fraction 71-06%, grade 1 diastolic dysfunction, mild left atrial enlargement and mild right ventricular enlargement. RV function mildly reduced. Nuclear study November 2018 showed ejection fraction 31%, diaphragmatic attenuation and mild inferior ischemia could not be excluded. Since last seen, patient has dyspnea with more vigorous activities but much improved. No orthopnea, PND, pedal edema, chest pain or syncope. He does complain of cough from recent URI.  Current Outpatient Medications  Medication Sig Dispense Refill  . acetaminophen (TYLENOL) 325 MG tablet Take 2 tablets (650 mg total) by mouth every 6 (six) hours as needed for mild pain (or Fever >/= 101).    Marland Kitchen albuterol (PROVENTIL HFA;VENTOLIN HFA) 108 (90 BASE) MCG/ACT inhaler Inhale 2 puffs into the lungs every 4 (four) hours as needed for wheezing or shortness of breath.    . ALPRAZolam (XANAX) 0.5 MG tablet Take 1 tablet (0.5 mg total) by mouth 2 (two) times daily as needed for anxiety. 20 tablet 0  . amLODipine (NORVASC) 10 MG tablet Take 1 tablet (10 mg total) by mouth daily. 30 tablet 3  . aspirin 81 MG chewable tablet Chew 1 tablet (81 mg total) by mouth daily.    Marland Kitchen atorvastatin (LIPITOR) 10 MG tablet Take 10 mg by mouth daily.    . DULoxetine HCl (CYMBALTA PO) Take 1 tablet by mouth daily.    . furosemide (LASIX) 40 MG tablet Take 40 mg by mouth daily.    Marland Kitchen HYDROcodone-acetaminophen (NORCO/VICODIN) 5-325 MG per tablet Take 1 tablet by mouth every 6 (six) hours as needed for moderate pain.     30 tablet 0  . Multiple Vitamin (MULTIVITAMIN) tablet Take 1 tablet by mouth daily.    . NON FORMULARY 1 each by Other route See admin instructions. Use CPAP machine  nightly.    . ramipril (ALTACE) 10 MG capsule Take 1 capsule (10 mg total) by mouth 2 (two) times daily. 60 capsule 1  . traZODone (DESYREL) 50 MG tablet Take 1 tablet by mouth every evening.    . zolpidem (AMBIEN) 5 MG tablet Take 5 mg by mouth at bedtime as needed for sleep.     No current facility-administered medications for this visit.      Past Medical History:  Diagnosis Date  . Abscess of anal and rectal regions   . Acute renal insufficiency   . Asthma   . Chest pain   . Diabetes mellitus    "pre diabetic"- no meds  . DOE (dyspnea on exertion)   . Hemorrhoids, internal   . Hyperlipidemia   . Hypertension   . Sleep apnea     Past Surgical History:  Procedure Laterality Date  . COLONOSCOPY    . INCISION AND DRAINAGE PERIRECTAL ABSCESS  06/09/11  . POLYPECTOMY    . TONSILLECTOMY    . ULNAR COLLATERAL LIGAMENT RECONSTRUCTION  2003   left hand    Social History   Socioeconomic History  . Marital status: Married    Spouse name: Not on file  . Number of children: 3  . Years of education: Not on file  . Highest education level: Not on file  Social Needs  . Financial resource strain: Not on file  . Food insecurity - worry: Not on file  . Food insecurity - inability:  Not on file  . Transportation needs - medical: Not on file  . Transportation needs - non-medical: Not on file  Occupational History  . Not on file  Tobacco Use  . Smoking status: Former Smoker    Types: Cigarettes    Last attempt to quit: 12/15/1995    Years since quitting: 21.6  . Smokeless tobacco: Never Used  Substance and Sexual Activity  . Alcohol use: Yes    Alcohol/week: 1.0 oz    Types: 2 Standard drinks or equivalent per week    Comment: Occasional      . Drug use: No  . Sexual activity: Not on file  Other Topics Concern  . Not on file  Social History Narrative  . Not on file    Family History  Problem Relation Age of Onset  . Diabetes Father   . Heart disease Father   .  Diabetes Mother   . Heart disease Paternal Grandmother   . Colon cancer Neg Hx   . Esophageal cancer Neg Hx   . Rectal cancer Neg Hx   . Stomach cancer Neg Hx     ROS: nonproductive cough but no fevers or chills, productive cough, hemoptysis, dysphasia, odynophagia, melena, hematochezia, dysuria, hematuria, rash, seizure activity, orthopnea, PND, pedal edema, claudication. Remaining systems are negative.  Physical Exam: Well-developed obese in no acute distress.  Skin is warm and dry.  HEENT is normal.  Neck is supple.  Chest is clear to auscultation with normal expansion.  Cardiovascular exam is regular rate and rhythm.  Abdominal exam nontender or distended. No masses palpated. Extremities show no edema. neuro grossly intact  A/P  1 acute on chronic combined systolic/diastolic congestive heart failure-patient's volume status is much improved compared to previous.Continue present dose of Lasix.Continue low sodium diet and fluid restriction.   2 cardiomyopathy-nuclear study most suggestive of nonischemic cardiomyopathy but mild inferior ischemia could not be excluded. Pt also with multiple risk factors including family history and DM. I think definitive evaluation is warranted.Plan cardiac catheterization. The risks and benefits including myocardial infarction, CVA and death discussed and he agrees to proceed. Hold Lasix the day prior to his procedure and the day of. Continue ACE inhibitor. Add carvedilol 6.25 mg twice a day. Titrate medications as tolerated. Repeat echocardiogram 3 months later. If LV function remains decreased would need to consider cardiac catheterization. Check TSH. Note he does not have a history of excessive alcohol use. Hypertension may have also contributed to cardiomyopathy.  3 hypertension-BP elevated; add coreg 6.25 mg BID and follow.  4 hyperlipidemia-continue statin.  5 Morbid obesity-We discussed the importance of weight loss.  Kirk Ruths,  MD

## 2017-07-21 NOTE — Telephone Encounter (Signed)
pt aware of results, Follow up scheduled

## 2017-07-22 ENCOUNTER — Telehealth (HOSPITAL_COMMUNITY): Payer: Self-pay

## 2017-07-22 NOTE — Telephone Encounter (Signed)
Encounter complete. 

## 2017-07-27 ENCOUNTER — Ambulatory Visit (HOSPITAL_COMMUNITY)
Admission: RE | Admit: 2017-07-27 | Discharge: 2017-07-27 | Disposition: A | Discharge status: Home / Self Care | Payer: Medicare Other | Source: Ambulatory Visit | Attending: Cardiology | Admitting: Cardiology

## 2017-07-27 DIAGNOSIS — Z87891 Personal history of nicotine dependence: Secondary | ICD-10-CM | POA: Diagnosis not present

## 2017-07-27 DIAGNOSIS — E785 Hyperlipidemia, unspecified: Secondary | ICD-10-CM | POA: Insufficient documentation

## 2017-07-27 DIAGNOSIS — I509 Heart failure, unspecified: Secondary | ICD-10-CM | POA: Diagnosis not present

## 2017-07-27 DIAGNOSIS — G4733 Obstructive sleep apnea (adult) (pediatric): Secondary | ICD-10-CM | POA: Insufficient documentation

## 2017-07-27 DIAGNOSIS — R072 Precordial pain: Secondary | ICD-10-CM | POA: Diagnosis not present

## 2017-07-27 DIAGNOSIS — Z6841 Body Mass Index (BMI) 40.0 and over, adult: Secondary | ICD-10-CM | POA: Diagnosis not present

## 2017-07-27 DIAGNOSIS — Z8249 Family history of ischemic heart disease and other diseases of the circulatory system: Secondary | ICD-10-CM | POA: Diagnosis not present

## 2017-07-27 DIAGNOSIS — R7303 Prediabetes: Secondary | ICD-10-CM | POA: Diagnosis not present

## 2017-07-27 DIAGNOSIS — R0609 Other forms of dyspnea: Secondary | ICD-10-CM | POA: Insufficient documentation

## 2017-07-27 DIAGNOSIS — J45909 Unspecified asthma, uncomplicated: Secondary | ICD-10-CM | POA: Diagnosis not present

## 2017-07-27 DIAGNOSIS — I110 Hypertensive heart disease with heart failure: Secondary | ICD-10-CM | POA: Insufficient documentation

## 2017-07-27 MED ORDER — REGADENOSON 0.4 MG/5ML IV SOLN
0.4000 mg | Freq: Once | INTRAVENOUS | Status: AC
  Administered 2017-07-27: 0.4 mg via INTRAVENOUS

## 2017-07-27 MED ORDER — TECHNETIUM TC 99M TETROFOSMIN IV KIT
30.2000 | PACK | Freq: Once | INTRAVENOUS | Status: AC | PRN
  Administered 2017-07-27: 30.2 via INTRAVENOUS
  Filled 2017-07-27: qty 31

## 2017-07-27 MED ORDER — AMINOPHYLLINE 25 MG/ML IV SOLN
75.0000 mg | Freq: Once | INTRAVENOUS | Status: AC
  Administered 2017-07-27: 75 mg via INTRAVENOUS

## 2017-07-28 ENCOUNTER — Ambulatory Visit (HOSPITAL_COMMUNITY)
Admission: RE | Admit: 2017-07-28 | Discharge: 2017-07-28 | Disposition: A | Discharge status: Home / Self Care | Payer: Medicare Other | Source: Ambulatory Visit | Attending: Cardiology | Admitting: Cardiology

## 2017-07-28 LAB — MYOCARDIAL PERFUSION IMAGING
CHL CUP NUCLEAR SDS: 1
CHL CUP RESTING HR STRESS: 98 {beats}/min
CSEPPHR: 117 {beats}/min
LV dias vol: 227 mL (ref 62–150)
LVSYSVOL: 158 mL
NUC STRESS TID: 1.02
SRS: 3
SSS: 4

## 2017-07-28 MED ORDER — TECHNETIUM TC 99M TETROFOSMIN IV KIT
29.7000 | PACK | Freq: Once | INTRAVENOUS | Status: AC | PRN
  Administered 2017-07-28: 29.7 via INTRAVENOUS

## 2017-08-02 ENCOUNTER — Encounter: Payer: Self-pay | Admitting: Cardiology

## 2017-08-02 ENCOUNTER — Ambulatory Visit (INDEPENDENT_AMBULATORY_CARE_PROVIDER_SITE_OTHER): Payer: Medicare Other | Admitting: Cardiology

## 2017-08-02 VITALS — BP 143/92 | HR 78 | Ht 70.0 in | Wt 326.2 lb

## 2017-08-02 DIAGNOSIS — E7800 Pure hypercholesterolemia, unspecified: Secondary | ICD-10-CM | POA: Diagnosis not present

## 2017-08-02 DIAGNOSIS — I10 Essential (primary) hypertension: Secondary | ICD-10-CM

## 2017-08-02 DIAGNOSIS — Z01812 Encounter for preprocedural laboratory examination: Secondary | ICD-10-CM

## 2017-08-02 DIAGNOSIS — I509 Heart failure, unspecified: Secondary | ICD-10-CM

## 2017-08-02 DIAGNOSIS — I420 Dilated cardiomyopathy: Secondary | ICD-10-CM

## 2017-08-02 MED ORDER — CARVEDILOL 6.25 MG PO TABS: 6.2500 mg | ORAL_TABLET | Freq: Two times a day (BID) | ORAL | 3 refills | Status: DC

## 2017-08-02 NOTE — Patient Instructions (Addendum)
Medication Instructions:  Your physician has recommended you make the following change in your medication:  1) START Coreg 6.25mg  tablet by mouth TWICE daily   Labwork: Your physician recommends that you return for lab work in: TODAY   Testing/Procedures: Your physician has requested that you have a cardiac catheterization. Cardiac catheterization is used to diagnose and/or treat various heart conditions. Doctors may recommend this procedure for a number of different reasons. The most common reason is to evaluate chest pain. Chest pain can be a symptom of coronary artery disease (CAD), and cardiac catheterization can show whether plaque is narrowing or blocking your heart's arteries. This procedure is also used to evaluate the valves, as well as measure the blood flow and oxygen levels in different parts of your heart. For further information please visit [facility]ugeFiesta[facility][email or web address]. Please follow instruction sheet, as given.   HOLD Lasix day before procedure and morning  Follow-Up: Your physician recommends that you schedule a follow-up appointment in: 8 weeks with Dr. Stanford Breed.   Any Other Special Instructions Will Be Listed Below (If Applicable).     If you need a refill on your cardiac medications before your next appointment, please call your pharmacy.

## 2017-08-03 LAB — BASIC METABOLIC PANEL
BUN / CREAT RATIO: 10 (ref 9–20)
BUN: 11 mg/dL (ref 6–24)
CHLORIDE: 106 mmol/L (ref 96–106)
CO2: 17 mmol/L — ABNORMAL LOW (ref 20–29)
Calcium: 9.1 mg/dL (ref 8.7–10.2)
Creatinine, Ser: 1.14 mg/dL (ref 0.76–1.27)
GFR calc Af Amer: 84 mL/min/{1.73_m2} (ref >59)
GFR calc non Af Amer: 73 mL/min/{1.73_m2} (ref >59)
GLUCOSE: 131 mg/dL — AB (ref 65–99)
POTASSIUM: 4.1 mmol/L (ref 3.5–5.2)
SODIUM: 141 mmol/L (ref 134–144)

## 2017-08-03 LAB — CBC WITH DIFFERENTIAL/PLATELET
BASOS ABS: 0 10*3/uL (ref 0.0–0.2)
Basos: 0 %
EOS (ABSOLUTE): 0.4 10*3/uL (ref 0.0–0.4)
Eos: 4 %
Hematocrit: 42 % (ref 37.5–51.0)
Hemoglobin: 14.1 g/dL (ref 13.0–17.7)
IMMATURE GRANS (ABS): 0 10*3/uL (ref 0.0–0.1)
Immature Granulocytes: 0 %
LYMPHS ABS: 1.6 10*3/uL (ref 0.7–3.1)
LYMPHS: 17 %
MCH: 26.9 pg (ref 26.6–33.0)
MCHC: 33.6 g/dL (ref 31.5–35.7)
MCV: 80 fL (ref 79–97)
Monocytes Absolute: 0.6 10*3/uL (ref 0.1–0.9)
Monocytes: 6 %
NEUTROS ABS: 7.2 10*3/uL — AB (ref 1.4–7.0)
Neutrophils: 73 %
PLATELETS: 199 10*3/uL (ref 150–379)
RBC: 5.24 x10E6/uL (ref 4.14–5.80)
RDW: 15.1 % (ref 12.3–15.4)
WBC: 9.8 10*3/uL (ref 3.4–10.8)

## 2017-08-03 LAB — APTT: APTT: 27 s (ref 24–33)

## 2017-08-03 LAB — PROTIME-INR
INR: 1.1 (ref 0.8–1.2)
Prothrombin Time: 11 s (ref 9.1–12.0)

## 2017-08-03 LAB — TSH: TSH: 1.45 u[IU]/mL (ref 0.450–4.500)

## 2017-08-05 ENCOUNTER — Telehealth: Payer: Self-pay

## 2017-08-05 NOTE — Telephone Encounter (Signed)
Patient contacted pre-catheterization at [facility] scheduled for:  08/06/2017 @ 0730 Verified arrival time and place:  [facility] @ 0530 Confirmed AM meds to be taken pre-cath with sip of water: Take ASA Hold lasix Thurs/am of procedure Confirmed patient has responsible person to drive home post procedure and observe patient for 24 hours:  yes Addl concerns:  none

## 2017-08-06 ENCOUNTER — Ambulatory Visit (HOSPITAL_COMMUNITY)
Admission: RE | Admit: 2017-08-06 | Discharge: 2017-08-06 | Disposition: A | Discharge status: Home / Self Care | Payer: Medicare Other | Source: Ambulatory Visit | Attending: Cardiovascular Disease | Admitting: Cardiovascular Disease

## 2017-08-06 ENCOUNTER — Encounter (HOSPITAL_COMMUNITY)
Admission: RE | Disposition: A | Discharge status: Home / Self Care | Payer: Self-pay | Source: Ambulatory Visit | Attending: Cardiovascular Disease

## 2017-08-06 ENCOUNTER — Encounter (HOSPITAL_COMMUNITY): Payer: Self-pay | Admitting: Cardiovascular Disease

## 2017-08-06 DIAGNOSIS — G4730 Sleep apnea, unspecified: Secondary | ICD-10-CM | POA: Insufficient documentation

## 2017-08-06 DIAGNOSIS — R7303 Prediabetes: Secondary | ICD-10-CM | POA: Diagnosis not present

## 2017-08-06 DIAGNOSIS — J45909 Unspecified asthma, uncomplicated: Secondary | ICD-10-CM | POA: Insufficient documentation

## 2017-08-06 DIAGNOSIS — I110 Hypertensive heart disease with heart failure: Secondary | ICD-10-CM | POA: Diagnosis not present

## 2017-08-06 DIAGNOSIS — Z6841 Body Mass Index (BMI) 40.0 and over, adult: Secondary | ICD-10-CM | POA: Insufficient documentation

## 2017-08-06 DIAGNOSIS — Z87891 Personal history of nicotine dependence: Secondary | ICD-10-CM | POA: Diagnosis not present

## 2017-08-06 DIAGNOSIS — E785 Hyperlipidemia, unspecified: Secondary | ICD-10-CM | POA: Insufficient documentation

## 2017-08-06 DIAGNOSIS — Z7982 Long term (current) use of aspirin: Secondary | ICD-10-CM | POA: Insufficient documentation

## 2017-08-06 DIAGNOSIS — I428 Other cardiomyopathies: Secondary | ICD-10-CM

## 2017-08-06 DIAGNOSIS — I5043 Acute on chronic combined systolic (congestive) and diastolic (congestive) heart failure: Secondary | ICD-10-CM | POA: Diagnosis not present

## 2017-08-06 DIAGNOSIS — I420 Dilated cardiomyopathy: Secondary | ICD-10-CM | POA: Insufficient documentation

## 2017-08-06 HISTORY — PX: LEFT HEART CATH AND CORONARY ANGIOGRAPHY: CATH118249

## 2017-08-06 LAB — GLUCOSE, CAPILLARY: Glucose-Capillary: 135 mg/dL — ABNORMAL HIGH (ref 65–99)

## 2017-08-06 SURGERY — LEFT HEART CATH AND CORONARY ANGIOGRAPHY
Anesthesia: LOCAL

## 2017-08-06 MED ORDER — LIDOCAINE HCL (PF) 1 % IJ SOLN
INTRAMUSCULAR | Status: DC | PRN
  Administered 2017-08-06: 10 mL

## 2017-08-06 MED ORDER — SODIUM CHLORIDE 0.9 % IV SOLN: 250.0000 mL | INTRAVENOUS | Status: DC | PRN

## 2017-08-06 MED ORDER — VERAPAMIL HCL 2.5 MG/ML IV SOLN
INTRAVENOUS | Status: DC | PRN
  Administered 2017-08-06: 10 mL via INTRA_ARTERIAL

## 2017-08-06 MED ORDER — MIDAZOLAM HCL 2 MG/2ML IJ SOLN
INTRAMUSCULAR | Status: DC | PRN
  Administered 2017-08-06: 1 mg via INTRAVENOUS

## 2017-08-06 MED ORDER — SODIUM CHLORIDE 0.9% FLUSH: 3.0000 mL | INTRAVENOUS | Status: DC | PRN

## 2017-08-06 MED ORDER — ASPIRIN 81 MG PO CHEW: 81.0000 mg | CHEWABLE_TABLET | ORAL | Status: DC

## 2017-08-06 MED ORDER — SODIUM CHLORIDE 0.9% FLUSH: 3.0000 mL | Freq: Two times a day (BID) | INTRAVENOUS | Status: DC

## 2017-08-06 MED ORDER — VERAPAMIL HCL 2.5 MG/ML IV SOLN
INTRAVENOUS | Status: AC
  Filled 2017-08-06: qty 2

## 2017-08-06 MED ORDER — FENTANYL CITRATE (PF) 100 MCG/2ML IJ SOLN
INTRAMUSCULAR | Status: DC | PRN
  Administered 2017-08-06: 25 ug via INTRAVENOUS

## 2017-08-06 MED ORDER — HEPARIN SODIUM (PORCINE) 1000 UNIT/ML IJ SOLN
INTRAMUSCULAR | Status: DC | PRN
  Administered 2017-08-06: 6000 [IU] via INTRAVENOUS

## 2017-08-06 MED ORDER — FENTANYL CITRATE (PF) 100 MCG/2ML IJ SOLN
INTRAMUSCULAR | Status: AC
  Filled 2017-08-06: qty 2

## 2017-08-06 MED ORDER — SODIUM CHLORIDE 0.9 % IV SOLN: INTRAVENOUS | Status: DC

## 2017-08-06 MED ORDER — MIDAZOLAM HCL 2 MG/2ML IJ SOLN
INTRAMUSCULAR | Status: AC
  Filled 2017-08-06: qty 2

## 2017-08-06 MED ORDER — IOPAMIDOL (ISOVUE-370) INJECTION 76%
INTRAVENOUS | Status: DC | PRN
  Administered 2017-08-06: 85 mL via INTRA_ARTERIAL

## 2017-08-06 MED ORDER — HEPARIN (PORCINE) IN NACL 2-0.9 UNIT/ML-% IJ SOLN
INTRAMUSCULAR | Status: AC
  Filled 2017-08-06: qty 1000

## 2017-08-06 MED ORDER — IOPAMIDOL (ISOVUE-370) INJECTION 76%
INTRAVENOUS | Status: AC
  Filled 2017-08-06: qty 100

## 2017-08-06 MED ORDER — SODIUM CHLORIDE 0.9 % IV SOLN
INTRAVENOUS | Status: AC
  Administered 2017-08-06: 09:00:00 via INTRAVENOUS

## 2017-08-06 MED ORDER — LIDOCAINE HCL (PF) 1 % IJ SOLN
INTRAMUSCULAR | Status: AC
  Filled 2017-08-06: qty 30

## 2017-08-06 MED ORDER — HEPARIN (PORCINE) IN NACL 2-0.9 UNIT/ML-% IJ SOLN
INTRAMUSCULAR | Status: AC | PRN
  Administered 2017-08-06: 1000 mL

## 2017-08-06 MED ORDER — HEPARIN SODIUM (PORCINE) 1000 UNIT/ML IJ SOLN
INTRAMUSCULAR | Status: AC
  Filled 2017-08-06: qty 1

## 2017-08-06 SURGICAL SUPPLY — 11 items
CATH INFINITI 5 FR JL3.5 (CATHETERS) ×2 IMPLANT
CATH INFINITI JR4 5F (CATHETERS) ×2 IMPLANT
DEVICE RAD COMP TR BAND LRG (VASCULAR PRODUCTS) ×2 IMPLANT
GLIDESHEATH SLEND SS 6F .021 (SHEATH) ×2 IMPLANT
GUIDEWIRE INQWIRE 1.5J.035X260 (WIRE) ×1 IMPLANT
HOVERMATT SINGLE USE (MISCELLANEOUS) ×2 IMPLANT
INQWIRE 1.5J .035X260CM (WIRE) ×2
KIT HEART LEFT (KITS) ×2 IMPLANT
PACK CARDIAC CATHETERIZATION (CUSTOM PROCEDURE TRAY) ×2 IMPLANT
TRANSDUCER W/STOPCOCK (MISCELLANEOUS) ×2 IMPLANT
TUBING CIL FLEX 10 FLL-RA (TUBING) ×2 IMPLANT

## 2017-08-06 NOTE — Discharge Instructions (Signed)

## 2017-08-06 NOTE — Interval H&P Note (Signed)
History and Physical Interval Note:  08/06/2017 7:19 AM  [name]  has presented today for cardiac cath with the diagnosis of cardiomyopathy  The various methods of treatment have been discussed with the patient and family. After consideration of risks, benefits and other options for treatment, the patient has consented to  Procedure(s): LEFT HEART CATH AND CORONARY ANGIOGRAPHY (N/A) as a surgical intervention .  The patient's history has been reviewed, patient examined, no change in status, stable for surgery.  I have reviewed the patient's chart and labs.  Questions were answered to the patient's satisfaction.    Cath Lab Visit (complete for each Cath Lab visit)  Clinical Evaluation Leading to the Procedure:   ACS: No.  Non-ACS:    Anginal Classification: CCS II  Anti-ischemic medical therapy: Maximal Therapy (2 or more classes of medications)  Non-Invasive Test Results: High-risk stress test findings: cardiac mortality >3%/year  Prior CABG: No previous CABG         Lauree Chandler

## 2017-08-09 ENCOUNTER — Telehealth: Payer: Self-pay | Admitting: Cardiology

## 2017-08-09 NOTE — Telephone Encounter (Signed)
Pt of Dr. Stanford Breed  Hx HTN, dilated cardiomyopathy No A Fib hx noted  11/20 High risk nuclear study 11/26 OV  11/30 Left heart cath  States after he came home from cath, he "feels fine" but every time he checks his BP on home machine it shows "irregular heartbeat".  BPs OK - this AM 112/80.  He denies SOB, new chest discomfort, palpitations, fatigue.  Pt on norvasc, ramipril for BP control. He was started on coreg at last OV & asked if he should change any meds. I advised to continue current medical therapy.   Added for visit tomorrow AM to see Lurena Joiner.  He will bring BP cuff to appt. Aware to call if new symptoms develop.

## 2017-08-09 NOTE — Telephone Encounter (Signed)
Pt had a Cath on Friday.Wife says every since he had the Cath,he have been having irregular heart beats. She wants to know if this is normal?

## 2017-08-10 ENCOUNTER — Ambulatory Visit (INDEPENDENT_AMBULATORY_CARE_PROVIDER_SITE_OTHER): Payer: Medicare Other | Admitting: Cardiology

## 2017-08-10 ENCOUNTER — Encounter: Payer: Self-pay | Admitting: Cardiology

## 2017-08-10 ENCOUNTER — Encounter (INDEPENDENT_AMBULATORY_CARE_PROVIDER_SITE_OTHER): Payer: Self-pay

## 2017-08-10 DIAGNOSIS — IMO0001 Reserved for inherently not codable concepts without codable children: Secondary | ICD-10-CM | POA: Insufficient documentation

## 2017-08-10 DIAGNOSIS — I428 Other cardiomyopathies: Secondary | ICD-10-CM

## 2017-08-10 DIAGNOSIS — G4733 Obstructive sleep apnea (adult) (pediatric): Secondary | ICD-10-CM | POA: Diagnosis not present

## 2017-08-10 DIAGNOSIS — Z0389 Encounter for observation for other suspected diseases and conditions ruled out: Secondary | ICD-10-CM

## 2017-08-10 DIAGNOSIS — G4730 Sleep apnea, unspecified: Secondary | ICD-10-CM | POA: Insufficient documentation

## 2017-08-10 MED ORDER — LOSARTAN POTASSIUM 50 MG PO TABS: 50.0000 mg | ORAL_TABLET | Freq: Every day | ORAL | 3 refills | Status: DC

## 2017-08-10 NOTE — Assessment & Plan Note (Signed)
Nov 2018 cath

## 2017-08-10 NOTE — Assessment & Plan Note (Signed)
EF 30%-35% Nov 2018-elevated LVEDP at cath

## 2017-08-10 NOTE — Assessment & Plan Note (Signed)
On C-pap for > 15 years

## 2017-08-10 NOTE — Patient Instructions (Signed)
Medication Instructions:   STOP AMLODIPINE  STOP RAMIPRIL  START LOSARTAN 50 MG ONCE DAILY AT BEDTIME  Follow-Up:  Your physician recommends that you schedule a follow-up appointment in: [place]

## 2017-08-10 NOTE — Progress Notes (Signed)
08/10/2017 [place]   1960/12/21  [id number]  Primary Physician Shon Baton, MD Primary Cardiologist: Dr Stanford Breed  HPI:  84 y/p morbidly obese AA male with a history of HTN and HCVD. Echo done 07/15/17 showed an EF of 30-35%. Myoview 07/28/17 could not r/o anterior ischemia and he went for Rt and Lt heart cath 08/06/17. This revealed normal coronaries. His LVEDP was elevated and his Lasix was doubled x 3 days. He is in the office today for follow up. Coreg is new. He says his B/P has been running a little low at home- 100/60. He denies dizziness or pre syncope. His wgt is unchanged despite increasing his Lasix for 3 days. He denies orthopnea. He does have a dry cough that did not improve with OP antibiotics. His home B/P monitor has showed "irregular heart beat", he had PVCs when he was hooked up to the EKG machine.    Current Outpatient Medications  Medication Sig Dispense Refill  . acetaminophen (TYLENOL) 325 MG tablet Take 2 tablets (650 mg total) by mouth every 6 (six) hours as needed for mild pain (or Fever >/= 101).    Marland Kitchen albuterol (PROVENTIL HFA;VENTOLIN HFA) 108 (90 BASE) MCG/ACT inhaler Inhale 2 puffs into the lungs every 4 (four) hours as needed for wheezing or shortness of breath.    . ALPRAZolam (XANAX) 0.5 MG tablet Take 1 tablet (0.5 mg total) by mouth 2 (two) times daily as needed for anxiety. 20 tablet 0  . aspirin 81 MG chewable tablet Chew 1 tablet (81 mg total) by mouth daily.    Marland Kitchen atorvastatin (LIPITOR) 10 MG tablet Take 10 mg by mouth daily.    . carvedilol (COREG) 6.25 MG tablet Take 1 tablet (6.25 mg total) by mouth 2 (two) times daily. 180 tablet 3  . DULoxetine (CYMBALTA) 30 MG capsule Take 1 tablet by mouth daily.    . furosemide (LASIX) 40 MG tablet Take 40 mg by mouth daily.    Marland Kitchen HYDROcodone-acetaminophen (NORCO/VICODIN) 5-325 MG per tablet Take 1 tablet by mouth every 6 (six) hours as needed for moderate pain.     30 tablet 0  . Multiple Vitamin  (MULTIVITAMIN) tablet Take 1 tablet by mouth daily.    . NON FORMULARY 1 each by Other route See admin instructions. Use CPAP machine nightly.    . traZODone (DESYREL) 100 MG tablet Take 1 tablet by mouth every evening.    . zolpidem (AMBIEN) 5 MG tablet Take 5 mg by mouth at bedtime as needed for sleep.    Marland Kitchen losartan (COZAAR) 50 MG tablet Take 1 tablet (50 mg total) by mouth daily. 90 tablet 3   No current facility-administered medications for this visit.     Allergies  Allergen Reactions  . Shellfish Allergy Nausea And Vomiting    Past Medical History:  Diagnosis Date  . Abscess of anal and rectal regions   . Acute renal insufficiency   . Asthma   . Chest pain   . Diabetes mellitus    "pre diabetic"- no meds  . DOE (dyspnea on exertion)   . Hemorrhoids, internal   . Hyperlipidemia   . Hypertension   . Sleep apnea     Social History   Socioeconomic History  . Marital status: Married    Spouse name: Not on file  . Number of children: 3  . Years of education: Not on file  . Highest education level: Not on file  Social Needs  . Financial  resource strain: Not on file  . Food insecurity - worry: Not on file  . Food insecurity - inability: Not on file  . Transportation needs - medical: Not on file  . Transportation needs - non-medical: Not on file  Occupational History  . Not on file  Tobacco Use  . Smoking status: Former Smoker    Types: Cigarettes    Last attempt to quit: 12/15/1995    Years since quitting: 21.6  . Smokeless tobacco: Never Used  Substance and Sexual Activity  . Alcohol use: Yes    Alcohol/week: 1.0 oz    Types: 2 Standard drinks or equivalent per week    Comment: Occasional      . Drug use: No  . Sexual activity: Not on file  Other Topics Concern  . Not on file  Social History Narrative  . Not on file     Family History  Problem Relation Age of Onset  . Diabetes Father   . Heart disease Father   . Diabetes Mother   . Heart disease  Paternal Grandmother   . Colon cancer Neg Hx   . Esophageal cancer Neg Hx   . Rectal cancer Neg Hx   . Stomach cancer Neg Hx      Review of Systems: General: negative for chills, fever, night sweats or weight changes.  Cardiovascular: negative for chest pain, dyspnea on exertion, edema, orthopnea, palpitations, paroxysmal nocturnal dyspnea or shortness of breath Dermatological: negative for rash Respiratory: negative for cough or wheezing Urologic: negative for hematuria Abdominal: negative for nausea, vomiting, diarrhea, bright red blood per rectum, melena, or hematemesis Neurologic: negative for visual changes, syncope, or dizziness All other systems reviewed and are otherwise negative except as noted above.    Blood pressure 122/76, pulse 87, height 5\' 10"  (1.778 m), weight (!) 329 lb 9.6 oz (149.5 kg), SpO2 99 %.  General appearance: alert, cooperative, no distress and morbidly obese Lungs: clear to auscultation bilaterally Heart: regular rate and rhythm Extremities: no edema Skin: Skin color, texture, turgor normal. No rashes or lesions Neurologic: Grossly normal  EKG NSR  ASSESSMENT AND PLAN:   Hypertensive cardiomyopathy, with heart failure (HCC) EF 30%-35% Nov 2018-elevated LVEDP at cath  Morbid obesity ([place]) BMI 47  Sleep apnea On C-pap for > 15 years  Normal coronary arteries Nov 2018 cath   PLAN  Coreg recently added. I suggested we stop his Norvasc and Altace, start Losartan 50 mg Q HS. F/U in two weeks with BMP to start Entresto.   Kerin Ransom PA-C 08/10/2017 9:02 AM

## 2017-08-10 NOTE — Assessment & Plan Note (Signed)
BMI 47 

## 2017-08-18 DIAGNOSIS — I5042 Chronic combined systolic (congestive) and diastolic (congestive) heart failure: Secondary | ICD-10-CM | POA: Diagnosis not present

## 2017-08-18 DIAGNOSIS — Z6841 Body Mass Index (BMI) 40.0 and over, adult: Secondary | ICD-10-CM | POA: Diagnosis not present

## 2017-08-18 DIAGNOSIS — K219 Gastro-esophageal reflux disease without esophagitis: Secondary | ICD-10-CM | POA: Diagnosis not present

## 2017-08-18 DIAGNOSIS — R05 Cough: Secondary | ICD-10-CM | POA: Diagnosis not present

## 2017-09-01 ENCOUNTER — Encounter: Payer: Self-pay | Admitting: Cardiology

## 2017-09-01 ENCOUNTER — Ambulatory Visit (INDEPENDENT_AMBULATORY_CARE_PROVIDER_SITE_OTHER): Payer: Medicare Other | Admitting: Cardiology

## 2017-09-01 VITALS — BP 134/88 | HR 87 | Ht 70.0 in | Wt 334.6 lb

## 2017-09-01 DIAGNOSIS — Z79899 Other long term (current) drug therapy: Secondary | ICD-10-CM

## 2017-09-01 DIAGNOSIS — G4733 Obstructive sleep apnea (adult) (pediatric): Secondary | ICD-10-CM

## 2017-09-01 DIAGNOSIS — I10 Essential (primary) hypertension: Secondary | ICD-10-CM | POA: Diagnosis not present

## 2017-09-01 DIAGNOSIS — Z0389 Encounter for observation for other suspected diseases and conditions ruled out: Secondary | ICD-10-CM | POA: Diagnosis not present

## 2017-09-01 DIAGNOSIS — I428 Other cardiomyopathies: Secondary | ICD-10-CM | POA: Diagnosis not present

## 2017-09-01 DIAGNOSIS — IMO0001 Reserved for inherently not codable concepts without codable children: Secondary | ICD-10-CM

## 2017-09-01 MED ORDER — SACUBITRIL-VALSARTAN 49-51 MG PO TABS: 1.0000 | ORAL_TABLET | Freq: Two times a day (BID) | ORAL | Status: DC

## 2017-09-01 NOTE — Assessment & Plan Note (Signed)
Currently controlled.

## 2017-09-01 NOTE — Assessment & Plan Note (Signed)
Nov 2018 cath

## 2017-09-01 NOTE — Assessment & Plan Note (Addendum)
Felt to be secondary to HTN. EF 30%-35% Nov 2018 Will transition to [facility]

## 2017-09-01 NOTE — Assessment & Plan Note (Signed)
BMI 48 

## 2017-09-01 NOTE — Progress Notes (Signed)
09/01/2017 [place]   1961/05/30  [id number]  Primary Physician Shon Baton, MD Primary Cardiologist: Dr Stanford Breed  HPI:  18 y/p morbidly obese AA male with a history of HTN and HCVD. Echo done 07/15/17 showed an EF of 30-35%. Myoview 07/28/17 could not r/o anterior ischemia and he went for Rt and Lt heart cath 08/06/17. This revealed normal coronaries. His LVEDP was elevated and his Lasix was doubled x 3 days. Coreg was added. I saw him in the office in f/u 08/10/17. I switched him from Lisinopril (he had c/o of a cough) to Losartan 50 mg.   I'm seeing him today in f/u and to start transition to [facility]. He has done well since I last saw him, no orthopnea. He has gained 3-4 pounds but he attributes that to the [place]. His B/P is stable. He still has a dry cough but no unusual dyspnea.    Current Outpatient Medications  Medication Sig Dispense Refill  . acetaminophen (TYLENOL) 325 MG tablet Take 2 tablets (650 mg total) by mouth every 6 (six) hours as needed for mild pain (or Fever >/= 101).    Marland Kitchen albuterol (PROVENTIL HFA;VENTOLIN HFA) 108 (90 BASE) MCG/ACT inhaler Inhale 2 puffs into the lungs every 4 (four) hours as needed for wheezing or shortness of breath.    . ALPRAZolam (XANAX) 0.5 MG tablet Take 1 tablet (0.5 mg total) by mouth 2 (two) times daily as needed for anxiety. 20 tablet 0  . aspirin 81 MG chewable tablet Chew 1 tablet (81 mg total) by mouth daily.    Marland Kitchen atorvastatin (LIPITOR) 10 MG tablet Take 10 mg by mouth daily.    . carvedilol (COREG) 6.25 MG tablet Take 1 tablet (6.25 mg total) by mouth 2 (two) times daily. 180 tablet 3  . DULoxetine (CYMBALTA) 30 MG capsule Take 1 tablet by mouth daily.    . furosemide (LASIX) 40 MG tablet Take 40 mg by mouth daily.    Marland Kitchen HYDROcodone-acetaminophen (NORCO/VICODIN) 5-325 MG per tablet Take 1 tablet by mouth every 6 (six) hours as needed for moderate pain.     30 tablet 0  . Multiple Vitamin (MULTIVITAMIN) tablet Take 1  tablet by mouth daily.    . NON FORMULARY 1 each by Other route See admin instructions. Use CPAP machine nightly.    Marland Kitchen omeprazole (PRILOSEC) 20 MG capsule Take 20 mg by mouth 2 (two) times daily.    . traZODone (DESYREL) 100 MG tablet Take 1 tablet by mouth every evening.    . zolpidem (AMBIEN) 5 MG tablet Take 5 mg by mouth at bedtime as needed for sleep.    . sacubitril-valsartan (ENTRESTO) 49-51 MG Take 1 tablet by mouth 2 (two) times daily. 60 tablet    No current facility-administered medications for this visit.     Allergies  Allergen Reactions  . Shellfish Allergy Nausea And Vomiting    Past Medical History:  Diagnosis Date  . Abscess of anal and rectal regions   . Acute renal insufficiency   . Asthma   . Chest pain   . Diabetes mellitus    "pre diabetic"- no meds  . DOE (dyspnea on exertion)   . Hemorrhoids, internal   . Hyperlipidemia   . Hypertension   . Sleep apnea     Social History   Socioeconomic History  . Marital status: Married    Spouse name: Not on file  . Number of children: 3  . Years of education: Not  on file  . Highest education level: Not on file  Social Needs  . Financial resource strain: Not on file  . Food insecurity - worry: Not on file  . Food insecurity - inability: Not on file  . Transportation needs - medical: Not on file  . Transportation needs - non-medical: Not on file  Occupational History  . Not on file  Tobacco Use  . Smoking status: Former Smoker    Types: Cigarettes    Last attempt to quit: 12/15/1995    Years since quitting: 21.7  . Smokeless tobacco: Never Used  Substance and Sexual Activity  . Alcohol use: Yes    Alcohol/week: 1.0 oz    Types: 2 Standard drinks or equivalent per week    Comment: Occasional      . Drug use: No  . Sexual activity: Not on file  Other Topics Concern  . Not on file  Social History Narrative  . Not on file     Family History  Problem Relation Age of Onset  . Diabetes Father   .  Heart disease Father   . Diabetes Mother   . Heart disease Paternal Grandmother   . Colon cancer Neg Hx   . Esophageal cancer Neg Hx   . Rectal cancer Neg Hx   . Stomach cancer Neg Hx      Review of Systems: General: negative for chills, fever, night sweats or weight changes.  Cardiovascular: negative for chest pain, dyspnea on exertion, edema, orthopnea, palpitations, paroxysmal nocturnal dyspnea or shortness of breath Dermatological: negative for rash Respiratory: negative for cough or wheezing Urologic: negative for hematuria Abdominal: negative for nausea, vomiting, diarrhea, bright red blood per rectum, melena, or hematemesis Neurologic: negative for visual changes, syncope, or dizziness All other systems reviewed and are otherwise negative except as noted above.    Blood pressure 134/88, pulse 87, height 5\' 10"  (1.778 m), weight (!) 334 lb 9.6 oz (151.8 kg), SpO2 97 %.  General appearance: alert, cooperative, no distress and morbidly obese Neck: no carotid bruit and no JVD Lungs: clear to auscultation bilaterally Heart: regular rate and rhythm Extremities: no edema, walks with a cane Skin: Skin color, texture, turgor normal. No rashes or lesions Neurologic: Grossly normal   ASSESSMENT AND PLAN:   NICM (nonischemic cardiomyopathy) (HCC) Felt to be secondary to HTN. EF 30%-35% Nov 2018 Will transition to [facility]  Normal coronary arteries Nov 2018 cath  Hypertension, malignant He was initially a little hypotensive when discharged.  Currently controlled, tolerating Cozaar and Coreg  Morbid obesity ([place]) BMI 48  Sleep apnea On C-pap for > 15 years   PLAN  Will start Entresto 49/51 and stop Cozaar. He'll have a BMP and return in two weeks to see our pharmacist for further adjustment of his medications. We may be able to increase his Coreg as well then. He'll need an echo in Feb 2019. He knows to call us if his wgt gets closer to 340 lbs, I suspect his current "dry  wgt" is 330-335 lbs.   Kerin Ransom PA-C 09/01/2017 11:14 AM

## 2017-09-01 NOTE — Patient Instructions (Addendum)
Medication Instructions: Stop the Losartan START Entresto 49-51 mg twice daily.   If you need a refill on your cardiac medications before your next appointment, please call your pharmacy.   Testing: Your physician has requested that you have an echocardiogram in February. Echocardiography is a painless test that uses sound waves to create images of your heart. It provides your doctor with information about the size and shape of your heart and how well your heart's chambers and valves are working. This procedure takes approximately one hour. There are no restrictions for this procedure. This will be done at [address].   Labwork: Your physician recommends that you return for lab work in: 2 weeks for a BMET. Please have this done before your 2 week check up.   Follow-Up: Your physician wants you to follow-up in: 2 weeks with pharmd for Entresto adjustment.  Please follow up in 6 months with Dr. Stanford Breed. You will receive a reminder letter in the mail two months in advance. If you don't receive a letter, please call our office at [phone] to schedule this follow-up appointment.   Thank you for choosing [facility] at [facility]!!

## 2017-09-01 NOTE — Assessment & Plan Note (Signed)
On C-pap for > 15 years

## 2017-09-13 DIAGNOSIS — I10 Essential (primary) hypertension: Secondary | ICD-10-CM | POA: Diagnosis not present

## 2017-09-13 DIAGNOSIS — Z79899 Other long term (current) drug therapy: Secondary | ICD-10-CM | POA: Diagnosis not present

## 2017-09-13 LAB — BASIC METABOLIC PANEL
BUN/Creatinine Ratio: 13 (ref 9–20)
BUN: 16 mg/dL (ref 6–24)
CO2: 21 mmol/L (ref 20–29)
Calcium: 9.3 mg/dL (ref 8.7–10.2)
Chloride: 101 mmol/L (ref 96–106)
Creatinine, Ser: 1.21 mg/dL (ref 0.76–1.27)
GFR calc Af Amer: 78 mL/min/{1.73_m2} (ref >59)
GFR calc non Af Amer: 67 mL/min/{1.73_m2} (ref >59)
Glucose: 187 mg/dL — ABNORMAL HIGH (ref 65–99)
Potassium: 4.5 mmol/L (ref 3.5–5.2)
Sodium: 138 mmol/L (ref 134–144)

## 2017-09-15 ENCOUNTER — Other Ambulatory Visit (HOSPITAL_COMMUNITY): Payer: Medicare Other

## 2017-09-15 NOTE — Progress Notes (Signed)
Patient ID: [name]                 DOB: Mar 23, 1961                      MRN: [id number]     HPI: [name] is a 57 y.o. male patient of Dr Stanford Breed referred by Rico Sheehan PA-C to pharmacist clinic. PMH includes hypertension, cardiomyopathy with EF 30-35%, pre-diabetes, hyperlipidemia , asthma, and sleep apnea. Patient was transition from losartan to [facility] 49/51mg  twice daily on 09/01/2017. Repeat BMET on 09/13/2016 showed stable renal function and electrolytes. Patient presents today for further medication titration. Denies swelling, chest pain, headaches, or dizziness. Only complain is persistent cough but is following up with PCP and pulmonologist.  Not change noted in cough after change from losartan to [facility].  Current HTN meds:  Carvedilol 6.25mg  twice daily Furosemide 40mg  daily Entresto 49-51 twice daily  BP goal: < 130/80  Family History: diabetes from mother and father; heart disease from father  Social History: former smoker, occasional alcohol intake, denies any other drug use  Diet: low sodium , mainly food cooked meals  Exercise: walking more but limited chronic knee pain  Home BP readings: 105/72 at home per patient history, No records available today  Wt Readings from Last 3 Encounters:  09/16/17 (!) 334 lb 12.8 oz (151.9 kg)  09/01/17 (!) 334 lb 9.6 oz (151.8 kg)  08/10/17 (!) 329 lb 9.6 oz (149.5 kg)   BP Readings from Last 3 Encounters:  09/16/17 102/70  09/01/17 134/88  08/10/17 122/76   Pulse Readings from Last 3 Encounters:  09/16/17 96  09/01/17 87  08/10/17 87    Past Medical History:  Diagnosis Date  . Abscess of anal and rectal regions   . Acute renal insufficiency   . Asthma   . Chest pain   . Diabetes mellitus    "pre diabetic"- no meds  . DOE (dyspnea on exertion)   . Hemorrhoids, internal   . Hyperlipidemia   . Hypertension   . Sleep apnea     Current Outpatient Medications on File Prior to Visit  Medication Sig  Dispense Refill  . acetaminophen (TYLENOL) 325 MG tablet Take 2 tablets (650 mg total) by mouth every 6 (six) hours as needed for mild pain (or Fever >/= 101).    Marland Kitchen albuterol (PROVENTIL HFA;VENTOLIN HFA) 108 (90 BASE) MCG/ACT inhaler Inhale 2 puffs into the lungs every 4 (four) hours as needed for wheezing or shortness of breath.    . ALPRAZolam (XANAX) 0.5 MG tablet Take 1 tablet (0.5 mg total) by mouth 2 (two) times daily as needed for anxiety. 20 tablet 0  . aspirin 81 MG chewable tablet Chew 1 tablet (81 mg total) by mouth daily.    Marland Kitchen atorvastatin (LIPITOR) 10 MG tablet Take 10 mg by mouth daily.    . carvedilol (COREG) 6.25 MG tablet Take 1 tablet (6.25 mg total) by mouth 2 (two) times daily. 180 tablet 3  . DULoxetine (CYMBALTA) 30 MG capsule Take 1 tablet by mouth daily.    . furosemide (LASIX) 40 MG tablet Take 40 mg by mouth daily.    Marland Kitchen HYDROcodone-acetaminophen (NORCO/VICODIN) 5-325 MG per tablet Take 1 tablet by mouth every 6 (six) hours as needed for moderate pain.     30 tablet 0  . Multiple Vitamin (MULTIVITAMIN) tablet Take 1 tablet by mouth daily.    . NON FORMULARY 1 each by  Other route See admin instructions. Use CPAP machine nightly.    Marland Kitchen omeprazole (PRILOSEC) 20 MG capsule Take 20 mg by mouth 2 (two) times daily.    . traZODone (DESYREL) 100 MG tablet Take 1 tablet by mouth every evening.    . zolpidem (AMBIEN) 5 MG tablet Take 5 mg by mouth at bedtime as needed for sleep.     No current facility-administered medications on file prior to visit.     Allergies  Allergen Reactions  . Shellfish Allergy Nausea And Vomiting    Blood pressure 102/70, pulse 96, weight (!) 334 lb 12.8 oz (151.9 kg), SpO2 94 %.  NICM (nonischemic cardiomyopathy) ([place]) Patient tolerating Entresto and beta-blocker therapy well. Blood presusre today of 102/70 limits further titration of Entresto. Renal function remains stable as well. Will continue all medication as previously prescribed.  Patient currently following up with PCP and pulmonologist for persistent cough. Prescription sent to prefer pharmacy and 2 weeks sample provided as well.  Liliana Brentlinger Rodriguez-Guzman PharmD, BCPS, [place] [address] [place] [address] 09/16/2017 8:45 AM

## 2017-09-16 ENCOUNTER — Encounter: Payer: Self-pay | Admitting: Pharmacist

## 2017-09-16 ENCOUNTER — Ambulatory Visit (INDEPENDENT_AMBULATORY_CARE_PROVIDER_SITE_OTHER): Payer: Medicare Other | Admitting: Pharmacist

## 2017-09-16 VITALS — BP 102/70 | HR 96 | Wt 334.8 lb

## 2017-09-16 DIAGNOSIS — I428 Other cardiomyopathies: Secondary | ICD-10-CM

## 2017-09-16 MED ORDER — SACUBITRIL-VALSARTAN 49-51 MG PO TABS: 1.0000 | ORAL_TABLET | Freq: Two times a day (BID) | ORAL | 5 refills | Status: DC

## 2017-09-16 NOTE — Assessment & Plan Note (Addendum)
Patient tolerating Entresto and beta-blocker therapy well. Blood presusre today of 102/70 limits further titration of Entresto. Renal function remains stable as well. Will continue all medication as previously prescribed. Patient currently following up with PCP and pulmonologist for persistent cough. Prescription sent to prefer pharmacy and 2 weeks sample provided as well.

## 2017-09-16 NOTE — Patient Instructions (Addendum)
Return for a  follow up appointment as needed  Check your blood pressure at home daily (if able) and keep record of the readings.  Take your BP meds as follows:  *CONTINUE ALL MEDICATION AS PREVIOUSLY PRESCRIBED*  Bring all of your meds, your BP cuff and your record of home blood pressures to your next appointment.  Exercise as you're able, try to walk approximately 30 minutes per day.  Keep salt intake to a minimum, especially watch canned and prepared boxed foods.  Eat more fresh fruits and vegetables and fewer canned items.  Avoid eating in fast food restaurants.    HOW TO TAKE YOUR BLOOD PRESSURE: . Rest 5 minutes before taking your blood pressure. .  Don't smoke or drink caffeinated beverages for at least 30 minutes before. . Take your blood pressure before (not after) you eat. . Sit comfortably with your back supported and both feet on the floor (don't cross your legs). . Elevate your arm to heart level on a table or a desk. . Use the proper sized cuff. It should fit smoothly and snugly around your bare upper arm. There should be enough room to slip a fingertip under the cuff. The bottom edge of the cuff should be 1 inch above the crease of the elbow. . Ideally, take 3 measurements at one sitting and record the average.

## 2017-09-21 DIAGNOSIS — F331 Major depressive disorder, recurrent, moderate: Secondary | ICD-10-CM | POA: Diagnosis not present

## 2017-10-11 ENCOUNTER — Telehealth: Payer: Self-pay | Admitting: *Deleted

## 2017-10-11 NOTE — Telephone Encounter (Signed)
Spoke with pt, aware patient assistance paperwork has been faxed to [employer] for entresto.

## 2017-10-26 ENCOUNTER — Other Ambulatory Visit: Payer: Self-pay

## 2017-10-26 ENCOUNTER — Ambulatory Visit (HOSPITAL_COMMUNITY): Payer: Medicare Other | Attending: Cardiology

## 2017-10-26 DIAGNOSIS — G4730 Sleep apnea, unspecified: Secondary | ICD-10-CM | POA: Insufficient documentation

## 2017-10-26 DIAGNOSIS — I119 Hypertensive heart disease without heart failure: Secondary | ICD-10-CM | POA: Diagnosis not present

## 2017-10-26 DIAGNOSIS — I428 Other cardiomyopathies: Secondary | ICD-10-CM

## 2017-10-26 DIAGNOSIS — E785 Hyperlipidemia, unspecified: Secondary | ICD-10-CM | POA: Insufficient documentation

## 2017-10-27 ENCOUNTER — Telehealth: Payer: Self-pay | Admitting: Cardiology

## 2017-10-27 NOTE — Telephone Encounter (Signed)
New message  Pt's wife calling to check on the results of pt's echo. Please call

## 2017-10-27 NOTE — Telephone Encounter (Signed)
Returned call to patient echo results given. 

## 2017-11-02 ENCOUNTER — Ambulatory Visit: Payer: Medicare Other | Admitting: Cardiology

## 2017-11-02 DIAGNOSIS — F331 Major depressive disorder, recurrent, moderate: Secondary | ICD-10-CM | POA: Diagnosis not present

## 2017-11-08 DIAGNOSIS — F331 Major depressive disorder, recurrent, moderate: Secondary | ICD-10-CM | POA: Diagnosis not present

## 2017-11-12 DIAGNOSIS — I10 Essential (primary) hypertension: Secondary | ICD-10-CM | POA: Diagnosis not present

## 2017-11-12 DIAGNOSIS — N182 Chronic kidney disease, stage 2 (mild): Secondary | ICD-10-CM | POA: Diagnosis not present

## 2017-11-12 DIAGNOSIS — G4733 Obstructive sleep apnea (adult) (pediatric): Secondary | ICD-10-CM | POA: Diagnosis not present

## 2017-11-12 DIAGNOSIS — K30 Functional dyspepsia: Secondary | ICD-10-CM | POA: Diagnosis not present

## 2017-11-12 DIAGNOSIS — E119 Type 2 diabetes mellitus without complications: Secondary | ICD-10-CM | POA: Diagnosis not present

## 2017-11-12 DIAGNOSIS — Z6841 Body Mass Index (BMI) 40.0 and over, adult: Secondary | ICD-10-CM | POA: Diagnosis not present

## 2017-11-12 DIAGNOSIS — F325 Major depressive disorder, single episode, in full remission: Secondary | ICD-10-CM | POA: Diagnosis not present

## 2017-11-12 DIAGNOSIS — M25569 Pain in unspecified knee: Secondary | ICD-10-CM | POA: Diagnosis not present

## 2017-11-12 DIAGNOSIS — I5042 Chronic combined systolic (congestive) and diastolic (congestive) heart failure: Secondary | ICD-10-CM | POA: Diagnosis not present

## 2017-11-12 DIAGNOSIS — R05 Cough: Secondary | ICD-10-CM | POA: Diagnosis not present

## 2017-12-10 DIAGNOSIS — M170 Bilateral primary osteoarthritis of knee: Secondary | ICD-10-CM | POA: Diagnosis not present

## 2017-12-20 DIAGNOSIS — F331 Major depressive disorder, recurrent, moderate: Secondary | ICD-10-CM | POA: Diagnosis not present

## 2018-01-24 ENCOUNTER — Telehealth: Payer: Self-pay | Admitting: Cardiology

## 2018-01-24 MED ORDER — SACUBITRIL-VALSARTAN 49-51 MG PO TABS: 1.0000 | ORAL_TABLET | Freq: Two times a day (BID) | ORAL | 5 refills | Status: DC

## 2018-01-24 NOTE — Telephone Encounter (Signed)
Entresto 49 mgLot #[id number] Exp 1/21  1 box with 30 day free card at front desk for pick up.  Did ask about the patient assistance program and they received a letter stating patient had to apply for Medicare Part D and be denied first. That is pending.

## 2018-01-24 NOTE — Telephone Encounter (Signed)
New message   Patient calling the office for samples of medication:   1.  What medication and dosage are you requesting samples for?sacubitril-valsartan (ENTRESTO) 49-51 MG 2.  Are you currently out of this medication? yes

## 2018-02-22 ENCOUNTER — Encounter: Payer: Self-pay | Admitting: Cardiology

## 2018-02-22 ENCOUNTER — Ambulatory Visit (INDEPENDENT_AMBULATORY_CARE_PROVIDER_SITE_OTHER): Payer: Medicare Other | Admitting: Cardiology

## 2018-02-22 VITALS — BP 136/82 | HR 83 | Ht 70.0 in | Wt 344.0 lb

## 2018-02-22 DIAGNOSIS — I10 Essential (primary) hypertension: Secondary | ICD-10-CM

## 2018-02-22 DIAGNOSIS — I5042 Chronic combined systolic (congestive) and diastolic (congestive) heart failure: Secondary | ICD-10-CM

## 2018-02-22 DIAGNOSIS — I428 Other cardiomyopathies: Secondary | ICD-10-CM | POA: Diagnosis not present

## 2018-02-22 DIAGNOSIS — E7800 Pure hypercholesterolemia, unspecified: Secondary | ICD-10-CM

## 2018-02-22 LAB — BASIC METABOLIC PANEL
BUN / CREAT RATIO: 11 (ref 9–20)
BUN: 11 mg/dL (ref 6–24)
CO2: 19 mmol/L — AB (ref 20–29)
CREATININE: 0.97 mg/dL (ref 0.76–1.27)
Calcium: 9.1 mg/dL (ref 8.7–10.2)
Chloride: 100 mmol/L (ref 96–106)
GFR calc Af Amer: 102 mL/min/{1.73_m2} (ref >59)
GFR, EST NON AFRICAN AMERICAN: 88 mL/min/{1.73_m2} (ref >59)
Glucose: 220 mg/dL — ABNORMAL HIGH (ref 65–99)
Potassium: 4.5 mmol/L (ref 3.5–5.2)
SODIUM: 133 mmol/L — AB (ref 134–144)

## 2018-02-22 NOTE — Progress Notes (Signed)
HPI: FU cardiomyopathy and hypertension.Echocardiogram 11/18 showed EF 40-98%, grade 1 diastolic dysfunction, mild left atrial enlargement and mild right ventricular enlargement. RV function mildly reduced. Nuclear study November 2018 showed ejection fraction 31%, diaphragmatic attenuation and mild inferior ischemia could not be excluded.   Cardiac catheterization November 2018 showed normal coronary arteries and elevated left ventricular end-diastolic pressure of 26 mmHg.  Cardiac medications titrated.  Repeat echocardiogram February 2019 showed improvement in LV function with ejection fraction 50 to 55% and mild left atrial enlargement.  Since last seen, the patient denies any dyspnea on exertion, orthopnea, PND, pedal edema, palpitations, syncope or chest pain.    Current Outpatient Medications  Medication Sig Dispense Refill  . acetaminophen (TYLENOL) 325 MG tablet Take 2 tablets (650 mg total) by mouth every 6 (six) hours as needed for mild pain (or Fever >/= 101).    Marland Kitchen albuterol (PROVENTIL HFA;VENTOLIN HFA) 108 (90 BASE) MCG/ACT inhaler Inhale 2 puffs into the lungs every 4 (four) hours as needed for wheezing or shortness of breath.    . ALPRAZolam (XANAX) 0.5 MG tablet Take 1 tablet (0.5 mg total) by mouth 2 (two) times daily as needed for anxiety. 20 tablet 0  . aspirin 81 MG chewable tablet Chew 1 tablet (81 mg total) by mouth daily.    Marland Kitchen atorvastatin (LIPITOR) 10 MG tablet Take 10 mg by mouth daily.    . carvedilol (COREG) 6.25 MG tablet Take 1 tablet (6.25 mg total) by mouth 2 (two) times daily. 180 tablet 3  . DULoxetine (CYMBALTA) 30 MG capsule Take 1 tablet by mouth daily.    . furosemide (LASIX) 40 MG tablet Take 40 mg by mouth daily.    Marland Kitchen HYDROcodone-acetaminophen (NORCO/VICODIN) 5-325 MG per tablet Take 1 tablet by mouth every 6 (six) hours as needed for moderate pain.     30 tablet 0  . Multiple Vitamin (MULTIVITAMIN) tablet Take 1 tablet by mouth daily.    . NON  FORMULARY 1 each by Other route See admin instructions. Use CPAP machine nightly.    Marland Kitchen omeprazole (PRILOSEC) 20 MG capsule Take 20 mg by mouth 2 (two) times daily.    . sacubitril-valsartan (ENTRESTO) 49-51 MG Take 1 tablet by mouth 2 (two) times daily. 60 tablet 5  . traZODone (DESYREL) 100 MG tablet Take 1 tablet by mouth every evening.     No current facility-administered medications for this visit.      Past Medical History:  Diagnosis Date  . Abscess of anal and rectal regions   . Acute renal insufficiency   . Asthma   . Chest pain   . Diabetes mellitus    "pre diabetic"- no meds  . DOE (dyspnea on exertion)   . Hemorrhoids, internal   . Hyperlipidemia   . Hypertension   . Sleep apnea     Past Surgical History:  Procedure Laterality Date  . COLONOSCOPY    . INCISION AND DRAINAGE PERIRECTAL ABSCESS  06/09/11  . LEFT HEART CATH AND CORONARY ANGIOGRAPHY N/A 08/06/2017   Procedure: LEFT HEART CATH AND CORONARY ANGIOGRAPHY;  Surgeon: Burnell Blanks, MD;  Location: [facility];  Service: Cardiovascular;  Laterality: N/A;  . POLYPECTOMY    . TONSILLECTOMY    . ULNAR COLLATERAL LIGAMENT RECONSTRUCTION  2003   left hand    Social History   Socioeconomic History  . Marital status: Married    Spouse name: Not on file  . Number of children: 3  .  Years of education: Not on file  . Highest education level: Not on file  Occupational History  . Not on file  Social Needs  . Financial resource strain: Not on file  . Food insecurity:    Worry: Not on file    Inability: Not on file  . Transportation needs:    Medical: Not on file    Non-medical: Not on file  Tobacco Use  . Smoking status: Former Smoker    Types: Cigarettes    Last attempt to quit: 12/15/1995    Years since quitting: 22.2  . Smokeless tobacco: Never Used  Substance and Sexual Activity  . Alcohol use: Yes    Alcohol/week: 1.2 oz    Types: 2 Standard drinks or equivalent per week    Comment:  Occasional      . Drug use: No  . Sexual activity: Not on file  Lifestyle  . Physical activity:    Days per week: Not on file    Minutes per session: Not on file  . Stress: Not on file  Relationships  . Social connections:    Talks on phone: Not on file    Gets together: Not on file    Attends religious service: Not on file    Active member of club or organization: Not on file    Attends meetings of clubs or organizations: Not on file    Relationship status: Not on file  . Intimate partner violence:    Fear of current or ex partner: Not on file    Emotionally abused: Not on file    Physically abused: Not on file    Forced sexual activity: Not on file  Other Topics Concern  . Not on file  Social History Narrative  . Not on file    Family History  Problem Relation Age of Onset  . Diabetes Father   . Heart disease Father   . Diabetes Mother   . Heart disease Paternal Grandmother   . Colon cancer Neg Hx   . Esophageal cancer Neg Hx   . Rectal cancer Neg Hx   . Stomach cancer Neg Hx     ROS: no fevers or chills, productive cough, hemoptysis, dysphasia, odynophagia, melena, hematochezia, dysuria, hematuria, rash, seizure activity, orthopnea, PND, pedal edema, claudication. Remaining systems are negative.  Physical Exam: Well-developed obese in no acute distress.  Skin is warm and dry.  HEENT is normal.  Neck is supple.  Chest is clear to auscultation with normal expansion.  Cardiovascular exam is regular rate and rhythm.  Abdominal exam nontender or distended. No masses palpated. Extremities show no edema. neuro grossly intact  A/P  1 nonischemic cardiomyopathy-cardiac catheterization showed no coronary disease.  Continue medical therapy including Entresto and carvedilol.  LV function has improved.  Hypertension may have contributed to cardiomyopathy.  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  Continue  present dose of diuretic.  We discussed the importance of low-sodium diet and fluid restriction.  Check potassium and renal function.  3 hypertension-blood pressure is borderline.  However it is typically controlled at home running 120/70 by his report.  He also has some fatigue on his medications.  I will therefore not increase carvedilol at this point but we can consider in the future.  4 hyperlipidemia-continue statin.  5 morbid obesity-we discussed the importance of weight loss.  Kirk Ruths, MD

## 2018-02-22 NOTE — Patient Instructions (Signed)

## 2018-02-24 ENCOUNTER — Encounter: Payer: Self-pay | Admitting: *Deleted

## 2018-03-24 DIAGNOSIS — E119 Type 2 diabetes mellitus without complications: Secondary | ICD-10-CM | POA: Diagnosis not present

## 2018-03-24 DIAGNOSIS — Z125 Encounter for screening for malignant neoplasm of prostate: Secondary | ICD-10-CM | POA: Diagnosis not present

## 2018-03-24 DIAGNOSIS — R82998 Other abnormal findings in urine: Secondary | ICD-10-CM | POA: Diagnosis not present

## 2018-03-24 DIAGNOSIS — I10 Essential (primary) hypertension: Secondary | ICD-10-CM | POA: Diagnosis not present

## 2018-03-31 DIAGNOSIS — G4733 Obstructive sleep apnea (adult) (pediatric): Secondary | ICD-10-CM | POA: Diagnosis not present

## 2018-03-31 DIAGNOSIS — Z23 Encounter for immunization: Secondary | ICD-10-CM | POA: Diagnosis not present

## 2018-03-31 DIAGNOSIS — E786 Lipoprotein deficiency: Secondary | ICD-10-CM | POA: Diagnosis not present

## 2018-03-31 DIAGNOSIS — I5042 Chronic combined systolic (congestive) and diastolic (congestive) heart failure: Secondary | ICD-10-CM | POA: Diagnosis not present

## 2018-03-31 DIAGNOSIS — I130 Hypertensive heart and chronic kidney disease with heart failure and stage 1 through stage 4 chronic kidney disease, or unspecified chronic kidney disease: Secondary | ICD-10-CM | POA: Diagnosis not present

## 2018-03-31 DIAGNOSIS — N182 Chronic kidney disease, stage 2 (mild): Secondary | ICD-10-CM | POA: Diagnosis not present

## 2018-03-31 DIAGNOSIS — Z0000 Encounter for general adult medical examination without abnormal findings: Secondary | ICD-10-CM | POA: Diagnosis not present

## 2018-03-31 DIAGNOSIS — E119 Type 2 diabetes mellitus without complications: Secondary | ICD-10-CM | POA: Diagnosis not present

## 2018-03-31 DIAGNOSIS — Z6841 Body Mass Index (BMI) 40.0 and over, adult: Secondary | ICD-10-CM | POA: Diagnosis not present

## 2018-03-31 DIAGNOSIS — I5189 Other ill-defined heart diseases: Secondary | ICD-10-CM | POA: Diagnosis not present

## 2018-03-31 DIAGNOSIS — F325 Major depressive disorder, single episode, in full remission: Secondary | ICD-10-CM | POA: Diagnosis not present

## 2018-03-31 DIAGNOSIS — R05 Cough: Secondary | ICD-10-CM | POA: Diagnosis not present

## 2018-04-11 ENCOUNTER — Encounter: Payer: Self-pay | Admitting: Neurology

## 2018-04-11 DIAGNOSIS — E119 Type 2 diabetes mellitus without complications: Secondary | ICD-10-CM | POA: Diagnosis not present

## 2018-04-11 DIAGNOSIS — Z1212 Encounter for screening for malignant neoplasm of rectum: Secondary | ICD-10-CM | POA: Diagnosis not present

## 2018-04-12 ENCOUNTER — Ambulatory Visit (INDEPENDENT_AMBULATORY_CARE_PROVIDER_SITE_OTHER): Payer: Medicare Other | Admitting: Neurology

## 2018-04-12 ENCOUNTER — Encounter: Payer: Self-pay | Admitting: Neurology

## 2018-04-12 VITALS — BP 126/75 | HR 82 | Ht 70.0 in | Wt 345.0 lb

## 2018-04-12 DIAGNOSIS — Z9989 Dependence on other enabling machines and devices: Secondary | ICD-10-CM

## 2018-04-12 DIAGNOSIS — I428 Other cardiomyopathies: Secondary | ICD-10-CM

## 2018-04-12 DIAGNOSIS — Z6841 Body Mass Index (BMI) 40.0 and over, adult: Secondary | ICD-10-CM | POA: Diagnosis not present

## 2018-04-12 DIAGNOSIS — G4733 Obstructive sleep apnea (adult) (pediatric): Secondary | ICD-10-CM

## 2018-04-12 NOTE — Progress Notes (Signed)
Subjective:    Patient ID: [name] is a 57 y.o. male.  HPI     Star Age, MD, PhD [facility] [address] [place], [place] [address]  Dear Dr. Virgina Jock,   I saw your patient, [name], upon your kind request in my neurologic clinic today for initial consultation of his sleep disorder, in particular, re-evaluation of his prior diagnosis of obstructive sleep apnea. The patient is accompanied by his wife today. As you know, [name] is a 57 year old right-handed gentleman with an underlying medical history of hypertension, type 2 diabetes, anemia, reflux disease, b/l knee arthritis, childhood asthma, dilated cardiomyopathy, and morbid obesity with BMI of over 10, who reports a prior diagnosis of obstructive sleep apnea and he has been on CPAP therapy for years. He is on his second CPAP machine. Sleep study testing was about 16 years ago and prior study results are not available for my review today. I was able to review his compliance data from his AutoPap machine from 03/13/2018 through 04/11/2018, which is a total of 30 days, during which time he used his AutoPap every night with percent used days greater than 4 hours at 100%, indicating superb compliance with an average usage of 9 hours and 25 minutes, residual AHI at goal at 0.4 per hour, 90th percentile pressure at 11.3 cm with range of 10 cm to 20 cm, leak on the low side. His DME company has been [employer]. He is married and lives with his family which includes his wife; they have 3 grown daughters. He is on disability. He has gained weight over time. When he was first diagnosed with sleep apnea he was told it was severe. He had improvement on CPAP. He is fully compliant with treatment and states that he really cannot sleep without it. He suspects that his father had sleep apnea. Father died at age 45 from complications of his heart disease, diabetes and kidney disease. His Epworth  sleepiness score is 4 out of 24, fatigue score is 42 out of 63. I reviewed your office note from 03/31/2018, which you kindly included. He had a tonsillectomy as a child. He has had difficulty going to sleep and staying asleep. He is on trazodone 100 mg strength typically takes 50 mg each night, can take up to 150 mg. He quit smoking in 1997, drinks alcohol occasionally, maybe on weekends, caffeine and limitation, not daily, typically in the form of soda.  He has had a surplus of supplies for years. He was notified recently by his DME company that he would need a new prescription for supplies. He would like to avoid a sleep study if at all possible as he worries about not being able to sleep without his machine.  His Past Medical History Is Significant For: Past Medical History:  Diagnosis Date  . Abscess of anal and rectal regions   . Acute renal insufficiency   . Asthma   . Chest pain   . Diabetes mellitus    "pre diabetic"- no meds  . DOE (dyspnea on exertion)   . Hemorrhoids, internal   . Hyperlipidemia   . Hypertension   . Sleep apnea     His Past Surgical History Is Significant For: Past Surgical History:  Procedure Laterality Date  . COLONOSCOPY    . INCISION AND DRAINAGE PERIRECTAL ABSCESS  06/09/11  . LEFT HEART CATH AND CORONARY ANGIOGRAPHY N/A 08/06/2017   Procedure: LEFT HEART CATH AND CORONARY ANGIOGRAPHY;  Surgeon: Burnell Blanks, MD;  Location: [facility];  Service: Cardiovascular;  Laterality: N/A;  . POLYPECTOMY    . TONSILLECTOMY    . ULNAR COLLATERAL LIGAMENT RECONSTRUCTION  2003   left hand    His Family History Is Significant For: Family History  Problem Relation Age of Onset  . Diabetes Father   . Heart disease Father   . Diabetes Mother   . Heart disease Paternal Grandmother   . Colon cancer Neg Hx   . Esophageal cancer Neg Hx   . Rectal cancer Neg Hx   . Stomach cancer Neg Hx     His Social History Is Significant For: Social  History   Socioeconomic History  . Marital status: Married    Spouse name: Not on file  . Number of children: 3  . Years of education: Not on file  . Highest education level: Not on file  Occupational History  . Not on file  Social Needs  . Financial resource strain: Not on file  . Food insecurity:    Worry: Not on file    Inability: Not on file  . Transportation needs:    Medical: Not on file    Non-medical: Not on file  Tobacco Use  . Smoking status: Former Smoker    Types: Cigarettes    Last attempt to quit: 12/15/1995    Years since quitting: 22.3  . Smokeless tobacco: Never Used  Substance and Sexual Activity  . Alcohol use: Yes    Alcohol/week: 1.2 oz    Types: 2 Standard drinks or equivalent per week    Comment: Occasional      . Drug use: No  . Sexual activity: Not on file  Lifestyle  . Physical activity:    Days per week: Not on file    Minutes per session: Not on file  . Stress: Not on file  Relationships  . Social connections:    Talks on phone: Not on file    Gets together: Not on file    Attends religious service: Not on file    Active member of club or organization: Not on file    Attends meetings of clubs or organizations: Not on file    Relationship status: Not on file  Other Topics Concern  . Not on file  Social History Narrative  . Not on file    His Allergies Are:  Allergies  Allergen Reactions  . Shellfish Allergy Nausea And Vomiting  :   His Current Medications Are:  Outpatient Encounter Medications as of 04/12/2018  Medication Sig  . acetaminophen (TYLENOL) 325 MG tablet Take 2 tablets (650 mg total) by mouth every 6 (six) hours as needed for mild pain (or Fever >/= 101).  Marland Kitchen albuterol (PROVENTIL HFA;VENTOLIN HFA) 108 (90 BASE) MCG/ACT inhaler Inhale 2 puffs into the lungs every 4 (four) hours as needed for wheezing or shortness of breath.  . ALPRAZolam (XANAX) 0.5 MG tablet Take 1 tablet (0.5 mg total) by mouth 2 (two) times daily as  needed for anxiety.  Marland Kitchen aspirin 81 MG chewable tablet Chew 1 tablet (81 mg total) by mouth daily.  Marland Kitchen atorvastatin (LIPITOR) 10 MG tablet Take 10 mg by mouth daily.  . carvedilol (COREG) 6.25 MG tablet Take 1 tablet (6.25 mg total) by mouth 2 (two) times daily.  . DULoxetine (CYMBALTA) 30 MG capsule Take 1 tablet by mouth daily.  . furosemide (LASIX) 40 MG tablet Take 40 mg by mouth daily.  Marland Kitchen  HYDROcodone-acetaminophen (NORCO/VICODIN) 5-325 MG per tablet Take 1 tablet by mouth every 6 (six) hours as needed for moderate pain.      . Multiple Vitamin (MULTIVITAMIN) tablet Take 1 tablet by mouth daily.  . NON FORMULARY 1 each by Other route See admin instructions. Use CPAP machine nightly.  Marland Kitchen omeprazole (PRILOSEC) 20 MG capsule Take 20 mg by mouth 2 (two) times daily.  . sacubitril-valsartan (ENTRESTO) 49-51 MG Take 1 tablet by mouth 2 (two) times daily.  . traZODone (DESYREL) 100 MG tablet Take 1 tablet by mouth every evening.   No facility-administered encounter medications on file as of 04/12/2018.   :  Review of Systems:  Out of a complete 14 point review of systems, all are reviewed and negative with the exception of these symptoms as listed below:      Review of Systems  Neurological:       Patient reports that he was diagnosed with sleep apnea about 16 years ago and has been using his auto pap since.   Epworth Sleepiness Scale 0= would never doze 1= slight chance of dozing 2= moderate chance of dozing 3= high chance of dozing  Sitting and reading:1 Watching TV:1 Sitting inactive in a public place (ex. Theater or meeting):0 As a passenger in a car for an hour without a break:1 Lying down to rest in the afternoon:1 Sitting and talking to someone:0 Sitting quietly after lunch (no alcohol):0 In a car, while stopped in traffic:0 Total:4     Objective:  Neurological Exam  Physical Exam Physical Examination:   Vitals:   04/12/18 0845  BP: 126/75  Pulse: 82     General Examination: The patient is a very pleasant 57 y.o. male in no acute distress. He appears well-developed and well-nourished and well groomed.   HEENT: Normocephalic, atraumatic, pupils are equal, round and reactive to light and accommodation. Extraocular tracking is good without limitation to gaze excursion or nystagmus noted. Normal smooth pursuit is noted. Hearing is grossly intact. Face is symmetric with normal facial animation and normal facial sensation. Speech is clear with no dysarthria noted. There is no hypophonia. There is no lip, neck/head, jaw or voice tremor. Neck is supple with full range of passive and active motion. There are no carotid bruits on auscultation. Oropharynx exam reveals: mild mouth dryness, adequate dental hygiene and marked airway crowding, due to smaller airway entry, wider tongue, redundant soft palate. Tonsils are absent. Mallampati is class III. Neck circumference is 22 and three-quarter inches. Tongue protrudes centrally and palate elevates symmetrically.   Chest: Clear to auscultation without wheezing, rhonchi or crackles noted.  Heart: S1+S2+0, regular and normal without murmurs, rubs or gallops noted.   Abdomen: Soft, non-tender and non-distended with normal bowel sounds appreciated on auscultation.  Extremities: There is 1+ pitting edema in the distal lower extremities bilaterally.   Skin: Warm and dry without trophic changes noted.  Musculoskeletal: exam reveals b/l knee braces, b/l knee pain.   Neurologically:  Mental status: The patient is awake, alert and oriented in all 4 spheres. His immediate and remote memory, attention, language skills and fund of knowledge are appropriate. There is no evidence of aphasia, agnosia, apraxia or anomia. Speech is clear with normal prosody and enunciation. Thought process is linear. Mood is normal and affect is normal.  Cranial nerves II - XII are as described above under HEENT exam. In addition: shoulder  shrug is normal with equal shoulder height noted. Motor exam: Normal bulk, strength and tone is  noted. There is no drift, tremor or rebound. Romberg is not testable safely. fine motor skills and coordination: intact with normal finger taps, normal hand movements, normal rapid alternating patting, normal foot taps and normal foot agility.  Cerebellar testing: No dysmetria or intention tremor, heel to shin not possible.  Sensory exam: intact to light touch in the upper and lower extremities.  Gait, station and balance: He stands with difficulty and has to push himself up, stands wide-based. He walks slowly and with difficulty, single-point cane in the right hand, limp noted on the right.   Assessment and Plan:  In summary, LADISLAV CASELLI is a very pleasant 57 y.o.-year old male  with an underlying complex medical history of hypertension, type 2 diabetes, anemia, reflux disease, b/l knee arthritis, childhood asthma, dilated cardiomyopathy, and morbid obesity with BMI of over 26, who  was previously diagnosed several years ago with severe sleep apnea and placed on CPAP therapy. He has been fully compliant, he currently has a AutoPap machine and his sleep apnea is well treated with the residual AHI of less than 1 per hour in the past month. He uses a large nasal mask from [employer] successfully and reports full compliance and inability to sleep without his machine. He does need new supplies and would also be eligible for new machine as this machine is about 55 years old per his estimate. He would like to avoid coming in for sleep study and since he is fully compliant with his AutoPap and well treated with and I would suggest that we try to get him a updated machine and new supplies through his current DME company if possible. However, he is advised that his insurance may mandate that he have a repeat sleep study, in which case I would be happy to order study. He is agreeable with this plan. I would like for him to  return for routine follow-up for a compliance recheck with one of our nurse practitioners in about 3 months. I answered all their questions today and the patient and his wife were in agreement.   Thank you very much for allowing me to participate in the care of this nice patient. If I can be of any further assistance to you please do not hesitate to call me at [phone].  Sincerely,   Star Age, MD, PhD

## 2018-04-12 NOTE — Patient Instructions (Addendum)
We will request new supplies and hopefully a new autoPAP machine.  Please continue using your autoPAP regularly. While your insurance requires that you use PAP at least 4 hours each night on 70% of the nights, I recommend, that you not skip any nights and use it throughout the night if you can. Getting used to PAP and staying with the treatment long term does take time and patience and discipline. Untreated obstructive sleep apnea when it is moderate to severe can have an adverse impact on cardiovascular health and raise her risk for heart disease, arrhythmias, hypertension, congestive heart failure, stroke and diabetes. Untreated obstructive sleep apnea causes sleep disruption, nonrestorative sleep, and sleep deprivation. This can have an impact on your day to day functioning and cause daytime sleepiness and impairment of cognitive function, memory loss, mood disturbance, and problems focussing. Using PAP regularly can improve these symptoms.  You have been fully compliant with your autoPAP. Keep up the good work! We will see you in 3 months for routine follow up. Keep in mind, that your insurance carrier may mandate, that you have a new sleep study in order to get new supplies and/or a new machine.

## 2018-04-14 DIAGNOSIS — F331 Major depressive disorder, recurrent, moderate: Secondary | ICD-10-CM | POA: Diagnosis not present

## 2018-04-20 ENCOUNTER — Telehealth: Payer: Self-pay

## 2018-04-20 DIAGNOSIS — Z6841 Body Mass Index (BMI) 40.0 and over, adult: Secondary | ICD-10-CM

## 2018-04-20 DIAGNOSIS — G4733 Obstructive sleep apnea (adult) (pediatric): Secondary | ICD-10-CM

## 2018-04-20 DIAGNOSIS — Z9989 Dependence on other enabling machines and devices: Principal | ICD-10-CM

## 2018-04-20 NOTE — Telephone Encounter (Signed)
I received notice from [facility] that they need a baseline sleep study signed and dated by a physician credentialed in sleep mediicne before the cn bill for pt's PAP supplies, which we do not have on file.  I called pt, spoke to pt's wife, Ebony Hail, per DPR, and explained this to her. She reports that they received the same notification and are agreeable to pt getting another sleep study. I advised her that our office will call her to schedule the sleep study. She asked that the study be scheduled after 05/21/18.  Received a VO from Dr. Rexene Alberts to order pt's split night study. Order placed.

## 2018-05-21 ENCOUNTER — Encounter

## 2018-05-27 ENCOUNTER — Ambulatory Visit: Payer: Medicare Other | Admitting: Neurology

## 2018-05-30 ENCOUNTER — Institutional Professional Consult (permissible substitution): Payer: Self-pay | Admitting: Neurology

## 2018-06-10 ENCOUNTER — Ambulatory Visit (INDEPENDENT_AMBULATORY_CARE_PROVIDER_SITE_OTHER): Payer: Medicare Other | Admitting: Neurology

## 2018-06-10 DIAGNOSIS — G4720 Circadian rhythm sleep disorder, unspecified type: Secondary | ICD-10-CM

## 2018-06-10 DIAGNOSIS — G4733 Obstructive sleep apnea (adult) (pediatric): Secondary | ICD-10-CM | POA: Diagnosis not present

## 2018-06-14 ENCOUNTER — Telehealth: Payer: Self-pay

## 2018-06-14 DIAGNOSIS — G4733 Obstructive sleep apnea (adult) (pediatric): Secondary | ICD-10-CM

## 2018-06-14 NOTE — Procedures (Signed)
PATIENT'S NAME:  Michael, [name] DOB:      02/17/1961      MR#:    [id number]     DATE OF RECORDING: 06/10/2018 REFERRING M.D.:  Shon Baton MD Study Performed:   Baseline Polysomnogram HISTORY: 57 year old man with a history of hypertension, type 2 diabetes, anemia, reflux disease, b/l knee arthritis, childhood asthma, dilated cardiomyopathy, and morbid obesity with BMI of over 47, who presents for re-evaluation of his obstructive sleep apnea for which he has been on autoPAP. He needs new equipment. Sleep study testing was over 15 years ago. The patient endorsed the [place] Sleepiness Scale at 4 points.  The patient's weight 345 pounds with a height of 70 (inches), resulting in a BMI of 49.2 kg/m2.   PROCEDURE:  This is a multichannel digital polysomnogram utilizing the Somnostar 11.2 system.  Electrodes and sensors were applied and monitored per AASM Specifications.   EEG, EOG, Chin and Limb EMG, were sampled at 200 Hz.  ECG, Snore and Nasal Pressure, Thermal Airflow, Respiratory Effort, CPAP Flow and Pressure, Oximetry was sampled at 50 Hz. Digital video and audio were recorded.      BASELINE STUDY  Lights Out was at 22:01 and Lights On at 05:00.  Total recording time (TRT) was 419 minutes, with a total sleep time (TST) of 186 minutes.  The patient's sleep latency was 184.5 minutes, which is markedly delayed.  REM latency was absent. The sleep efficiency was 44.4%, which is markedly reduced.     SLEEP ARCHITECTURE: WASO (Wake after sleep onset) was 169.5 minutes. There were 25 minutes in Stage N1, 161 minutes Stage N2, 0 minutes Stage N3 and 0 minutes in Stage REM. The percentage of Stage N1 was 13.4%, which is markedly increased, Stage N2 was 86.6%, which is markedly increased, Stage N3 and Stage R (REM sleep) were absent. The arousals were noted as: 50 were spontaneous, 2 were associated with PLMs, 54 were associated with respiratory events.   RESPIRATORY ANALYSIS:  There were a total of 117  respiratory events:  73 obstructive apneas, 1 central apneas and 1 mixed apneas with a total of 75 apneas and an apnea index (AI) of 24.2 /hour. There were 42 hypopneas with a hypopnea index of 13.5 /hour. The patient also had 0 respiratory event related arousals (RERAs).      The total APNEA/HYPOPNEA INDEX (AHI) was 37.7 /hour and the total RESPIRATORY DISTURBANCE INDEX was 0. 37.7 /hour.  0 events occurred in REM sleep and 86 events in NREM. The REM AHI was n/a, versus a non-REM AHI of 37.7. The patient spent 0 minutes of total sleep time in the supine position and 186 minutes in non-supine.. The supine AHI was n/a versus a non-supine AHI of 37.7.  OXYGEN SATURATION & C02:  The Wake baseline 02 saturation was 96%, with the lowest being 86%. Time spent below 89% saturation equaled 12 minutes.  PERIODIC LIMB MOVEMENTS: The patient had a total of 5 Periodic Limb Movements.  The Periodic Limb Movement (PLM) index was 1.6 and the PLM Arousal index was .6/hour.  Audio and video analysis did not show any abnormal or unusual movements, behaviors, phonations or vocalizations. The patient took 3 bathroom breaks. Mild to moderate snoring was noted. The EKG was in keeping with normal sinus rhythm (NSR).  Post-study, the patient indicated that sleep was worse than usual.   IMPRESSION:  1. Obstructive Sleep Apnea (OSA) 2. Dysfunctions associated with sleep stages or arousal from sleep  RECOMMENDATIONS:  1.  This study demonstrates severe obstructive sleep apnea, with a total AHI of 37.7/hour, and O2 nadir of 86%. The study was limited due to poor sleep consolidation. The absence of supine and REM sleep likely underestimates his AHI and O2 nadir. Treatment with positive airway pressure in the form of CPAP is recommended. This will require a full night titration study to optimize therapy. Other treatment options may include avoidance of supine sleep position along with weight loss, upper airway or jaw surgery in  selected patients or the use of an oral appliance in certain patients. ENT evaluation and/or consultation with a maxillofacial surgeon or dentist may be feasible in some instances.    2. Please note that untreated obstructive sleep apnea may carry additional perioperative morbidity. Patients with significant obstructive sleep apnea should receive perioperative PAP therapy and the surgeons and particularly the anesthesiologist should be informed of the diagnosis and the severity of the sleep disordered breathing. 3. This study shows sleep fragmentation and abnormal sleep stage percentages; these are nonspecific findings and per se do not signify an intrinsic sleep disorder or a cause for the patient's sleep-related symptoms. Causes include (but are not limited to) the first night effect of the sleep study, circadian rhythm disturbances, medication effect or an underlying mood disorder or medical problem.  4. The patient should be cautioned not to drive, work at heights, or operate dangerous or heavy equipment when tired or sleepy. Review and reiteration of good sleep hygiene measures should be pursued with any patient. 5. The patient will be seen in follow-up in the sleep clinic at [facility] for discussion of the test results, symptom and treatment compliance review, further management strategies, etc. The referring provider will be notified of the test results.  I certify that I have reviewed the entire raw data recording prior to the issuance of this report in accordance with the Standards of Accreditation of the American Academy of Sleep Medicine (AASM)    Star Age, MD, PhD Diplomat, American Board of Neurology and Sleep Medicine (Neurology and Sleep Medicine)

## 2018-06-14 NOTE — Telephone Encounter (Signed)
-----   Message from Star Age, MD sent at 06/14/2018  8:19 AM EDT ----- Patient referred by Dr. Virgina Jock, seen by me on 04/12/18, diagnostic PSG on 06/10/18.   Please call and notify the patient that the recent sleep study confirms severe obstructive sleep apnea, with a total AHI of 37.7/hour, and O2 nadir of 86%. The study was limited due to poor sleep consolidation (possibly, in part, due to being used to sleeping with AutoPAP at home for years). The absence of supine and REM sleep likely underestimated his AHI and O2 nadir. I recommend treatment with CPAP. This will require a repeat sleep study for proper titration and mask fitting and correct monitoring of the oxygen saturations. Please explain to patient. I have placed an order in the chart. Thanks.  Star Age, MD, PhD [facility])

## 2018-06-14 NOTE — Addendum Note (Signed)
Addended by: Star Age on: 06/14/2018 08:19 AM   Modules accepted: Orders

## 2018-06-14 NOTE — Progress Notes (Signed)
Patient referred by Dr. Virgina Jock, seen by me on 04/12/18, diagnostic PSG on 06/10/18.   Please call and notify the patient that the recent sleep study confirms severe obstructive sleep apnea, with a total AHI of 37.7/hour, and O2 nadir of 86%. The study was limited due to poor sleep consolidation (possibly, in part, due to being used to sleeping with AutoPAP at home for years). The absence of supine and REM sleep likely underestimated his AHI and O2 nadir. I recommend treatment with CPAP. This will require a repeat sleep study for proper titration and mask fitting and correct monitoring of the oxygen saturations. Please explain to patient. I have placed an order in the chart. Thanks.  Star Age, MD, PhD [facility])

## 2018-06-14 NOTE — Telephone Encounter (Signed)
I called pt and explained his sleep study results. Pt does not want to return for a cpap titration study. He reports that he is already on auto pap and just wants a new auto pap machine. He is "frustrated" because he has already completed three sleep studies. I advised him that I will ask Dr. Rexene Alberts if she will order a new auto pap or if he will need to return for the titration study.

## 2018-06-16 ENCOUNTER — Ambulatory Visit: Payer: Medicare Other | Admitting: Psychiatry

## 2018-06-16 DIAGNOSIS — F3342 Major depressive disorder, recurrent, in full remission: Secondary | ICD-10-CM | POA: Insufficient documentation

## 2018-06-16 DIAGNOSIS — F331 Major depressive disorder, recurrent, moderate: Secondary | ICD-10-CM

## 2018-06-16 NOTE — Telephone Encounter (Signed)
I called pt. Dr. Rexene Alberts recommends that pt start an auto pap. I reviewed PAP compliance expectations with the pt. Pt is agreeable to starting an auto-PAP. I advised pt that an order will be sent to a DME, [employer], and Huey Romans will call the pt within about one week after they file with the pt's insurance. Huey Romans will show the pt how to use the machine, fit for masks, and troubleshoot the auto-PAP if needed. A follow up appt was made for insurance purposes with Dr. Rexene Alberts on 09/08/2018 at 3:00pm. Pt verbalized understanding to arrive 15 minutes early and bring their auto-PAP. A letter with all of this information in it will be mailed to the pt as a reminder. I verified with the pt that the address we have on file is correct. Pt verbalized understanding. Pt had no questions at this time but was encouraged to call back if questions arise.

## 2018-06-16 NOTE — Progress Notes (Signed)
      [facility] Counselor/Therapist Progress Note   Patient ID: [name], MRN: [id number]  Date: 06/16/2018  Timespent: 45 minutes  Treatment Type: Individual  Subjective: Patient in today with some depression, anxiety, and fatigue.  Recent sleep study that was very stressful for him.  Also lives with the stress and uncertainty of significant  re-occurring large painful abscess that flares up from time to time.  Had a flareup this past week so is still having some pain from that as well as the fact it embarrasses him with the abscess being in private area.  Current issues with oldest daughter who is not speaking to him and wife.  Youngest daughter and her toddler are both living with patient and his wife.  This daughter gets along well with patient and wife, as well as helps them out around the house.  Patient sadly talks about his hurt from the oldest daughter and the way she treats him and his wife.  Patient already under stress due to his physical problems.  Goal review noting progress and patient acknowledges his perseverance in trying to move forward despite obstacles.  Enjoys video games, church, singing.  Interventions:Solution Focused, Strength-based and Supportive  Mental Status Exam:   Appearance:   Casual     Behavior:  Appropriate and Sharing  Motor:  Ambulatory with cane  Speech/Language:   Normal Rate  Affect:  Congruent  Mood:  anxious  Thought process:  Coherent  Thought content:    Logical  Perceptual disturbances:    Normal  Orientation:  Full (Time, Place, and Person)  Attention:  Good  Concentration:  good  Memory:  Immediate  Fund of knowledge:   Good  Insight:    Good  Judgment:   Good  Impulse Control:  good    Reported Symptoms: Fatigue, anxiety, depression, family problem with a daughter  Risk Assessment: Danger to Self:  No Self-injurious Behavior: No Danger to Others: No Duty to Warn:no Physical Aggression / Violence:No  Access to  Firearms a concern: No  Gang Involvement:No   Diagnosis:   ICD-10-CM   1. Major depressive disorder, recurrent episode, moderate ([place]) F33.1      Plan: Plan to see patient again in 1 month for continued goal-directed treatment.  Shanon Ace, LCSW

## 2018-06-16 NOTE — Telephone Encounter (Signed)
Since he has been on autoPAP with full compliance I will write for a New AUTOPAP machine, as per his preference. FU in 10 to 12 weeks with me or NP needed. Pls update pt. May have existing DME.

## 2018-06-20 ENCOUNTER — Telehealth: Payer: Self-pay | Admitting: Cardiology

## 2018-06-20 NOTE — Telephone Encounter (Signed)
Spoke with pt wife, they were denied by the entresto assistance program and they have used the 30 day card. They need a 2 weeks supply until they get paid. Samples placed at the front desk for pick up.

## 2018-06-20 NOTE — Telephone Encounter (Signed)
New Message:      Patient calling the office for samples of medication:   1.  What medication and dosage are you requesting samples for?  sacubitril-valsartan (ENTRESTO) 49-51 MG  2.  Are you currently out of this medication?  Yes

## 2018-07-03 ENCOUNTER — Encounter: Payer: Self-pay | Admitting: Emergency Medicine

## 2018-07-14 ENCOUNTER — Ambulatory Visit (INDEPENDENT_AMBULATORY_CARE_PROVIDER_SITE_OTHER): Payer: Medicare Other | Admitting: Physician Assistant

## 2018-07-14 ENCOUNTER — Encounter: Payer: Self-pay | Admitting: Physician Assistant

## 2018-07-14 DIAGNOSIS — F331 Major depressive disorder, recurrent, moderate: Secondary | ICD-10-CM

## 2018-07-14 DIAGNOSIS — G4700 Insomnia, unspecified: Secondary | ICD-10-CM

## 2018-07-14 MED ORDER — TRAZODONE HCL 100 MG PO TABS: 100.0000 mg | ORAL_TABLET | Freq: Every evening | ORAL | 5 refills | Status: DC | PRN

## 2018-07-14 MED ORDER — ALPRAZOLAM 0.5 MG PO TABS: 0.5000 mg | ORAL_TABLET | Freq: Two times a day (BID) | ORAL | 0 refills | Status: DC | PRN

## 2018-07-14 NOTE — Progress Notes (Signed)
[facility] Med Check  Patient ID: [name],  MRN: [id number]  PCP: Shon Baton, MD  Date of Evaluation: 07/14/2018 Time spent:15 minutes  Chief Complaint:  Chief Complaint    Medication Refill; Depression; Follow-up    Seen alone today for 28-month medication checkup.   HISTORY/CURRE STATUS: HPI patient states he is doing well overall.  The medications are still working.  He denies anhedonia, decreased energy or motivation, no isolating.  He and his wife are still dealing with 1 of their daughters who is giving them trouble (he does not specify exactly what it is) but they have set boundaries, and he is seeing the therapist which has been helpful.  Does feel anxious on occasion and has been given a few Xanax per his PCP.  He may need some more.  He uses them very infrequently.  He is not having panic attacks but rather a generalized sense of uneasiness and edginess especially when it comes to situations with his daughter.  He sleeps well long as he has the trazodone.  He has not been having as much pain in his knees.  He is been getting Symvasc injections which has been helpful.  Is no longer taking hydrocodone when he has severe pain.  He only takes Tylenol which is helping slightly.     Individual Medical History/ Review of Systems: Changes? :No    Past medications for mental health diagnoses include: None   Allergies: Shellfish allergy  Current Medications:  Current Outpatient Medications:  .  acetaminophen (TYLENOL) 325 MG tablet, Take 2 tablets (650 mg total) by mouth every 6 (six) hours as needed for mild pain (or Fever >/= 101)., Disp: , Rfl:  .  albuterol (PROVENTIL HFA;VENTOLIN HFA) 108 (90 BASE) MCG/ACT inhaler, Inhale 2 puffs into the lungs every 4 (four) hours as needed for wheezing or shortness of breath., Disp: , Rfl:  .  ALPRAZolam (XANAX) 0.5 MG tablet, Take 1 tablet (0.5 mg total) by mouth 2 (two) times daily as needed for anxiety., Disp: 30 tablet, Rfl:  0 .  aspirin 81 MG chewable tablet, Chew 1 tablet (81 mg total) by mouth daily., Disp: , Rfl:  .  atorvastatin (LIPITOR) 10 MG tablet, Take 10 mg by mouth daily., Disp: , Rfl:  .  carvedilol (COREG) 6.25 MG tablet, Take 1 tablet (6.25 mg total) by mouth 2 (two) times daily., Disp: 180 tablet, Rfl: 3 .  DULoxetine (CYMBALTA) 30 MG capsule, Take 1 tablet by mouth 2 (two) times daily. , Disp: , Rfl:  .  furosemide (LASIX) 40 MG tablet, Take 40 mg by mouth daily., Disp: , Rfl:  .  Multiple Vitamin (MULTIVITAMIN) tablet, Take 1 tablet by mouth daily., Disp: , Rfl:  .  NON FORMULARY, 1 each by Other route See admin instructions. Use CPAP machine nightly., Disp: , Rfl:  .  omeprazole (PRILOSEC) 20 MG capsule, Take 20 mg by mouth 2 (two) times daily., Disp: , Rfl:  .  sacubitril-valsartan (ENTRESTO) 49-51 MG, Take 1 tablet by mouth 2 (two) times daily., Disp: 60 tablet, Rfl: 5 .  traZODone (DESYREL) 100 MG tablet, Take 1-2 tablets (100-200 mg total) by mouth at bedtime as needed., Disp: 60 tablet, Rfl: 5 .  HYDROcodone-acetaminophen (NORCO/VICODIN) 5-325 MG per tablet, Take 1 tablet by mouth every 6 (six) hours as needed for moderate pain.     (Patient not taking: Reported on 07/14/2018), Disp: 30 tablet, Rfl: 0 .  zolpidem (AMBIEN) 10 MG tablet, Take 10 mg by  mouth at bedtime as needed for sleep. 1/2 - 1 qhs prn sleep, Disp: , Rfl:  Medication Side Effects: none  Family Medical/ Social History: Changes? No  MENTAL HEALTH EXAM:  There were no vitals taken for this visit.There is no height or weight on file to calculate BMI.  General Appearance: Well Groomed and Obese  Eye Contact:  Good  Speech:  Clear and Coherent  Volume:  Normal  Mood:  Euthymic  Affect:  Appropriate  Thought Process:  Goal Directed  Orientation:  Full (Time, Place, and Person)  Thought Content: Logical   Suicidal Thoughts:  No  Homicidal Thoughts:  No  Memory:  WNL  Judgement:  Good  Insight:  Good  Psychomotor  Activity:  Normal for him.  Uses walking cane  Concentration:  Concentration: Good  Recall:  Good  Fund of Knowledge: Good  Language: Good  Assets:  Desire for Improvement  ADL's:  Intact  Cognition: WNL  Prognosis:  Good    DIAGNOSES:    ICD-10-CM   1. Major depressive disorder, recurrent episode, moderate ([facility]) F33.1   2. Insomnia, unspecified type G47.00   To full physical health problems including obstructive sleep apnea using his CPAP, CHF, OA in knees bilaterally, morbid obesity, hypertension, diabetes, asthma  Receiving Psychotherapy: Yes With Rinaldo Cloud, LCSW   RECOMMENDATIONS: I think he is doing very well overall.  Continue the same treatment as noted above. Continue psychotherapy with Rinaldo Cloud, LCSW. Return in 6 months.   Donnal Moat, PA-C

## 2018-07-21 ENCOUNTER — Ambulatory Visit: Payer: Medicare Other | Admitting: Neurology

## 2018-08-01 ENCOUNTER — Other Ambulatory Visit: Payer: Self-pay

## 2018-08-01 MED ORDER — ZOLPIDEM TARTRATE 10 MG PO TABS: 10.0000 mg | ORAL_TABLET | Freq: Every evening | ORAL | 1 refills | Status: DC | PRN

## 2018-08-01 MED ORDER — TRAZODONE HCL 100 MG PO TABS: 100.0000 mg | ORAL_TABLET | Freq: Every evening | ORAL | 1 refills | Status: DC | PRN

## 2018-08-11 ENCOUNTER — Ambulatory Visit: Payer: Medicare Other | Admitting: Psychiatry

## 2018-09-04 ENCOUNTER — Encounter: Payer: Self-pay | Admitting: Neurology

## 2018-09-08 ENCOUNTER — Encounter: Payer: Self-pay | Admitting: Neurology

## 2018-09-08 ENCOUNTER — Ambulatory Visit (INDEPENDENT_AMBULATORY_CARE_PROVIDER_SITE_OTHER): Payer: Medicare Other | Admitting: Neurology

## 2018-09-08 VITALS — BP 147/88 | HR 87 | Ht 71.0 in | Wt 338.0 lb

## 2018-09-08 DIAGNOSIS — Z9989 Dependence on other enabling machines and devices: Secondary | ICD-10-CM | POA: Diagnosis not present

## 2018-09-08 DIAGNOSIS — G4733 Obstructive sleep apnea (adult) (pediatric): Secondary | ICD-10-CM | POA: Diagnosis not present

## 2018-09-08 NOTE — Progress Notes (Signed)
Subjective:    Patient ID: [name] is a 58 y.o. male.  HPI     Interim history:  [name] is a 58 year old right-handed gentleman with an underlying medical history of hypertension, type 2 diabetes, anemia, reflux disease, b/l knee arthritis, childhood asthma, dilated cardiomyopathy, and morbid obesity with BMI of over 9, who presents for follow-up consultation of his obstructive sleep apnea, after a recent sleep study testing and starting AutoPap therapy. The patient is accompanied by his wife today. I first met him on 04/12/2018 at the request of his primary care physician, at which time the patient reported a prior diagnosis of OSA and he was on CPAP therapy for years. He was compliant with CPAP at the time. He needed new equipment. He was advised to proceed with a sleep study format dated diagnosis and to qualify for new equipment. He had a baseline sleep study on 06/10/2018. I went over his test results with him in detail today. Sleep efficiency was low at 44.4% with a delayed sleep onset at 184.5 minutes and absence of REM sleep. Total AHI was in the severe range at 37.7 per hour, average oxygen saturation of 96%, nadir of 86%. He had no significant PLMS. The study was limited secondary to poor sleep consolidation and absence of supine and absence of REM sleep. He was advised to proceed with AutoPap therapy at home.  Today, 09/08/2018: I reviewed his AutoPap compliance data from 08/06/2018 through 09/04/2012 which is a total of 30 days, during which time he used his AutoPap every night with percent used days greater than 4 hours at 100%, indicating superb compliance with an average usage of 9 hours and 4 minutes, residual AHI at goal at 0.2 per hour, leak on the low end with the 95th percentile at 4.9 L/m, 95th percentile of pressure at 12.1 cm with a range of 10 cm to 16 cm with EPR. He reports doing well, likes his new machine, feels that the pressure is more adequate. He is using a  nasal mask. He does report he had a hard time sleeping during the sleep study because he was not able to use CPAP or AutoPap at the time. He denies any telltale symptoms of restless leg syndrome, but does have knee pain, takes hydrocodone as needed, sparingly, prescribed by primary care physician.  The patient's allergies, current medications, family history, past medical history, past social history, past surgical history and problem list were reviewed and updated as appropriate.   Previously:   04/12/2018: (He) reports a prior diagnosis of obstructive sleep apnea and he has been on CPAP therapy for years. He is on his second CPAP machine. Sleep study testing was about 16 years ago and prior study results are not available for my review today. I was able to review his compliance data from his AutoPap machine from 03/13/2018 through 04/11/2018, which is a total of 30 days, during which time he used his AutoPap every night with percent used days greater than 4 hours at 100%, indicating superb compliance with an average usage of 9 hours and 25 minutes, residual AHI at goal at 0.4 per hour, 90th percentile pressure at 11.3 cm with range of 10 cm to 20 cm, leak on the low side. His DME company has been [employer]. He is married and lives with his family which includes his wife; they have 3 grown daughters. He is on disability. He has gained weight over time. When he was first diagnosed with sleep apnea  he was told it was severe. He had improvement on CPAP. He is fully compliant with treatment and states that he really cannot sleep without it. He suspects that his father had sleep apnea. Father died at age 60 from complications of his heart disease, diabetes and kidney disease. His Epworth sleepiness score is 4 out of 24, fatigue score is 42 out of 63. I reviewed your office note from 03/31/2018, which you kindly included. He had a tonsillectomy as a child. He has had difficulty going to sleep and staying asleep. He is  on trazodone 100 mg strength typically takes 50 mg each night, can take up to 150 mg. He quit smoking in 1997, drinks alcohol occasionally, maybe on weekends, caffeine and limitation, not daily, typically in the form of soda.  He has had a surplus of supplies for years. He was notified recently by his DME company that he would need a new prescription for supplies. He would like to avoid a sleep study if at all possible as he worries about not being able to sleep without his machine.  His Past Medical History Is Significant For: Past Medical History:  Diagnosis Date  . Abscess of anal and rectal regions   . Acute renal insufficiency   . Asthma   . Chest pain   . Diabetes mellitus    "pre diabetic"- no meds  . DOE (dyspnea on exertion)   . Hemorrhoids, internal   . Hyperlipidemia   . Hypertension   . Sleep apnea     His Past Surgical History Is Significant For: Past Surgical History:  Procedure Laterality Date  . COLONOSCOPY    . INCISION AND DRAINAGE PERIRECTAL ABSCESS  06/09/11  . LEFT HEART CATH AND CORONARY ANGIOGRAPHY N/A 08/06/2017   Procedure: LEFT HEART CATH AND CORONARY ANGIOGRAPHY;  Surgeon: Burnell Blanks, MD;  Location: [facility];  Service: Cardiovascular;  Laterality: N/A;  . POLYPECTOMY    . TONSILLECTOMY    . ULNAR COLLATERAL LIGAMENT RECONSTRUCTION  2003   left hand    His Family History Is Significant For: Family History  Problem Relation Age of Onset  . Diabetes Father   . Heart disease Father   . Diabetes Mother   . Heart disease Paternal Grandmother   . Colon cancer Neg Hx   . Esophageal cancer Neg Hx   . Rectal cancer Neg Hx   . Stomach cancer Neg Hx     His Social History Is Significant For: Social History   Socioeconomic History  . Marital status: Married    Spouse name: Not on file  . Number of children: 3  . Years of education: Not on file  . Highest education level: Not on file  Occupational History  . Not on file   Social Needs  . Financial resource strain: Not on file  . Food insecurity:    Worry: Not on file    Inability: Not on file  . Transportation needs:    Medical: Not on file    Non-medical: Not on file  Tobacco Use  . Smoking status: Former Smoker    Types: Cigarettes    Last attempt to quit: 12/15/1995    Years since quitting: 22.7  . Smokeless tobacco: Never Used  Substance and Sexual Activity  . Alcohol use: Yes    Alcohol/week: 2.0 standard drinks    Types: 2 Standard drinks or equivalent per week    Comment: Occasional      . Drug use:  No  . Sexual activity: Not on file  Lifestyle  . Physical activity:    Days per week: Not on file    Minutes per session: Not on file  . Stress: Not on file  Relationships  . Social connections:    Talks on phone: Not on file    Gets together: Not on file    Attends religious service: Not on file    Active member of club or organization: Not on file    Attends meetings of clubs or organizations: Not on file    Relationship status: Not on file  Other Topics Concern  . Not on file  Social History Narrative  . Not on file    His Allergies Are:  Allergies  Allergen Reactions  . Shellfish Allergy Nausea And Vomiting  :   His Current Medications Are:  Outpatient Encounter Medications as of 09/08/2018  Medication Sig  . acetaminophen (TYLENOL) 325 MG tablet Take 2 tablets (650 mg total) by mouth every 6 (six) hours as needed for mild pain (or Fever >/= 101).  Marland Kitchen albuterol (PROVENTIL HFA;VENTOLIN HFA) 108 (90 BASE) MCG/ACT inhaler Inhale 2 puffs into the lungs every 4 (four) hours as needed for wheezing or shortness of breath.  . ALPRAZolam (XANAX) 0.5 MG tablet Take 1 tablet (0.5 mg total) by mouth 2 (two) times daily as needed for anxiety.  Marland Kitchen aspirin 81 MG chewable tablet Chew 1 tablet (81 mg total) by mouth daily.  Marland Kitchen atorvastatin (LIPITOR) 10 MG tablet Take 10 mg by mouth daily.  . carvedilol (COREG) 6.25 MG tablet Take 1 tablet (6.25  mg total) by mouth 2 (two) times daily.  . DULoxetine (CYMBALTA) 30 MG capsule Take 1 tablet by mouth 2 (two) times daily.   . fluticasone furoate-vilanterol (BREO ELLIPTA) 100-25 MCG/INH AEPB Inhale 1 puff into the lungs daily.  . furosemide (LASIX) 40 MG tablet Take 40 mg by mouth daily.  Marland Kitchen HYDROcodone-acetaminophen (NORCO/VICODIN) 5-325 MG per tablet Take 1 tablet by mouth every 6 (six) hours as needed for moderate pain.      . Multiple Vitamin (MULTIVITAMIN) tablet Take 1 tablet by mouth daily.  . NON FORMULARY 1 each by Other route See admin instructions. Use CPAP machine nightly.  Marland Kitchen omeprazole (PRILOSEC) 20 MG capsule Take 20 mg by mouth 2 (two) times daily.  . sacubitril-valsartan (ENTRESTO) 49-51 MG Take 1 tablet by mouth 2 (two) times daily.  . traZODone (DESYREL) 100 MG tablet Take 1-2 tablets (100-200 mg total) by mouth at bedtime as needed.   No facility-administered encounter medications on file as of 09/08/2018.   :  Review of Systems:  Out of a complete 14 point review of systems, all are reviewed and negative with the exception of these symptoms as listed below: Review of Systems  Neurological:       Pt presents today to discuss his new cpap. Pt reports that his new cpap machine is working well.    Objective:  Neurological Exam  Physical Exam Physical Examination:   Vitals:   09/08/18 1458  BP: (!) 147/88  Pulse: 87   General Examination: The patient is a very pleasant 58 y.o. male in no acute distress. He appears well-developed and well-nourished and well groomed.   HEENT: Normocephalic, atraumatic, pupils are equal, round and reactive to light and accommodation. Extraocular tracking is good without limitation to gaze excursion or nystagmus noted. Corrective eyeglasses in place. Hearing is grossly intact. Face is symmetric, no dysarthria, no hypophonia. Neck  shows full range of motion. No carotid bruits. Airway examination reveals moderate mouth dryness and  market airway crowding. Tongue protrudes centrally and palate elevates symmetrically.  Chest: Clear to auscultation without wheezing, rhonchi or crackles noted.  Heart: S1+S2+0, regular and normal without murmurs, rubs or gallops noted.   Abdomen: Soft, non-tender and non-distended with normal bowel sounds appreciated on auscultation.  Extremities: There is trace pitting edema in the distal lower extremities bilaterally.   Skin: Warm and dry without trophic changes noted.  Musculoskeletal: exam reveals b/l knee pain.   Neurologically:  Mental status: The patient is awake, alert and oriented in all 4 spheres. His immediate and remote memory, attention, language skills and fund of knowledge are appropriate. There is no evidence of aphasia, agnosia, apraxia or anomia. Speech is clear with normal prosody and enunciation. Thought process is linear. Mood is normal and affect is normal.  Cranial nerves II - XII are as described above under HEENT exam.  Motor exam: Normal bulk, strength and tone is noted. There is no tremor.  Fine motor skills and coordination: intact with normal finger taps, normal hand movements, normal rapid alternating patting, normal foot taps and normal foot agility.  Cerebellar testing: No dysmetria or intention tremor, heel to shin not possible.  Sensory exam: intact to light touch.  Gait, station and balance: He stands with difficulty and has to push himself up, stands wide-based. He walks slowly and with difficulty, single-point cane in the right hand, slight limp noted on the right.   Assessment and Plan:  In summary, [name] is a very pleasant 58 year old male with an underlying complex medical history of hypertension, type 2 diabetes, anemia, reflux disease, b/l knee arthritis, childhood asthma, dilated cardiomyopathy, and morbid obesity with BMI of over 35, who presents for follow-up consultation of his obstructive sleep apnea after her recent sleep  study testing and starting treatment with a new AutoPap machine. He has a long-standing history of sleep apnea and has been compliant with positive airway treatment for years. He is commended for his treatment adherence, he is currently fully compliant and benefits from treatment and feels that his new machine works better than the old one. He is mindful of cleanliness of his AutoPap machine and is reminded to change the filter on a regular basis as well. He uses a nasal mask. His residual AHI is low and leak is also on the low side. He is advised to routinely follow-up in 1 year as he is not a novice to PAP therapy.  I answered all their questions today and the patient and his wife were in agreement. I spent 25 minutes in total face-to-face time with the patient, more than 50% of which was spent in counseling and coordination of care, reviewing test results, reviewing medication and discussing or reviewing the diagnosis of OSA, its prognosis and treatment options. Pertinent laboratory and imaging test results that were available during this visit with the patient were reviewed by me and considered in my medical decision making (see chart for details).

## 2018-09-08 NOTE — Patient Instructions (Signed)
Please continue using your CPAP regularly. While your insurance requires that you use CPAP at least 4 hours each night on 70% of the nights, I recommend, that you not skip any nights and use it throughout the night if you can. Getting used to CPAP and staying with the treatment long term does take time and patience and discipline. Untreated obstructive sleep apnea when it is moderate to severe can have an adverse impact on cardiovascular health and raise her risk for heart disease, arrhythmias, hypertension, congestive heart failure, stroke and diabetes. Untreated obstructive sleep apnea causes sleep disruption, nonrestorative sleep, and sleep deprivation. This can have an impact on your day to day functioning and cause daytime sleepiness and impairment of cognitive function, memory loss, mood disturbance, and problems focussing. Using CPAP regularly can improve these symptoms.  Keep up the good work! We can see you in 1 year, you can see one of our nurse practitioners as you are stable. 

## 2018-09-15 DIAGNOSIS — Z23 Encounter for immunization: Secondary | ICD-10-CM | POA: Diagnosis not present

## 2018-09-20 ENCOUNTER — Telehealth: Payer: Self-pay | Admitting: Cardiology

## 2018-09-20 NOTE — Telephone Encounter (Signed)
New Message    Patient calling the office for samples of medication:   1.  What medication and dosage are you requesting samples for? Entresto  2.  Are you currently out of this medication? Yes

## 2018-09-20 NOTE — Telephone Encounter (Signed)
Wife advised samples are available for pick up at the front desk.  Entresto 4-51 mg Qty: 1 bottle Lot # [id number] exp: 3/22

## 2018-09-29 ENCOUNTER — Ambulatory Visit: Payer: Medicare Other | Admitting: Psychiatry

## 2018-10-24 ENCOUNTER — Other Ambulatory Visit: Payer: Self-pay | Admitting: Cardiology

## 2018-10-24 ENCOUNTER — Telehealth: Payer: Self-pay

## 2018-10-24 DIAGNOSIS — I10 Essential (primary) hypertension: Secondary | ICD-10-CM

## 2018-10-24 DIAGNOSIS — I509 Heart failure, unspecified: Secondary | ICD-10-CM

## 2018-10-24 DIAGNOSIS — E7800 Pure hypercholesterolemia, unspecified: Secondary | ICD-10-CM

## 2018-10-24 DIAGNOSIS — I420 Dilated cardiomyopathy: Secondary | ICD-10-CM

## 2018-10-24 NOTE — Telephone Encounter (Signed)
Patient calling the office for samples of medication:   1.  What medication and dosage are you requesting samples for? Carvedilol and Entresto  2.  Are you currently out of this medication?  Yes- medicine have gone up so high with his insurance

## 2018-10-24 NOTE — Telephone Encounter (Signed)
Opened in error

## 2018-10-31 NOTE — Progress Notes (Signed)
HPI: FUcardiomyopathy and hypertension. Echocardiogram 11/18 showed EF 49-82%, grade 1 diastolic dysfunction, mild left atrial enlargement and mild right ventricular enlargement. RV function mildly reduced.Nuclear study November 2018 showed ejection fraction 31%, diaphragmatic attenuation and mild inferior ischemia could not be excluded.  Cardiac catheterization November 2018 showed normal coronary arteries and elevated left ventricular end-diastolic pressure of 26 mmHg.  Cardiac medications titrated. Repeat echocardiogram February 2019 showed improvement in LV function with ejection fraction 50 to 55% and mild left atrial enlargement.  Since last seen,  occasional mild dyspnea attributed to asthma.  No orthopnea, PND, pedal edema, chest pain or syncope.  Current Outpatient Medications  Medication Sig Dispense Refill  . acetaminophen (TYLENOL) 325 MG tablet Take 2 tablets (650 mg total) by mouth every 6 (six) hours as needed for mild pain (or Fever >/= 101).    Marland Kitchen albuterol (PROVENTIL HFA;VENTOLIN HFA) 108 (90 BASE) MCG/ACT inhaler Inhale 2 puffs into the lungs every 4 (four) hours as needed for wheezing or shortness of breath.    . ALPRAZolam (XANAX) 0.5 MG tablet Take 1 tablet (0.5 mg total) by mouth 2 (two) times daily as needed for anxiety. 30 tablet 0  . aspirin 81 MG chewable tablet Chew 1 tablet (81 mg total) by mouth daily.    Marland Kitchen atorvastatin (LIPITOR) 10 MG tablet Take 10 mg by mouth daily.    . carvedilol (COREG) 6.25 MG tablet TAKE 1 TABLET BY MOUTH TWICE A DAY 180 tablet 0  . DULoxetine (CYMBALTA) 30 MG capsule Take 1 tablet by mouth 2 (two) times daily.     . fluticasone furoate-vilanterol (BREO ELLIPTA) 100-25 MCG/INH AEPB Inhale 1 puff into the lungs daily.    . furosemide (LASIX) 40 MG tablet Take 40 mg by mouth daily.    Marland Kitchen HYDROcodone-acetaminophen (NORCO/VICODIN) 5-325 MG per tablet Take 1 tablet by mouth every 6 (six) hours as needed for moderate pain.     30 tablet 0   . Multiple Vitamin (MULTIVITAMIN) tablet Take 1 tablet by mouth daily.    . NON FORMULARY 1 each by Other route See admin instructions. Use CPAP machine nightly.    Marland Kitchen omeprazole (PRILOSEC) 20 MG capsule Take 20 mg by mouth 2 (two) times daily.    . sacubitril-valsartan (ENTRESTO) 49-51 MG Take 1 tablet by mouth 2 (two) times daily. 60 tablet 5  . traZODone (DESYREL) 100 MG tablet Take 1-2 tablets (100-200 mg total) by mouth at bedtime as needed. 180 tablet 1   No current facility-administered medications for this visit.      Past Medical History:  Diagnosis Date  . Abscess of anal and rectal regions   . Acute renal insufficiency   . Asthma   . Chest pain   . Diabetes mellitus    "pre diabetic"- no meds  . DOE (dyspnea on exertion)   . Hemorrhoids, internal   . Hyperlipidemia   . Hypertension   . Sleep apnea     Past Surgical History:  Procedure Laterality Date  . COLONOSCOPY    . INCISION AND DRAINAGE PERIRECTAL ABSCESS  06/09/11  . LEFT HEART CATH AND CORONARY ANGIOGRAPHY N/A 08/06/2017   Procedure: LEFT HEART CATH AND CORONARY ANGIOGRAPHY;  Surgeon: Burnell Blanks, MD;  Location: [facility];  Service: Cardiovascular;  Laterality: N/A;  . POLYPECTOMY    . TONSILLECTOMY    . ULNAR COLLATERAL LIGAMENT RECONSTRUCTION  2003   left hand    Social History   Socioeconomic  History  . Marital status: Married    Spouse name: Not on file  . Number of children: 3  . Years of education: Not on file  . Highest education level: Not on file  Occupational History  . Not on file  Social Needs  . Financial resource strain: Not on file  . Food insecurity:    Worry: Not on file    Inability: Not on file  . Transportation needs:    Medical: Not on file    Non-medical: Not on file  Tobacco Use  . Smoking status: Former Smoker    Types: Cigarettes    Last attempt to quit: 12/15/1995    Years since quitting: 22.9  . Smokeless tobacco: Never Used  Substance and  Sexual Activity  . Alcohol use: Yes    Alcohol/week: 2.0 standard drinks    Types: 2 Standard drinks or equivalent per week    Comment: Occasional      . Drug use: No  . Sexual activity: Not on file  Lifestyle  . Physical activity:    Days per week: Not on file    Minutes per session: Not on file  . Stress: Not on file  Relationships  . Social connections:    Talks on phone: Not on file    Gets together: Not on file    Attends religious service: Not on file    Active member of club or organization: Not on file    Attends meetings of clubs or organizations: Not on file    Relationship status: Not on file  . Intimate partner violence:    Fear of current or ex partner: Not on file    Emotionally abused: Not on file    Physically abused: Not on file    Forced sexual activity: Not on file  Other Topics Concern  . Not on file  Social History Narrative  . Not on file    Family History  Problem Relation Age of Onset  . Diabetes Father   . Heart disease Father   . Diabetes Mother   . Heart disease Paternal Grandmother   . Colon cancer Neg Hx   . Esophageal cancer Neg Hx   . Rectal cancer Neg Hx   . Stomach cancer Neg Hx     ROS: no fevers or chills, productive cough, hemoptysis, dysphasia, odynophagia, melena, hematochezia, dysuria, hematuria, rash, seizure activity, orthopnea, PND, pedal edema, claudication. Remaining systems are negative.  Physical Exam: Well-developed obese in no acute distress.  Skin is warm and dry.  HEENT is normal.  Neck is supple.  Chest is clear to auscultation with normal expansion.  Cardiovascular exam is regular rate and rhythm.  Abdominal exam nontender or distended. No masses palpated. Extremities show no edema. neuro grossly intact  ECG-normal sinus rhythm at a rate of 87, no ST changes.  Personally reviewed  A/P  1 nonischemic cardiomyopathy-previous cardiac catheterization showed no coronary disease.  Plan to continue medical  therapy including Entresto and carvedilol.  LV function has improved on most recent study.  Hypertension may have contributed to previous cardiomyopathy.  2 chronic combined systolic/diastolic congestive heart failure-he remains euvolemic on examination.  Continue present dose of Lasix (he is taking as needed).  Continue fluid restriction and low-sodium diet.  Check potassium and renal function.  3 hypertension-blood pressure is controlled today.  Continue present medications and follow.  4 hyperlipidemia-continue statin.  5 morbid obesity-we discussed the importance of diet, exercise and weight loss.  Aaron Edelman  Stanford Breed, MD

## 2018-11-03 ENCOUNTER — Ambulatory Visit: Payer: Medicare Other | Admitting: Psychiatry

## 2018-11-04 ENCOUNTER — Encounter: Payer: Self-pay | Admitting: Cardiology

## 2018-11-04 ENCOUNTER — Ambulatory Visit (INDEPENDENT_AMBULATORY_CARE_PROVIDER_SITE_OTHER): Payer: Medicare Other | Admitting: Cardiology

## 2018-11-04 VITALS — BP 128/86 | HR 87 | Ht 71.0 in | Wt 330.0 lb

## 2018-11-04 DIAGNOSIS — E7800 Pure hypercholesterolemia, unspecified: Secondary | ICD-10-CM | POA: Diagnosis not present

## 2018-11-04 DIAGNOSIS — I10 Essential (primary) hypertension: Secondary | ICD-10-CM

## 2018-11-04 DIAGNOSIS — I428 Other cardiomyopathies: Secondary | ICD-10-CM

## 2018-11-04 NOTE — Patient Instructions (Signed)
Medication Instructions:  NO CHANGE If you need a refill on your cardiac medications before your next appointment, please call your pharmacy.   Lab work: Your physician recommends that you HAVE LAB WORK TODAY If you have labs (blood work) drawn today and your tests are completely normal, you will receive your results only by: . MyChart Message (if you have MyChart) OR . A paper copy in the mail If you have any lab test that is abnormal or we need to change your treatment, we will call you to review the results.  Follow-Up: At [facility], you and your health needs are our priority.  As part of our continuing mission to provide you with exceptional heart care, we have created designated Provider Care Teams.  These Care Teams include your primary Cardiologist (physician) and Advanced Practice Providers (APPs -  Physician Assistants and Nurse Practitioners) who all work together to provide you with the care you need, when you need it. You will need a follow up appointment in 12 months.  Please call our office 2 months in advance to schedule this appointment.  You may see Brian Crenshaw, MD or one of the following Advanced Practice Providers on your designated Care Team:   Luke Kilroy, PA-C Krista Kroeger, PA-C . Callie Goodrich, PA-C     

## 2018-11-17 ENCOUNTER — Telehealth: Payer: Self-pay | Admitting: Cardiology

## 2018-11-17 NOTE — Telephone Encounter (Signed)
Patient aware samples are at the front desk for pick up  

## 2018-11-17 NOTE — Telephone Encounter (Signed)
Patient calling the office for samples of medication:   1.  What medication and dosage are you requesting samples for? sacubitril-valsartan (ENTRESTO) 49-51 MG  2.  Are you currently out of this medication?  He has one pill left.

## 2018-11-18 DIAGNOSIS — I428 Other cardiomyopathies: Secondary | ICD-10-CM | POA: Diagnosis not present

## 2018-11-19 LAB — BASIC METABOLIC PANEL
BUN / CREAT RATIO: 7 — AB (ref 9–20)
BUN: 8 mg/dL (ref 6–24)
CALCIUM: 9.4 mg/dL (ref 8.7–10.2)
CHLORIDE: 101 mmol/L (ref 96–106)
CO2: 22 mmol/L (ref 20–29)
Creatinine, Ser: 1.14 mg/dL (ref 0.76–1.27)
GFR, EST AFRICAN AMERICAN: 83 mL/min/{1.73_m2} (ref >59)
GFR, EST NON AFRICAN AMERICAN: 72 mL/min/{1.73_m2} (ref >59)
Glucose: 122 mg/dL — ABNORMAL HIGH (ref 65–99)
POTASSIUM: 4.5 mmol/L (ref 3.5–5.2)
Sodium: 139 mmol/L (ref 134–144)

## 2018-11-21 ENCOUNTER — Encounter: Payer: Self-pay | Admitting: *Deleted

## 2018-12-08 DIAGNOSIS — E119 Type 2 diabetes mellitus without complications: Secondary | ICD-10-CM | POA: Diagnosis not present

## 2018-12-08 DIAGNOSIS — F325 Major depressive disorder, single episode, in full remission: Secondary | ICD-10-CM | POA: Diagnosis not present

## 2018-12-08 DIAGNOSIS — I130 Hypertensive heart and chronic kidney disease with heart failure and stage 1 through stage 4 chronic kidney disease, or unspecified chronic kidney disease: Secondary | ICD-10-CM | POA: Diagnosis not present

## 2018-12-08 DIAGNOSIS — I5042 Chronic combined systolic (congestive) and diastolic (congestive) heart failure: Secondary | ICD-10-CM | POA: Diagnosis not present

## 2018-12-08 DIAGNOSIS — N182 Chronic kidney disease, stage 2 (mild): Secondary | ICD-10-CM | POA: Diagnosis not present

## 2018-12-08 DIAGNOSIS — G4733 Obstructive sleep apnea (adult) (pediatric): Secondary | ICD-10-CM | POA: Diagnosis not present

## 2018-12-21 ENCOUNTER — Ambulatory Visit (INDEPENDENT_AMBULATORY_CARE_PROVIDER_SITE_OTHER): Payer: Medicare Other | Admitting: Psychiatry

## 2018-12-21 ENCOUNTER — Other Ambulatory Visit: Payer: Self-pay

## 2018-12-21 DIAGNOSIS — F331 Major depressive disorder, recurrent, moderate: Secondary | ICD-10-CM | POA: Diagnosis not present

## 2018-12-21 NOTE — Progress Notes (Signed)
[facility] Counselor/Therapist Progress Note   Patient ID: [name], MRN: [id number]  Date: 12/21/2018   Timespent: 5 minutes 2:00pm to 2:50pm  Virtual Visit via Telephone Note I connected with patient by a video enabled telemedicine application or telephone, with their informed consent, and verified patient privacy and that I am speaking with the correct person using two identifiers. I am at [place] and  Patient is at his home.  I discussed the limitations, risks, security and privacy concerns of performing psychotherapy and management service by telephone and the availability of in person appointments. I also discussed with the patient that there may be a patient responsible charge related to this service. The patient expressed understanding and agreed to proceed. I discussed the treatment planning with the patient. The patient was provided an opportunity to ask questions and all were answered. The patient agreed with the plan and demonstrated an understanding of the instructions. The patient was advised to call  our office if  symptoms worsen or feel they are in a crisis state and need immediate contact.  Treatment Type: Individual  Subjective: Patient in today with some depression, anxiety, and fatigue. Tearfully explains that he lost a brother in [place] due to coronavirus. "He died alone because hospitals not letting people in to visit.  Burial to be next week but patient cannot attend.  Very sad at this loss and also the loss recently of a cousin in [place] due to the coronavirus.  Very tearfully explains some of the sadness he feels and seemed to feel better the more he expressed his feelings.  Patient reports that he has stayed well during all this coronavirus time.  Not sleeping as well since brother's death but is improving some now.  Tends to replay the messages about brother's death in his head, but is getting better with that.   Physically, as not had any  major flareups with abscesses like he's had in recent past.  ("some small ones but not major")  Following up from his earlier sleep study re: his sleep apnea.  "I can't sleep without my machine". " During my sleep study I had to sleep without the machine and it was frightening and I was really scared but I did get through it."  Results substantiated that he continues to need the machine and Medicare replaced his old machine.  States new machine is working well.    Issues with oldest daughter who had turned away from patient and wife, has resolved for the most part, as the daughter has changed some and is talking to patient and wife some now.  Youngest daughter and her 26 yr old daughter are both living with patient and his wife.  Goal review to note progress and patient acknowledges his perseverance in trying to move forward despite obstacles.    Interventions:Solution Focused, Strength-based and Supportive  Mental Status Exam:   Appearance:   Casual     Behavior:  Appropriate and Sharing  Motor:  Ambulatory with cane  Speech/Language:   Normal Rate  Affect:  Congruent  Mood:  anxious  Thought process:  Coherent  Thought content:    Logical  Perceptual disturbances:    Normal  Orientation:  Full (Time, Place, and Person)  Attention:  Good  Concentration:  good  Memory:  Immediate  Fund of knowledge:   Good  Insight:    Good  Judgment:   Good  Impulse Control:  good  Risk Assessment: Danger to Self:  No Self-injurious Behavior: No Danger to Others: No Duty to Warn:no Physical Aggression / Violence:No Access to Firearms a concern: No  Gang Involvement:No   Diagnosis:   ICD-10-CM   1. Major depressive disorder, recurrent episode, moderate (HCC) F33.1      Plan: Encouraged good self-care and strategies for managing his grief in healthy ways.  Next session with 1 month.  Michael Ace, LCSW

## 2018-12-28 IMAGING — NM NM MISC PROCEDURE
9 series · 54 of 54 positions shown · non-contrast
Comparison: none

[Series 1: wbr stress-gsp · 6.40mm/px · 6 of 512 frames shown]
[frame 43/512]
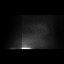
[frame 128/512]
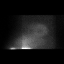
[frame 214/512]
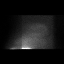
[frame 299/512]
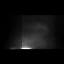
[frame 384/512]
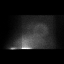
[frame 470/512]
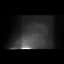

[Series 1: stress sax · 6.4mm · 6.40mm/px · 6 of 27 frames shown]
[frame 3/27]
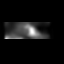
[frame 7/27]
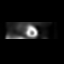
[frame 12/27]
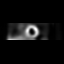
[frame 16/27]
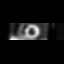
[frame 21/27]
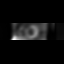
[frame 25/27]
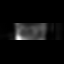

[Series 1: wbr_s-proj_st wbr stress-gsp · 6.40mm/px · 6 of 512 frames shown]
[frame 43/512]
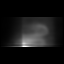
[frame 128/512]
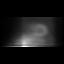
[frame 214/512]
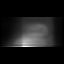
[frame 299/512]
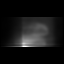
[frame 384/512]
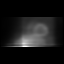
[frame 470/512]
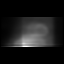

[Series 1: stress sax gs · 6.4mm · 6.40mm/px · 6 of 216 frames shown]
[frame 19/216]
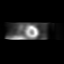
[frame 55/216]
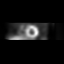
[frame 91/216]
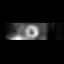
[frame 127/216]
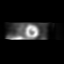
[frame 163/216]
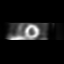
[frame 199/216]
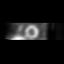

[Series 2: wbr_s-proj_st wbr stress-sum-em · 6.40mm/px · 6 of 64 frames shown]
[frame 6/64]
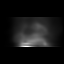
[frame 16/64]
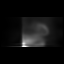
[frame 27/64]
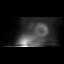
[frame 38/64]
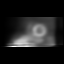
[frame 48/64]
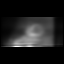
[frame 59/64]
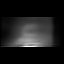

[Series 2: wbr stress-sum-em · 6.40mm/px · 6 of 64 frames shown]
[frame 6/64]
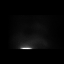
[frame 16/64]
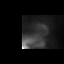
[frame 27/64]
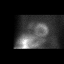
[frame 38/64]
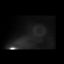
[frame 48/64]
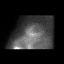
[frame 59/64]
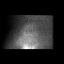

[Series 3: rest sax · 6.4mm · 6.40mm/px · 6 of 27 frames shown]
[frame 3/27]
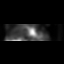
[frame 7/27]
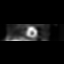
[frame 12/27]
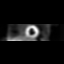
[frame 16/27]
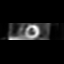
[frame 21/27]
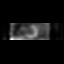
[frame 25/27]
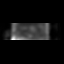

[Series 3: wbr_r-proj_st wbr rest · 6.40mm/px · 6 of 64 frames shown]
[frame 6/64]
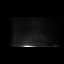
[frame 16/64]
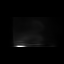
[frame 27/64]
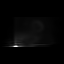
[frame 38/64]
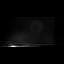
[frame 48/64]
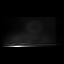
[frame 59/64]
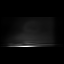

[Series 3: wbr rest · 6.40mm/px · 6 of 64 frames shown]
[frame 6/64]
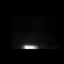
[frame 16/64]
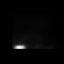
[frame 27/64]
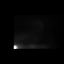
[frame 38/64]
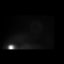
[frame 48/64]
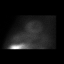
[frame 59/64]
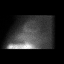

[54 of 54 positions shown; findings below may reference images not displayed]

Canned report from images found in remote index.

Refer to host system for actual result text.

## 2019-01-12 ENCOUNTER — Ambulatory Visit: Payer: Medicare Other | Admitting: Physician Assistant

## 2019-01-12 ENCOUNTER — Other Ambulatory Visit: Payer: Self-pay

## 2019-01-12 ENCOUNTER — Other Ambulatory Visit: Payer: Self-pay | Admitting: Cardiology

## 2019-01-12 DIAGNOSIS — E7800 Pure hypercholesterolemia, unspecified: Secondary | ICD-10-CM

## 2019-01-12 DIAGNOSIS — I420 Dilated cardiomyopathy: Secondary | ICD-10-CM

## 2019-01-12 DIAGNOSIS — I10 Essential (primary) hypertension: Secondary | ICD-10-CM

## 2019-01-12 DIAGNOSIS — I509 Heart failure, unspecified: Secondary | ICD-10-CM

## 2019-01-12 NOTE — Telephone Encounter (Signed)
Carvedilol 6.25 mg refilled. 

## 2019-01-19 ENCOUNTER — Ambulatory Visit: Payer: Medicare Other | Admitting: Psychiatry

## 2019-01-26 ENCOUNTER — Ambulatory Visit: Payer: Medicare Other | Admitting: Physician Assistant

## 2019-01-26 ENCOUNTER — Other Ambulatory Visit: Payer: Self-pay

## 2019-01-26 ENCOUNTER — Telehealth: Payer: Self-pay | Admitting: Physician Assistant

## 2019-01-26 NOTE — Telephone Encounter (Signed)
I called 3 times between 4:00 and 4:16 pm, for his 4 pm appt.  No anwer and I left msg each time on his VM.  Last time I asked him to call office and r/s appt

## 2019-02-19 ENCOUNTER — Other Ambulatory Visit: Payer: Self-pay | Admitting: Cardiology

## 2019-03-30 DIAGNOSIS — I5042 Chronic combined systolic (congestive) and diastolic (congestive) heart failure: Secondary | ICD-10-CM | POA: Diagnosis not present

## 2019-03-30 DIAGNOSIS — Z125 Encounter for screening for malignant neoplasm of prostate: Secondary | ICD-10-CM | POA: Diagnosis not present

## 2019-03-30 DIAGNOSIS — I10 Essential (primary) hypertension: Secondary | ICD-10-CM | POA: Diagnosis not present

## 2019-03-30 DIAGNOSIS — E119 Type 2 diabetes mellitus without complications: Secondary | ICD-10-CM | POA: Diagnosis not present

## 2019-04-04 DIAGNOSIS — R82998 Other abnormal findings in urine: Secondary | ICD-10-CM | POA: Diagnosis not present

## 2019-04-06 DIAGNOSIS — K30 Functional dyspepsia: Secondary | ICD-10-CM | POA: Diagnosis not present

## 2019-04-06 DIAGNOSIS — I5189 Other ill-defined heart diseases: Secondary | ICD-10-CM | POA: Diagnosis not present

## 2019-04-06 DIAGNOSIS — Z1331 Encounter for screening for depression: Secondary | ICD-10-CM | POA: Diagnosis not present

## 2019-04-06 DIAGNOSIS — F1011 Alcohol abuse, in remission: Secondary | ICD-10-CM | POA: Diagnosis not present

## 2019-04-06 DIAGNOSIS — Z0000 Encounter for general adult medical examination without abnormal findings: Secondary | ICD-10-CM | POA: Diagnosis not present

## 2019-04-06 DIAGNOSIS — E786 Lipoprotein deficiency: Secondary | ICD-10-CM | POA: Diagnosis not present

## 2019-04-06 DIAGNOSIS — F325 Major depressive disorder, single episode, in full remission: Secondary | ICD-10-CM | POA: Diagnosis not present

## 2019-04-06 DIAGNOSIS — N182 Chronic kidney disease, stage 2 (mild): Secondary | ICD-10-CM | POA: Diagnosis not present

## 2019-04-06 DIAGNOSIS — I130 Hypertensive heart and chronic kidney disease with heart failure and stage 1 through stage 4 chronic kidney disease, or unspecified chronic kidney disease: Secondary | ICD-10-CM | POA: Diagnosis not present

## 2019-04-06 DIAGNOSIS — E1122 Type 2 diabetes mellitus with diabetic chronic kidney disease: Secondary | ICD-10-CM | POA: Diagnosis not present

## 2019-04-06 DIAGNOSIS — G4733 Obstructive sleep apnea (adult) (pediatric): Secondary | ICD-10-CM | POA: Diagnosis not present

## 2019-04-06 DIAGNOSIS — Z1339 Encounter for screening examination for other mental health and behavioral disorders: Secondary | ICD-10-CM | POA: Diagnosis not present

## 2019-04-06 DIAGNOSIS — I5042 Chronic combined systolic (congestive) and diastolic (congestive) heart failure: Secondary | ICD-10-CM | POA: Diagnosis not present

## 2019-04-07 DIAGNOSIS — I10 Essential (primary) hypertension: Secondary | ICD-10-CM | POA: Diagnosis not present

## 2019-05-18 DIAGNOSIS — Z23 Encounter for immunization: Secondary | ICD-10-CM | POA: Diagnosis not present

## 2019-05-30 ENCOUNTER — Other Ambulatory Visit: Payer: Self-pay

## 2019-05-30 ENCOUNTER — Ambulatory Visit: Payer: Medicare Other | Admitting: Physician Assistant

## 2019-05-30 ENCOUNTER — Ambulatory Visit (INDEPENDENT_AMBULATORY_CARE_PROVIDER_SITE_OTHER): Payer: Medicare Other | Admitting: Psychiatry

## 2019-05-30 DIAGNOSIS — F331 Major depressive disorder, recurrent, moderate: Secondary | ICD-10-CM | POA: Diagnosis not present

## 2019-05-30 NOTE — Progress Notes (Signed)
[facility] Counselor/Therapist Progress Note  Patient ID: [name], MRN: [id number],    Date: 05/30/2019  Time Spent:  60 minutes   10:00am to 11:00am  Virtual Visit Video Note Connected with patient by a video enabled telemedicine/telehealth application or telephone, with their informed consent, and verified patient privacy and that I am speaking with the correct person using two identifiers. I discussed the limitations, risks, security and privacy concerns of performing psychotherapy and management service by telephone and the availability of in person appointments. I also discussed with the patient that there may be a patient responsible charge related to this service. The patient expressed understanding and agreed to proceed. I discussed the treatment planning with the patient. The patient was provided an opportunity to ask questions and all were answered. The patient agreed with the plan and demonstrated an understanding of the instructions. The patient was advised to call  our office if  symptoms worsen or feel they are in a crisis state and need immediate contact.   Therapist Location: [facility] Patient Location: home   Treatment Type: Individual Therapy  Reported Symptoms: Depression  Mental Status Exam:  Appearance:   Casual     Behavior:  Appropriate and Sharing  Motor:  Normal  Speech/Language:   Clear and Coherent  Affect:  Depressed  Mood:  depressed  Thought process:  normal  Thought content:    WNL  Sensory/Perceptual disturbances:    WNL  Orientation:  oriented to person, place, time/date, situation, day of week, month of year and year  Attention:  Good  Concentration:  Good  Memory:  WNL  Fund of knowledge:   Good  Insight:    Good  Judgment:   Good  Impulse Control:  Good   Risk Assessment: Danger to Self:  No Self-injurious Behavior: No Danger to Others: No Duty to Warn:no Physical Aggression / Violence:No  Access to  Firearms a concern: No  Gang Involvement:No   Subjective:  Patient reports continued depression/grief and dealing with lots of issues.  Brother died in" late March 2020 of COVID" and "I began drinking some and overdid it" but was able to stop the drinking and am now better. Today is "brother's birthday so am struggling some."   "Scary when I realized how I had increased my drinking and have stayed stopped with the drinking."  One of his daughters was pregant at 7 months and "baby died inside of her" earlier this month. Had funeral and buried baby and my wife and I visit at the cemetery occasionally.  "I really really miss my brother and need to talk about him".  Processed issues of grief and loss with patient and his situation is complicated due to family relationships and how some things have happened over time. Especially working with "bad memories" of his death and certain things that happened that made the situation more stressful.  Some sleep issues initially after latest family death, and Leslie Dales is helping.     Interventions: Cognitive Behavioral Therapy and Ego-Supportive  Diagnosis:   ICD-10-CM   1. Major depressive disorder, recurrent episode, moderate ([place])  F33.1     Plan:  Patient not signing tx plan on computer screen due to [place].  Treatment Goals: Goals may remain on the tx plan as patient works on strategies to achieve goals.  Progress or lack of progress will be documented each session in "Progress" section on Plan.  Long term goal: Develop the ability to recognize, accept,  and cope with feelings of depression.  Short term goal: Identify and replace depressive thinking that leads to depressive feelings and actions.  Strategy: Educate patient about cognitive restructuring, including self-monitoring of automatic thoughts, and working to interrupt his depressive thought patterns and replace with cognitive patterns that do not support depression.  Progress: Patient came  in today after recent death of unborn grandchild due to complications.  Used time on the front end of session for him to process this loss and feel supported, as well as gain some thoughts and ideas that may help him in his grieving and eventually moving forward some.  He had already been dealing with the earlier loss of a brother due to [place] so his grief had accumulated.  Had good session today and he was very grateful for some suggestions and the support.   Also discussed his update tx plan which we reviewed today.  Right now his main focus needs to be on working more on his grief which he plans to do.  We will pick up on his regular tx plan her next session.    To return in approx 3 weeks and will call if needed before that time.     Shanon Ace, LCSW

## 2019-06-22 ENCOUNTER — Other Ambulatory Visit: Payer: Self-pay

## 2019-06-22 ENCOUNTER — Ambulatory Visit (INDEPENDENT_AMBULATORY_CARE_PROVIDER_SITE_OTHER): Payer: Medicare Other | Admitting: Psychiatry

## 2019-06-22 DIAGNOSIS — F331 Major depressive disorder, recurrent, moderate: Secondary | ICD-10-CM | POA: Diagnosis not present

## 2019-06-22 NOTE — Progress Notes (Signed)
      [facility] Counselor/Therapist Progress Note  Patient ID: [name], MRN: [id number],    Date: 06/22/2019  Time Spent: 60 minutes    2:00pm to 3:00pm  Treatment Type: Individual Therapy  Reported Symptoms: depression, anxiety, unresolved grief of 2 family members  Mental Status Exam:  Appearance:   Casual     Behavior:  Appropriate and Sharing  Motor:  Normal  Speech/Language:   Normal Rate  Affect:  Depressed and anxious  Mood:  anxious and depressed  Thought process:  goal directed  Thought content:    WNL  Sensory/Perceptual disturbances:    WNL  Orientation:  oriented to person, place, time/date, situation, day of week, month of year and year  Attention:  Good  Concentration:  Good  Memory:  WNL  Fund of knowledge:   Good  Insight:    Fair  Judgment:   Good  Impulse Control:  Good   Risk Assessment: Danger to Self:  No Self-injurious Behavior: No Danger to Others: No Duty to Warn:no Physical Aggression / Violence:No  Access to Firearms a concern: No  Gang Involvement:No   Subjective: Patient today reports being some better since last session.  Things have been some better with his older daughter.  Some decrease in depression.  Better communication with older daughter.  Concerned about his own health and still struggling some with grandbaby's death and brother's death.    Interventions: Cognitive Behavioral Therapy and Ego-Supportive  Diagnosis:   ICD-10-CM   1. Major depressive disorder, recurrent episode, moderate ([place])  F33.1     Plan:  Patient not signing tx plan on computer screen due to [place].  Treatment Goals: Goals may remain on the tx plan as patient works on strategies to achieve goals.  Progress or lack of progress will be documented each session in "Progress" section on Plan.  Long term goal: Develop the ability to recognize, accept, and cope with feelings of depression.  Short term goal: Identify and replace depressive  thinking that leads to depressive feelings and actions.  Strategy: Educate patient about cognitive restructuring, including self-monitoring of automatic thoughts, and working to interrupt his depressive thought patterns and replace with cognitive patterns that do not support depression.  Progress: Patient worked today initially on some more of his unresolved grief issues with death of brother and grandbaby. More accepting of himself and his grief today and seemed to appreciate the time to talk through feelings.  Also worked on his long and short term goal especially in regards to his depression.  Trying better understand his depressed thoughts and how they lead him to feel more depressed.  Talked about the connection between the two, and he is to follow through on some self-monitoring of his thoughts, trying to interrupt and then replace the depressive/negative/or anxious thought with thought patterns that are more positive, reality-based, and self-affirming for patient.  To pay particular attention to how his feeling may change with the change in thoughts and to report out on this next session.  Goal review and progress noted with patient.    Next appt within 3 weeks.   Shanon Ace, LCSW

## 2019-07-25 ENCOUNTER — Telehealth: Payer: Self-pay | Admitting: Cardiology

## 2019-07-25 NOTE — Telephone Encounter (Signed)
Patient calling the office for samples of medication:   1.  What medication and dosage are you requesting samples for? Entresto not sure  2.  Are you currently out of this medication? No 2 pills left

## 2019-07-26 NOTE — Telephone Encounter (Signed)
Follow up    Request for Entresto samples

## 2019-07-27 NOTE — Telephone Encounter (Signed)
Medication samples have been provided to the patient.  Drug name: Alyson Reedy: #[id number] LOT: [place].Jerry Exp.Date: 03/2021  Samples left at front desk for patient pick-up. Patient's wife, Ebony Hail, notified.  [place], [place] 9:15 AM 07/27/2019

## 2019-08-08 DIAGNOSIS — I5189 Other ill-defined heart diseases: Secondary | ICD-10-CM | POA: Diagnosis not present

## 2019-08-08 DIAGNOSIS — I130 Hypertensive heart and chronic kidney disease with heart failure and stage 1 through stage 4 chronic kidney disease, or unspecified chronic kidney disease: Secondary | ICD-10-CM | POA: Diagnosis not present

## 2019-08-08 DIAGNOSIS — J45909 Unspecified asthma, uncomplicated: Secondary | ICD-10-CM | POA: Diagnosis not present

## 2019-08-08 DIAGNOSIS — E1122 Type 2 diabetes mellitus with diabetic chronic kidney disease: Secondary | ICD-10-CM | POA: Diagnosis not present

## 2019-08-08 DIAGNOSIS — G4733 Obstructive sleep apnea (adult) (pediatric): Secondary | ICD-10-CM | POA: Diagnosis not present

## 2019-08-08 DIAGNOSIS — I5042 Chronic combined systolic (congestive) and diastolic (congestive) heart failure: Secondary | ICD-10-CM | POA: Diagnosis not present

## 2019-08-08 DIAGNOSIS — F325 Major depressive disorder, single episode, in full remission: Secondary | ICD-10-CM | POA: Diagnosis not present

## 2019-08-08 DIAGNOSIS — F1011 Alcohol abuse, in remission: Secondary | ICD-10-CM | POA: Diagnosis not present

## 2019-08-21 NOTE — Progress Notes (Signed)
Virtual Visit via Video Note changed to phone visit at patient request   This visit type was conducted due to national recommendations for restrictions regarding the COVID-19 Pandemic (e.g. social distancing) in an effort to limit this patient's exposure and mitigate transmission in our community.  Due to his co-morbid illnesses, this patient is at least at moderate risk for complications without adequate follow up.  This format is felt to be most appropriate for this patient at this time.  All issues noted in this document were discussed and addressed.  A limited physical exam was performed with this format.  Please refer to the patient's chart for his consent to telehealth for [facility].   Date:  08/25/2019   ID:  [name], DOB Jun 03, 1961, MRN [id number]  Patient Location:Home Provider Location: Home  PCP:  Shon Baton, MD  Cardiologist:  Dr Stanford Breed  Evaluation Performed:  Follow-Up Visit  Chief Complaint:  FU CM  History of Present Illness:    FUcardiomyopathy andhypertension. Echocardiogram 11/18 showed [id number], grade 1 diastolic dysfunction, mild left atrial enlargement and mild right ventricular enlargement. RV function mildly reduced.Nuclear study November 2018 showed ejection fraction 31%, diaphragmatic attenuation and mild inferior ischemia could not be excluded.Cardiac catheterization November 2018 showed normal coronary arteries and elevated left ventricular end-diastolic pressure of 26 mmHg. Cardiac medications titrated. Repeat echocardiogram February 2019 showed improvement in LV function with ejection fraction 50 to 55% and mild left atrial enlargement. Since last seen,patient has dyspnea on exertion unchanged.  No orthopnea, PND, chest pain or syncope.  The patient does not have symptoms concerning for COVID-19 infection (fever, chills, cough, or new shortness of breath).    Past Medical History:  Diagnosis Date  . Abscess of anal and rectal regions    . Acute renal insufficiency   . Asthma   . Chest pain   . Diabetes mellitus    "pre diabetic"- no meds  . DOE (dyspnea on exertion)   . Hemorrhoids, internal   . Hyperlipidemia   . Hypertension   . Sleep apnea    Past Surgical History:  Procedure Laterality Date  . COLONOSCOPY    . INCISION AND DRAINAGE PERIRECTAL ABSCESS  06/09/11  . LEFT HEART CATH AND CORONARY ANGIOGRAPHY N/A 08/06/2017   Procedure: LEFT HEART CATH AND CORONARY ANGIOGRAPHY;  Surgeon: Burnell Blanks, MD;  Location: [facility];  Service: Cardiovascular;  Laterality: N/A;  . POLYPECTOMY    . TONSILLECTOMY    . ULNAR COLLATERAL LIGAMENT RECONSTRUCTION  2003   left hand     Current Meds  Medication Sig  . acetaminophen (TYLENOL) 325 MG tablet Take 2 tablets (650 mg total) by mouth every 6 (six) hours as needed for mild pain (or Fever >/= 101).  Marland Kitchen albuterol (PROVENTIL HFA;VENTOLIN HFA) 108 (90 BASE) MCG/ACT inhaler Inhale 2 puffs into the lungs every 4 (four) hours as needed for wheezing or shortness of breath.  . ALPRAZolam (XANAX) 0.5 MG tablet Take 1 tablet (0.5 mg total) by mouth 2 (two) times daily as needed for anxiety.  Marland Kitchen aspirin 81 MG chewable tablet Chew 1 tablet (81 mg total) by mouth daily.  Marland Kitchen atorvastatin (LIPITOR) 10 MG tablet Take 10 mg by mouth daily.  . carvedilol (COREG) 6.25 MG tablet TAKE 1 TABLET BY MOUTH TWICE A DAY  . DULoxetine (CYMBALTA) 30 MG capsule Take 1 tablet by mouth 2 (two) times daily.   Marland Kitchen ENTRESTO 49-51 MG TAKE 1 TABLET BY MOUTH TWICE A DAY  .  Fluticasone-Salmeterol (ADVAIR) 250-50 MCG/DOSE AEPB Inhale 1 puff into the lungs 2 (two) times daily.  . furosemide (LASIX) 40 MG tablet Take 40 mg by mouth daily.  Marland Kitchen HYDROcodone-acetaminophen (NORCO/VICODIN) 5-325 MG per tablet Take 1 tablet by mouth every 6 (six) hours as needed for moderate pain.      . Multiple Vitamin (MULTIVITAMIN) tablet Take 1 tablet by mouth daily.  . NON FORMULARY 1 each by Other route See  admin instructions. Use CPAP machine nightly.  Marland Kitchen omeprazole (PRILOSEC) 20 MG capsule Take 20 mg by mouth 2 (two) times daily.  . traZODone (DESYREL) 100 MG tablet Take 1-2 tablets (100-200 mg total) by mouth at bedtime as needed.  . [DISCONTINUED] fluticasone furoate-vilanterol (BREO ELLIPTA) 100-25 MCG/INH AEPB Inhale 1 puff into the lungs daily.     Allergies:   Shellfish allergy   Social History   Tobacco Use  . Smoking status: Former Smoker    Types: Cigarettes    Quit date: 12/15/1995    Years since quitting: 23.7  . Smokeless tobacco: Never Used  Substance Use Topics  . Alcohol use: Yes    Alcohol/week: 2.0 standard drinks    Types: 2 Standard drinks or equivalent per week    Comment: Occasional      . Drug use: No     Family Hx: The patient's family history includes Diabetes in his father and mother; Heart disease in his father and paternal grandmother. There is no history of Colon cancer, Esophageal cancer, Rectal cancer, or Stomach cancer.  ROS:   Please see the history of present illness.    No Fever, chills  or productive cough All other systems reviewed and are negative.  Recent Labs: 11/18/2018: BUN 8; Creatinine, Ser 1.14; Potassium 4.5; Sodium 139   Wt Readings from Last 3 Encounters:  08/25/19 (!) 325 lb (147.4 kg)  11/04/18 (!) 330 lb (149.7 kg)  09/08/18 (!) 338 lb (153.3 kg)     Objective:    Vital Signs:  BP 112/81   Pulse 98   Ht 5\' 11"  (1.803 m)   Wt (!) 325 lb (147.4 kg)   BMI 45.33 kg/m    VITAL SIGNS:  reviewed NAD Answers questions appropriately Normal affect Remainder of physical examination not performed (telehealth visit; coronavirus pandemic)  ASSESSMENT & PLAN:    1. Nonischemic cardiomyopathy-plan to continue Entresto and carvedilol at present dose.  LV function has improved on most recent echocardiogram; will repeat study.  Cardiomyopathy may have been hypertensive mediated. 2. Chronic combined systolic/diastolic congestive  heart failure-patient is euvolemic today on examination.  Continue Lasix as needed.  Continue fluid restriction and low-sodium diet.  3. Hyperlipidemia-continue statin. 4. Hypertension-blood pressure controlled.  Continue present medical regimen. 5. Morbid obesity-we discussed the importance of diet, exercise and weight loss today.  COVID-19 Education: The importance of social distancing was discussed today.  Time:   Today, I have spent 16 minutes with the patient with telehealth technology discussing the above problems.     Medication Adjustments/Labs and Tests Ordered: Current medicines are reviewed at length with the patient today.  Concerns regarding medicines are outlined above.   Tests Ordered: No orders of the defined types were placed in this encounter.   Medication Changes: No orders of the defined types were placed in this encounter.   Follow Up:  Either In Person or Virtual in 1 year(s)  Signed, Kirk Ruths, MD  08/25/2019 8:00 AM    [place]

## 2019-08-25 ENCOUNTER — Encounter: Payer: Self-pay | Admitting: Cardiology

## 2019-08-25 ENCOUNTER — Telehealth (INDEPENDENT_AMBULATORY_CARE_PROVIDER_SITE_OTHER): Payer: Medicare Other | Admitting: Cardiology

## 2019-08-25 VITALS — BP 112/81 | HR 98 | Ht 71.0 in | Wt 325.0 lb

## 2019-08-25 DIAGNOSIS — E7800 Pure hypercholesterolemia, unspecified: Secondary | ICD-10-CM | POA: Diagnosis not present

## 2019-08-25 DIAGNOSIS — I428 Other cardiomyopathies: Secondary | ICD-10-CM

## 2019-08-25 DIAGNOSIS — I110 Hypertensive heart disease with heart failure: Secondary | ICD-10-CM

## 2019-08-25 DIAGNOSIS — I5042 Chronic combined systolic (congestive) and diastolic (congestive) heart failure: Secondary | ICD-10-CM | POA: Diagnosis not present

## 2019-08-25 DIAGNOSIS — I10 Essential (primary) hypertension: Secondary | ICD-10-CM

## 2019-08-25 NOTE — Patient Instructions (Signed)
Medication Instructions:  NO CHANGE *If you need a refill on your cardiac medications before your next appointment, please call your pharmacy*  Lab Work: If you have labs (blood work) drawn today and your tests are completely normal, you will receive your results only by: Marland Kitchen MyChart Message (if you have MyChart) OR . A paper copy in the mail If you have any lab test that is abnormal or we need to change your treatment, we will call you to review the results.  Testing/Procedures: Your physician has requested that you have an echocardiogram. Echocardiography is a painless test that uses sound waves to create images of your heart. It provides your doctor with information about the size and shape of your heart and how well your heart's chambers and valves are working. This procedure takes approximately one hour. There are no restrictions for this procedure.[place]    Follow-Up: At [facility], you and your health needs are our priority.  As part of our continuing mission to provide you with exceptional heart care, we have created designated Provider Care Teams.  These Care Teams include your primary Cardiologist (physician) and Advanced Practice Providers (APPs -  Physician Assistants and Nurse Practitioners) who all work together to provide you with the care you need, when you need it.  Your next appointment:   12 month(s)  The format for your next appointment:   Either In Person or Virtual  Provider:   You may see Kirk Ruths, MD or one of the following Advanced Practice Providers on your designated Care Team:    Kerin Ransom, PA-C  [place], [place]  Coletta Memos, [place]

## 2019-09-07 ENCOUNTER — Ambulatory Visit (HOSPITAL_COMMUNITY): Payer: Medicare Other | Attending: Cardiovascular Disease

## 2019-09-07 ENCOUNTER — Other Ambulatory Visit: Payer: Self-pay

## 2019-09-07 DIAGNOSIS — I428 Other cardiomyopathies: Secondary | ICD-10-CM | POA: Diagnosis not present

## 2019-09-14 ENCOUNTER — Telehealth (INDEPENDENT_AMBULATORY_CARE_PROVIDER_SITE_OTHER): Payer: Medicare Other | Admitting: Adult Health

## 2019-09-14 ENCOUNTER — Ambulatory Visit: Payer: Self-pay | Admitting: Adult Health

## 2019-09-14 DIAGNOSIS — Z9989 Dependence on other enabling machines and devices: Secondary | ICD-10-CM

## 2019-09-14 DIAGNOSIS — G4733 Obstructive sleep apnea (adult) (pediatric): Secondary | ICD-10-CM

## 2019-09-14 NOTE — Progress Notes (Addendum)
PATIENT: [name] DOB: 1961-01-29  REASON FOR VISIT: follow up HISTORY FROM: patient  Virtual Visit via Video Note  I connected with [name] on 09/14/19 at  3:00 PM EST by a video enabled telemedicine application located remotely at [facility] and verified that I am speaking with the correct person using two identifiers who was located at their own home.   I discussed the limitations of evaluation and management by telemedicine and the availability of in person appointments. The patient expressed understanding and agreed to proceed.   PATIENT: [name] [facility] DOB: Jul 09, 1961  REASON FOR VISIT: follow up HISTORY FROM: patient  HISTORY OF PRESENT ILLNESS: Today 09/14/19:  [name] is a 59 year old male with a history of obstructive sleep apnea on CPAP.  His download indicates that he uses machine nightly for compliance of 100% he uses machine greater than 4 hours each night.  On average he uses his machine 8 hours and 18 minutes.  His residual AHI is 0.4 on 10 to 16 cm of water with EPR of 2.  His leak in the 95th percentile is 8. He reports that the CPAP is working well for him.  He takes trazodone before bedtime.  He states sometimes this may make him more sleepy in the mornings but this usually resolves.  He returns today for an evaluation.  HISTORY 09/08/2018: I reviewed his AutoPap compliance data from 08/06/2018 through 09/04/2012 which is a total of 30 days, during which time he used his AutoPap every night with percent used days greater than 4 hours at 100%, indicating superb compliance with an average usage of 9 hours and 4 minutes, residual AHI at goal at 0.2 per hour, leak on the low end with the 95th percentile at 4.9 L/m, 95th percentile of pressure at 12.1 cm with a range of 10 cm to 16 cm with EPR. He reports doing well, likes his new machine, feels that the pressure is more adequate. He is using a nasal mask. He does report he  had a hard time sleeping during the sleep study because he was not able to use CPAP or AutoPap at the time. He denies any telltale symptoms of restless leg syndrome, but does have knee pain, takes hydrocodone as needed, sparingly, prescribed by primary care physician.  The patient's allergies, current medications, family history, past medical history, past social history, past surgical history and problem list were reviewed and updated as appropriate.   REVIEW OF SYSTEMS: Out of a complete 14 system review of symptoms, the patient complains only of the following symptoms, and all other reviewed systems are negative.  ALLERGIES: Allergies  Allergen Reactions  . Shellfish Allergy Nausea And Vomiting    HOME MEDICATIONS: Outpatient Medications Prior to Visit  Medication Sig Dispense Refill  . acetaminophen (TYLENOL) 325 MG tablet Take 2 tablets (650 mg total) by mouth every 6 (six) hours as needed for mild pain (or Fever >/= 101).    Marland Kitchen albuterol (PROVENTIL HFA;VENTOLIN HFA) 108 (90 BASE) MCG/ACT inhaler Inhale 2 puffs into the lungs every 4 (four) hours as needed for wheezing or shortness of breath.    . ALPRAZolam (XANAX) 0.5 MG tablet Take 1 tablet (0.5 mg total) by mouth 2 (two) times daily as needed for anxiety. 30 tablet 0  . aspirin 81 MG chewable tablet Chew 1 tablet (81 mg total) by mouth daily.    Marland Kitchen atorvastatin (LIPITOR) 10 MG tablet Take 10 mg by mouth daily.    Marland Kitchen  carvedilol (COREG) 6.25 MG tablet TAKE 1 TABLET BY MOUTH TWICE A DAY 180 tablet 3  . DULoxetine (CYMBALTA) 30 MG capsule Take 1 tablet by mouth 2 (two) times daily.     Marland Kitchen ENTRESTO 49-51 MG TAKE 1 TABLET BY MOUTH TWICE A DAY 60 tablet 10  . Fluticasone-Salmeterol (ADVAIR) 250-50 MCG/DOSE AEPB Inhale 1 puff into the lungs 2 (two) times daily.    . furosemide (LASIX) 40 MG tablet Take 40 mg by mouth daily.    Marland Kitchen HYDROcodone-acetaminophen (NORCO/VICODIN) 5-325 MG per tablet Take 1 tablet by mouth every 6 (six) hours as needed  for moderate pain.     30 tablet 0  . Multiple Vitamin (MULTIVITAMIN) tablet Take 1 tablet by mouth daily.    . NON FORMULARY 1 each by Other route See admin instructions. Use CPAP machine nightly.    Marland Kitchen omeprazole (PRILOSEC) 20 MG capsule Take 20 mg by mouth 2 (two) times daily.    . traZODone (DESYREL) 100 MG tablet Take 1-2 tablets (100-200 mg total) by mouth at bedtime as needed. 180 tablet 1   No facility-administered medications prior to visit.    PAST MEDICAL HISTORY: Past Medical History:  Diagnosis Date  . Abscess of anal and rectal regions   . Acute renal insufficiency   . Asthma   . Chest pain   . Diabetes mellitus    "pre diabetic"- no meds  . DOE (dyspnea on exertion)   . Hemorrhoids, internal   . Hyperlipidemia   . Hypertension   . Sleep apnea     PAST SURGICAL HISTORY: Past Surgical History:  Procedure Laterality Date  . COLONOSCOPY    . INCISION AND DRAINAGE PERIRECTAL ABSCESS  06/09/11  . LEFT HEART CATH AND CORONARY ANGIOGRAPHY N/A 08/06/2017   Procedure: LEFT HEART CATH AND CORONARY ANGIOGRAPHY;  Surgeon: Burnell Blanks, MD;  Location: [facility];  Service: Cardiovascular;  Laterality: N/A;  . POLYPECTOMY    . TONSILLECTOMY    . ULNAR COLLATERAL LIGAMENT RECONSTRUCTION  2003   left hand    FAMILY HISTORY: Family History  Problem Relation Age of Onset  . Diabetes Father   . Heart disease Father   . Diabetes Mother   . Heart disease Paternal Grandmother   . Colon cancer Neg Hx   . Esophageal cancer Neg Hx   . Rectal cancer Neg Hx   . Stomach cancer Neg Hx     SOCIAL HISTORY: Social History   Socioeconomic History  . Marital status: Married    Spouse name: Not on file  . Number of children: 3  . Years of education: Not on file  . Highest education level: Not on file  Occupational History  . Not on file  Tobacco Use  . Smoking status: Former Smoker    Types: Cigarettes    Quit date: 12/15/1995    Years since quitting:  23.7  . Smokeless tobacco: Never Used  Substance and Sexual Activity  . Alcohol use: Yes    Alcohol/week: 2.0 standard drinks    Types: 2 Standard drinks or equivalent per week    Comment: Occasional      . Drug use: No  . Sexual activity: Not on file  Other Topics Concern  . Not on file  Social History Narrative  . Not on file   Social Determinants of Health   Financial Resource Strain:   . Difficulty of Paying Living Expenses: Not on file  Food Insecurity:   .  Worried About Charity fundraiser in the Last Year: Not on file  . Ran Out of Food in the Last Year: Not on file  Transportation Needs:   . Lack of Transportation (Medical): Not on file  . Lack of Transportation (Non-Medical): Not on file  Physical Activity:   . Days of Exercise per Week: Not on file  . Minutes of Exercise per Session: Not on file  Stress:   . Feeling of Stress : Not on file  Social Connections:   . Frequency of Communication with Friends and Family: Not on file  . Frequency of Social Gatherings with Friends and Family: Not on file  . Attends Religious Services: Not on file  . Active Member of Clubs or Organizations: Not on file  . Attends Archivist Meetings: Not on file  . Marital Status: Not on file  Intimate Partner Violence:   . Fear of Current or Ex-Partner: Not on file  . Emotionally Abused: Not on file  . Physically Abused: Not on file  . Sexually Abused: Not on file      PHYSICAL EXAM Generalized: Well developed, in no acute distress   Neurological examination  Mentation: Alert oriented to time, place, history taking. Follows all commands speech and language fluent Cranial nerve II-XII:Extraocular movements were full. Facial symmetry noted.  Head turning and shoulder shrug  were normal and symmetric. Motor: Good strength throughout subjectively per patient Sensory: Sensory testing is intact to soft touch on all 4 extremities subjectively per patient Coordination:  Cerebellar testing reveals good finger-nose-finger  Gait and station: Patient is able to stand from a seated position. gait is normal.  Reflexes: UTA  DIAGNOSTIC DATA (LABS, IMAGING, TESTING) - I reviewed patient records, labs, notes, testing and imaging myself where available.  Lab Results  Component Value Date   WBC 9.8 08/02/2017   HGB 14.1 08/02/2017   HCT 42.0 08/02/2017   MCV 80 08/02/2017   PLT 199 08/02/2017      Component Value Date/Time   NA 139 11/18/2018 1535   K 4.5 11/18/2018 1535   CL 101 11/18/2018 1535   CO2 22 11/18/2018 1535   GLUCOSE 122 (H) 11/18/2018 1535   GLUCOSE 121 (H) 11/15/2014 0613   BUN 8 11/18/2018 1535   CREATININE 1.14 11/18/2018 1535   CALCIUM 9.4 11/18/2018 1535   PROT 8.3 11/14/2014 1019   ALBUMIN 3.8 11/14/2014 1019   AST 44 (H) 11/14/2014 1019   ALT 52 11/14/2014 1019   ALKPHOS 78 11/14/2014 1019   BILITOT QUANTITY NOT SUFFICIENT, UNABLE TO PERFORM TEST 11/14/2014 1019   GFRNONAA 72 11/18/2018 1535   GFRAA 83 11/18/2018 1535   No results found for: CHOL, HDL, LDLCALC, LDLDIRECT, TRIG, CHOLHDL Lab Results  Component Value Date   HGBA1C 6.9 (H) 11/13/2014   No results found for: VITAMINB12 Lab Results  Component Value Date   TSH 1.450 08/02/2017      ASSESSMENT AND PLAN 59 y.o. year old male  has a past medical history of Abscess of anal and rectal regions, Acute renal insufficiency, Asthma, Chest pain, Diabetes mellitus, DOE (dyspnea on exertion), Hemorrhoids, internal, Hyperlipidemia, Hypertension, and Sleep apnea. here with:  1. Obstructive sleep apnea on CPAP  The patient's CPAP download shows excellent compliance and good treatment of his apnea.  He is encouraged to continue using CPAP nightly and greater than 4 hours each night.  He is advised that if his symptoms worsen or he develops new symptoms he should let  us know.  He will follow-up in 1 year or sooner if needed    I spent 15 minutes with the patient. 50% of  this time was spent reviewing CPAP download   Michael Givens, MSN, NP-C 09/14/2019, 1:35 PM [facility] [address], [place], [place] [address] [phone]  I reviewed the above note and documentation by the Nurse Practitioner and agree with the history, exam, assessment and plan as outlined above. I was available for consultation. Star Age, MD, PhD [facility])

## 2019-10-12 ENCOUNTER — Encounter: Payer: Self-pay | Admitting: Physician Assistant

## 2019-10-12 ENCOUNTER — Ambulatory Visit (INDEPENDENT_AMBULATORY_CARE_PROVIDER_SITE_OTHER): Payer: Medicare Other | Admitting: Physician Assistant

## 2019-10-12 DIAGNOSIS — G4700 Insomnia, unspecified: Secondary | ICD-10-CM | POA: Diagnosis not present

## 2019-10-12 DIAGNOSIS — F3341 Major depressive disorder, recurrent, in partial remission: Secondary | ICD-10-CM

## 2019-10-12 DIAGNOSIS — F411 Generalized anxiety disorder: Secondary | ICD-10-CM | POA: Diagnosis not present

## 2019-10-12 DIAGNOSIS — F4321 Adjustment disorder with depressed mood: Secondary | ICD-10-CM

## 2019-10-12 MED ORDER — DULOXETINE HCL 30 MG PO CPEP: 30.0000 mg | ORAL_CAPSULE | Freq: Two times a day (BID) | ORAL | 5 refills | Status: DC

## 2019-10-12 MED ORDER — TRAZODONE HCL 100 MG PO TABS: 100.0000 mg | ORAL_TABLET | Freq: Every evening | ORAL | 5 refills | Status: DC | PRN

## 2019-10-12 MED ORDER — ALPRAZOLAM 0.5 MG PO TABS: 0.5000 mg | ORAL_TABLET | Freq: Two times a day (BID) | ORAL | 5 refills | Status: DC | PRN

## 2019-10-12 NOTE — Progress Notes (Signed)
[facility] Med Check  Patient ID: [name],  MRN: [id number]  PCP: Shon Baton, MD  Date of Evaluation: 10/12/2019 Time spent:20 minutes  Chief Complaint:  Chief Complaint    Anxiety; Depression; Medication Refill     Virtual Visit via Video Note  I connected with [name] on 10/12/19 at 11:00 AM EST by a video enabled telemedicine application and verified that I am speaking with the correct person using two identifiers.  Unable to connect via video so visit was done over the phone.  Location: Patient: home Provider:  office   I discussed the limitations of evaluation and management by telemedicine and the availability of in person appointments. The patient expressed understanding and agreed to proceed.   I provided 20 minutes of non-face-to-face time during this encounter.  Donnal Moat, PA-C  HISTORY/CURRENT STATUS: HPI for routine med check.  Over the past year, [name] has had a lot of changes.  Unfortunately, his older brother died from [name] last 12/30/22.  His mother and another brother also had it but survived.  His daughter that he has relationship issues with, had a stillborn baby.  States it is just been really tough but he is hanging in there.    He feels that his psychiatric medications are helping him.  Reports not having to use the Xanax except rarely.  He is able to work through his anxiety without using medications most of the time.  "I do like to have it as a safety net but do not need it much."  He sleeps well usually only taking the trazodone 50 mg, sometimes 100 mg.  He feels rested when he gets up the next day.  He is able to enjoy things.  Energy and motivation are good for the most part.  Due to physical limitations including congestive heart failure, obesity, and bilateral knee problems he is not able to do a lot of physical things that requires a lot of energy.  Focus and memory are good.  He denies suicidal or homicidal  thoughts.  Patient denies increased energy with decreased need for sleep, no increased talkativeness, no racing thoughts, no impulsivity or risky behaviors, no increased spending, no increased libido, no grandiosity.  Denies muscle or joint pain, stiffness, or dystonia.Denies dizziness, syncope, seizures, numbness, tingling, tremor, tics, unsteady gait, slurred speech, confusion.   Individual Medical History/ Review of Systems: Changes? :No    Past medications for mental health diagnoses include: None   Allergies: Shellfish allergy  Current Medications:  Current Outpatient Medications:  .  acetaminophen (TYLENOL) 325 MG tablet, Take 2 tablets (650 mg total) by mouth every 6 (six) hours as needed for mild pain (or Fever >/= 101)., Disp: , Rfl:  .  albuterol (PROVENTIL HFA;VENTOLIN HFA) 108 (90 BASE) MCG/ACT inhaler, Inhale 2 puffs into the lungs every 4 (four) hours as needed for wheezing or shortness of breath., Disp: , Rfl:  .  ALPRAZolam (XANAX) 0.5 MG tablet, Take 1 tablet (0.5 mg total) by mouth 2 (two) times daily as needed for anxiety., Disp: 30 tablet, Rfl: 5 .  aspirin 81 MG chewable tablet, Chew 1 tablet (81 mg total) by mouth daily., Disp: , Rfl:  .  atorvastatin (LIPITOR) 10 MG tablet, Take 10 mg by mouth daily., Disp: , Rfl:  .  carvedilol (COREG) 6.25 MG tablet, TAKE 1 TABLET BY MOUTH TWICE A DAY, Disp: 180 tablet, Rfl: 3 .  Dulaglutide (TRULICITY [place]), Inject into the skin., Disp: , Rfl:  .  DULoxetine (CYMBALTA) 30 MG capsule, Take 1 capsule (30 mg total) by mouth 2 (two) times daily., Disp: 60 capsule, Rfl: 5 .  Empagliflozin (JARDIANCE PO), Take by mouth., Disp: , Rfl:  .  ENTRESTO 49-51 MG, TAKE 1 TABLET BY MOUTH TWICE A DAY, Disp: 60 tablet, Rfl: 10 .  Fluticasone-Salmeterol (ADVAIR) 250-50 MCG/DOSE AEPB, Inhale 1 puff into the lungs 2 (two) times daily., Disp: , Rfl:  .  furosemide (LASIX) 40 MG tablet, Take 40 mg by mouth daily., Disp: , Rfl:  .  Multiple Vitamin  (MULTIVITAMIN) tablet, Take 1 tablet by mouth daily., Disp: , Rfl:  .  NON FORMULARY, 1 each by Other route See admin instructions. Use CPAP machine nightly., Disp: , Rfl:  .  omeprazole (PRILOSEC) 20 MG capsule, Take 20 mg by mouth 2 (two) times daily., Disp: , Rfl:  .  traZODone (DESYREL) 100 MG tablet, Take 1-2 tablets (100-200 mg total) by mouth at bedtime as needed., Disp: 60 tablet, Rfl: 5 .  HYDROcodone-acetaminophen (NORCO/VICODIN) 5-325 MG per tablet, Take 1 tablet by mouth every 6 (six) hours as needed for moderate pain.     (Patient not taking: Reported on 10/12/2019), Disp: 30 tablet, Rfl: 0 Medication Side Effects: none  Family Medical/ Social History: Changes? Yes Brother died from covid.  Another brother had it and his mother had it too and they recovered.  His daughter had a stillborn.  'It's been hard.  But I'm hanging in there.'  MENTAL HEALTH EXAM:  There were no vitals taken for this visit.There is no height or weight on file to calculate BMI.  General Appearance: unable to assess  Eye Contact:  unable to assess  Speech:  Clear and Coherent  Volume:  Normal  Mood:  sad  Affect:  unable to assess  Thought Process:  Goal Directed  Orientation:  Full (Time, Place, and Person)  Thought Content: Logical   Suicidal Thoughts:  No  Homicidal Thoughts:  No  Memory:  WNL  Judgement:  Good  Insight:  Good  Psychomotor Activity:  unable to assess  Concentration:  Concentration: Good  Recall:  Good  Fund of Knowledge: Good  Language: Good  Assets:  Desire for Improvement  ADL's:  Intact  Cognition: WNL  Prognosis:  Good    DIAGNOSES:    ICD-10-CM   1. Recurrent major depressive disorder, in partial remission ([place])  F33.41   2. Insomnia, unspecified type  G47.00   3. Generalized anxiety disorder  F41.1   4. Grief  F43.21     Receiving Psychotherapy: Yes    RECOMMENDATIONS:  PDMP was reviewed. My condolences for the loss of his brother and grandchild. Continue  Cymbalta 30 mg 1 p.o. twice daily. Continue trazodone 100 mg, 1-2 nightly as needed.  Of course continue to take the lowest dose that is effective. Continue Xanax 0.5 mg, 1 p.o. twice daily as needed. Continue therapy with Rinaldo Cloud, LCSW. Return in 6 months.  Donnal Moat, PA-C

## 2019-11-07 ENCOUNTER — Telehealth: Payer: Self-pay | Admitting: Physician Assistant

## 2019-11-07 ENCOUNTER — Other Ambulatory Visit: Payer: Self-pay

## 2019-11-07 MED ORDER — ALPRAZOLAM 0.5 MG PO TABS: 0.5000 mg | ORAL_TABLET | Freq: Two times a day (BID) | ORAL | 5 refills | Status: DC | PRN

## 2019-11-07 NOTE — Telephone Encounter (Signed)
RX from 10/12/2019 resubmitted

## 2019-11-07 NOTE — Telephone Encounter (Signed)
Pharmacy stated never received Rx for Xanax on 10/12/19 @ [facility]. Please resend. They did get Trazodone, not Xanax. Pt is out

## 2019-11-11 ENCOUNTER — Other Ambulatory Visit: Payer: Self-pay | Admitting: Physician Assistant

## 2019-12-01 DIAGNOSIS — E1122 Type 2 diabetes mellitus with diabetic chronic kidney disease: Secondary | ICD-10-CM | POA: Diagnosis not present

## 2019-12-01 DIAGNOSIS — N182 Chronic kidney disease, stage 2 (mild): Secondary | ICD-10-CM | POA: Diagnosis not present

## 2019-12-01 DIAGNOSIS — F325 Major depressive disorder, single episode, in full remission: Secondary | ICD-10-CM | POA: Diagnosis not present

## 2019-12-01 DIAGNOSIS — I130 Hypertensive heart and chronic kidney disease with heart failure and stage 1 through stage 4 chronic kidney disease, or unspecified chronic kidney disease: Secondary | ICD-10-CM | POA: Diagnosis not present

## 2019-12-01 DIAGNOSIS — F1011 Alcohol abuse, in remission: Secondary | ICD-10-CM | POA: Diagnosis not present

## 2019-12-01 DIAGNOSIS — I5042 Chronic combined systolic (congestive) and diastolic (congestive) heart failure: Secondary | ICD-10-CM | POA: Diagnosis not present

## 2019-12-01 DIAGNOSIS — G4733 Obstructive sleep apnea (adult) (pediatric): Secondary | ICD-10-CM | POA: Diagnosis not present

## 2019-12-06 DIAGNOSIS — E1122 Type 2 diabetes mellitus with diabetic chronic kidney disease: Secondary | ICD-10-CM | POA: Diagnosis not present

## 2020-02-02 ENCOUNTER — Other Ambulatory Visit: Payer: Self-pay | Admitting: Cardiology

## 2020-02-02 DIAGNOSIS — I509 Heart failure, unspecified: Secondary | ICD-10-CM

## 2020-02-02 DIAGNOSIS — I10 Essential (primary) hypertension: Secondary | ICD-10-CM

## 2020-02-02 DIAGNOSIS — E7800 Pure hypercholesterolemia, unspecified: Secondary | ICD-10-CM

## 2020-02-02 DIAGNOSIS — I420 Dilated cardiomyopathy: Secondary | ICD-10-CM

## 2020-02-23 ENCOUNTER — Other Ambulatory Visit: Payer: Self-pay | Admitting: Cardiology

## 2020-02-23 DIAGNOSIS — E7800 Pure hypercholesterolemia, unspecified: Secondary | ICD-10-CM

## 2020-02-23 DIAGNOSIS — I420 Dilated cardiomyopathy: Secondary | ICD-10-CM

## 2020-02-23 DIAGNOSIS — I509 Heart failure, unspecified: Secondary | ICD-10-CM

## 2020-02-23 DIAGNOSIS — I10 Essential (primary) hypertension: Secondary | ICD-10-CM

## 2020-03-07 ENCOUNTER — Other Ambulatory Visit: Payer: Self-pay | Admitting: Cardiology

## 2020-03-21 DIAGNOSIS — S46012D Strain of muscle(s) and tendon(s) of the rotator cuff of left shoulder, subsequent encounter: Secondary | ICD-10-CM | POA: Diagnosis not present

## 2020-05-21 ENCOUNTER — Ambulatory Visit (INDEPENDENT_AMBULATORY_CARE_PROVIDER_SITE_OTHER): Payer: Medicare Other | Admitting: Psychiatry

## 2020-05-21 DIAGNOSIS — F411 Generalized anxiety disorder: Secondary | ICD-10-CM

## 2020-05-21 NOTE — Progress Notes (Signed)
[facility] Counselor/Therapist Progress Note  Patient ID: [name], MRN: [id number],    Date: 05/21/2020  Time Spent: 60 minutes   9:00am to 10:00am   Virtual Visit Note via [place] with patient by a video enabled telemedicine/telehealth application or telephone, with their informed consent, and verified patient privacy and that I am speaking with the correct person using two identifiers. I discussed the limitations, risks, security and privacy concerns of performing psychotherapy and management service by telephone and the availability of in person appointments. I also discussed with the patient that there may be a patient responsible charge related to this service. The patient expressed understanding and agreed to proceed. I discussed the treatment planning with the patient. The patient was provided an opportunity to ask questions and all were answered. The patient agreed with the plan and demonstrated an understanding of the instructions. The patient was advised to call  our office if  symptoms worsen or feel they are in a crisis state and need immediate contact.   Therapist Location: [facility] Patient Location: home   Treatment Type: Individual Therapy  Reported Symptoms: anxiety, some sleep difficulty being addressed with his med provider, some depression re: family stressors  Mental Status Exam:  Appearance:   Casual     Behavior:  Appropriate, Sharing and Motivated  Motor:  Normal  Speech/Language:   Clear and Coherent  Affect:  anxious, stressed  Mood:  anxious and some depression  Thought process:  goal directed  Thought content:    WNL  Sensory/Perceptual disturbances:    WNL  Orientation:  oriented to person, place, time/date, situation, day of week, month of year and year  Attention:  Good  Concentration:  Good and Fair  Memory:  WNL  Fund of knowledge:   Good  Insight:    Good  Judgment:   Good  Impulse Control:  Good    Risk Assessment: Danger to Self:  No Self-injurious Behavior: No Danger to Others: No Duty to Warn:no Physical Aggression / Violence:No  Access to Firearms a concern: No  Gang Involvement:No   Subjective: Patient today reports physical problems continue and "they add to my stress."  Another "big stress is with one of his adult daughters" and "that is ongoing right now."   Interventions: Solution-Oriented/Positive Psychology and Ego-Supportive  Diagnosis:   ICD-10-CM   1. Generalized anxiety disorder  F41.1      Plan: Patient not signing tx plan on computer screen due to [place].  Treatment Goals: Goals may remain on the tx plan as patient works on strategies to achieve goals. Progress or lack of progress will be documented each session in "Progress" section on Plan.  Long term goal: Develop the ability to recognize, accept, and cope with feelings of depression.  Short term goal: Identify and replace depressive thinking that leads to depressive feelings and actions.  Strategy: Educate patient about cognitive restructuring, including self-monitoring of automatic thoughts, and working to interrupt his depressive thought patterns and replace with cognitive patterns that do not support depression.  Progress: Patient today reports anxiety being his "main symptom", along with some depression and some sleep issues (reports using Traxedone with benefit).   Denies any SI.  Having lots of concerns and conflicts with one of his adult daughters again.  Things reportedly were somewhat better several months ago but now "its' gone backwards".  Feels supported by other adult kids and some of their friends, and also supported by his faith.  Is remaining on his meds as prescribed by Donnal Moat, PA-C. Patient was very open today in processing his thoughts and feelings about his health concerns and the troubling situation with daughter and husband. Was very grateful to have the time to  openly share.  Will also work on trying to interrupt some of his anxious/depressive thoughts, and replace them to be more positive, reality-based, and encouraging (per review of tx plan above). He is to call back for next appt as he needs to coordinate schedule with other appts and wife's appts.  Goal review and progress/challenges noted with patient.  Next appt within 3-4 weeks.   Shanon Ace, LCSW

## 2020-06-20 ENCOUNTER — Encounter: Payer: Self-pay | Admitting: Physician Assistant

## 2020-06-20 ENCOUNTER — Telehealth: Payer: Self-pay | Admitting: Physician Assistant

## 2020-06-20 ENCOUNTER — Telehealth (INDEPENDENT_AMBULATORY_CARE_PROVIDER_SITE_OTHER): Payer: Medicare Other | Admitting: Physician Assistant

## 2020-06-20 DIAGNOSIS — G4733 Obstructive sleep apnea (adult) (pediatric): Secondary | ICD-10-CM

## 2020-06-20 DIAGNOSIS — G4700 Insomnia, unspecified: Secondary | ICD-10-CM

## 2020-06-20 DIAGNOSIS — F3341 Major depressive disorder, recurrent, in partial remission: Secondary | ICD-10-CM

## 2020-06-20 DIAGNOSIS — F411 Generalized anxiety disorder: Secondary | ICD-10-CM

## 2020-06-20 MED ORDER — DULOXETINE HCL 30 MG PO CPEP: 30.0000 mg | ORAL_CAPSULE | Freq: Two times a day (BID) | ORAL | 1 refills | Status: DC

## 2020-06-20 MED ORDER — TRAZODONE HCL 100 MG PO TABS: 100.0000 mg | ORAL_TABLET | Freq: Every evening | ORAL | 1 refills | Status: DC | PRN

## 2020-06-20 MED ORDER — ALPRAZOLAM 0.5 MG PO TABS: 0.5000 mg | ORAL_TABLET | Freq: Two times a day (BID) | ORAL | 5 refills | Status: DC | PRN

## 2020-06-20 NOTE — Telephone Encounter (Signed)
[name], challis are scheduled for a virtual visit with your provider today.    Just as we do with appointments in the office, we must obtain your consent to participate.  Your consent will be active for this visit and any virtual visit you may have with one of our providers in the next 365 days.    If you have a MyChart account, I can also send a copy of this consent to you electronically.  All virtual visits are billed to your insurance company just like a traditional visit in the office.  As this is a virtual visit, video technology does not allow for your provider to perform a traditional examination.  This may limit your provider's ability to fully assess your condition.  If your provider identifies any concerns that need to be evaluated in person or the need to arrange testing such as labs, EKG, etc, we will make arrangements to do so.    Although advances in technology are sophisticated, we cannot ensure that it will always work on either your end or our end.  If the connection with a video visit is poor, we may have to switch to a telephone visit.  With either a video or telephone visit, we are not always able to ensure that we have a secure connection.   I need to obtain your verbal consent now.   Are you willing to proceed with your visit today?   [name] has provided verbal consent on 06/20/2020 for a virtual visit (video or telephone).   Donnal Moat, PA-C 06/20/2020  12:16 PM

## 2020-06-20 NOTE — Progress Notes (Signed)
[name]  Patient ID: HOA DERISO,  MRN: [id number]  PCP: Shon Baton, MD  Date of Evaluation: 06/20/2020 Time spent:20 minutes  Chief Complaint:  Chief Complaint    Anxiety; Depression; Insomnia     Virtual Visit via Telehealth  I connected with patient by a video enabled telemedicine application with their informed consent, and verified patient privacy and that I am speaking with the correct person using two identifiers.  I am private, in my office and the patient is at home.  I discussed the limitations, risks, security and privacy concerns of performing an evaluation and management service by video and the availability of in person appointments. I also discussed with the patient that there may be a patient responsible charge related to this service. The patient expressed understanding and agreed to proceed.   I discussed the assessment and treatment plan with the patient. The patient was provided an opportunity to ask questions and all were answered. The patient agreed with the plan and demonstrated an understanding of the instructions.   The patient was advised to call back or seek an in-person evaluation if the symptoms worsen or if the condition fails to improve as anticipated.  I provided 20 minutes of non-face-to-face time during this encounter.  HISTORY/CURRENT STATUS: HPI for routine med [name].  Sten is doing well overall.  He continues to have stressors in his family, particularly with his daughter who blames him and his wife for her having a stillborn child last year.  He has been discussing that with [name] in counseling.  That has been difficult to say the least but he is working through things.  He is able to enjoy things.  Energy and motivation are good for the most part.  He does have a lot of physical limitations due to diabetes, CHF, obesity.  Not isolating.  Not crying easily.  He denies suicidal or homicidal thoughts.  Due to some of  the stressors, he does get anxious at times, especially with racing thoughts.  He does not use the Xanax every day but it is helpful when he needs it.  He does take the trazodone almost every night.  He tries to take the lowest dose possible but sometimes he does have to take 150 mg.  Patient denies increased energy with decreased need for sleep, no increased talkativeness, no racing thoughts, no impulsivity or risky behaviors, no increased spending, no increased libido, no grandiosity.  Denies muscle or joint pain, stiffness, or dystonia.Denies dizziness, syncope, seizures, numbness, tingling, tremor, tics, unsteady gait, slurred speech, confusion.   Individual Medical History/ Review of Systems: Changes? :No    Past medications for mental health diagnoses include: None   Allergies: Shellfish allergy  Current Medications:  Current Outpatient Medications:  .  acetaminophen (TYLENOL) 325 MG tablet, Take 2 tablets (650 mg total) by mouth every 6 (six) hours as needed for mild pain (or Fever >/= 101)., Disp: , Rfl:  .  albuterol (PROVENTIL HFA;VENTOLIN HFA) 108 (90 BASE) MCG/ACT inhaler, Inhale 2 puffs into the lungs every 4 (four) hours as needed for wheezing or shortness of breath., Disp: , Rfl:  .  ALPRAZolam (XANAX) 0.5 MG tablet, Take 1 tablet (0.5 mg total) by mouth 2 (two) times daily as needed for anxiety., Disp: 30 tablet, Rfl: 5 .  aspirin 81 MG chewable tablet, Chew 1 tablet (81 mg total) by mouth daily., Disp: , Rfl:  .  atorvastatin (LIPITOR) 10 MG tablet, Take 10 mg by mouth  daily., Disp: , Rfl:  .  carvedilol (COREG) 6.25 MG tablet, TAKE 1 TABLET BY MOUTH TWICE A DAY, Disp: 180 tablet, Rfl: 1 .  Dulaglutide (TRULICITY [place]), Inject into the skin., Disp: , Rfl:  .  DULoxetine (CYMBALTA) 30 MG capsule, Take 1 capsule (30 mg total) by mouth 2 (two) times daily., Disp: 180 capsule, Rfl: 1 .  Empagliflozin (JARDIANCE PO), Take by mouth., Disp: , Rfl:  .  ENTRESTO 49-51 MG, TAKE 1 TABLET  BY MOUTH TWICE A DAY, Disp: 60 tablet, Rfl: 6 .  Fluticasone-Salmeterol (ADVAIR) 250-50 MCG/DOSE AEPB, Inhale 1 puff into the lungs 2 (two) times daily., Disp: , Rfl:  .  furosemide (LASIX) 40 MG tablet, Take 40 mg by mouth daily., Disp: , Rfl:  .  Multiple Vitamin (MULTIVITAMIN) tablet, Take 1 tablet by mouth daily., Disp: , Rfl:  .  NON FORMULARY, 1 each by Other route See admin instructions. Use CPAP machine nightly., Disp: , Rfl:  .  omeprazole (PRILOSEC) 20 MG capsule, Take 20 mg by mouth 2 (two) times daily., Disp: , Rfl:  .  traZODone (DESYREL) 100 MG tablet, Take 1-2 tablets (100-200 mg total) by mouth at bedtime as needed., Disp: 180 tablet, Rfl: 1 .  HYDROcodone-acetaminophen (NORCO/VICODIN) 5-325 MG per tablet, Take 1 tablet by mouth every 6 (six) hours as needed for moderate pain.     (Patient not taking: Reported on 10/12/2019), Disp: 30 tablet, Rfl: 0 Medication Side Effects: none  Family Medical/ Social History: Changes?  He has been unable to travel to [place] to see family since before [place] hit in 17-Dec-2018.  He had 1 brother that passed away in 12/17/2018 due to [place].  MENTAL HEALTH EXAM:  There were no vitals taken for this visit.There is no height or weight on file to calculate BMI.  General Appearance: Casual, Neat, Well Groomed and Obese  Eye Contact:  Good  Speech:  Clear and Coherent  Volume:  Normal  Mood:  Euthymic  Affect:  Appropriate  Thought Process:  Goal Directed  Orientation:  Full (Time, Place, and Person)  Thought Content: Logical   Suicidal Thoughts:  No  Homicidal Thoughts:  No  Memory:  WNL  Judgement:  Good  Insight:  Good  Psychomotor Activity:  Normal  Concentration:  Concentration: Good  Recall:  Good  Fund of Knowledge: Good  Language: Good  Assets:  Desire for Improvement  ADL's:  Intact  Cognition: WNL  Prognosis:  Good    DIAGNOSES:    ICD-10-CM   1. Recurrent major depressive disorder, in partial remission ([place])  F33.41    2. Insomnia, unspecified type  G47.00   3. Generalized anxiety disorder  F41.1   4. Obstructive sleep apnea  G47.33     Receiving Psychotherapy: Yes With Michael Cloud, LCSW.   RECOMMENDATIONS:  PDMP was reviewed. I provided 20 minutes of nonface-to-face time during this encounter. I am glad to see him doing well overall. Continue Cymbalta 30 mg 1 p.o. twice daily. Continue trazodone 100 mg, 1-2 nightly as needed.  Of course continue to take the lowest dose that is effective, but I reminded him that it is safe to take up to 200 mg.  I do not want him to lose sleep just because he is trying to keep the dose low. Continue Xanax 0.5 mg, 1 p.o. twice daily as needed. Continue use of CPAP. Continue therapy with Michael Cloud, LCSW. Return in 6 months.  Donnal Moat, PA-C

## 2020-07-13 DIAGNOSIS — M1712 Unilateral primary osteoarthritis, left knee: Secondary | ICD-10-CM | POA: Diagnosis not present

## 2020-07-13 DIAGNOSIS — M1711 Unilateral primary osteoarthritis, right knee: Secondary | ICD-10-CM | POA: Diagnosis not present

## 2020-07-18 ENCOUNTER — Ambulatory Visit: Payer: Medicare Other | Admitting: Family Medicine

## 2020-07-21 ENCOUNTER — Other Ambulatory Visit: Payer: Self-pay | Admitting: Cardiology

## 2020-07-21 DIAGNOSIS — I10 Essential (primary) hypertension: Secondary | ICD-10-CM

## 2020-07-21 DIAGNOSIS — E7800 Pure hypercholesterolemia, unspecified: Secondary | ICD-10-CM

## 2020-07-21 DIAGNOSIS — I420 Dilated cardiomyopathy: Secondary | ICD-10-CM

## 2020-07-21 DIAGNOSIS — I509 Heart failure, unspecified: Secondary | ICD-10-CM

## 2020-08-21 ENCOUNTER — Ambulatory Visit (INDEPENDENT_AMBULATORY_CARE_PROVIDER_SITE_OTHER): Payer: Medicare Other | Admitting: Psychiatry

## 2020-08-21 DIAGNOSIS — F411 Generalized anxiety disorder: Secondary | ICD-10-CM | POA: Diagnosis not present

## 2020-08-21 NOTE — Progress Notes (Signed)
[facility] Counselor/Therapist Progress Note  Patient ID: [name], MRN: [id number],    Date: 08/21/2020  Time Spent: 50 minutes     7:55a.m. to 8:45am   Virtual Visit Note via [place] with patient by a video enabled telemedicine/telehealth application or telephone, with their informed consent, and verified patient privacy and that I am speaking with the correct person using two identifiers. I discussed the limitations, risks, security and privacy concerns of performing psychotherapy and management service by telephone and the availability of in person appointments. I also discussed with the patient that there may be a patient responsible charge related to this service. The patient expressed understanding and agreed to proceed. I discussed the treatment planning with the patient. The patient was provided an opportunity to ask questions and all were answered. The patient agreed with the plan and demonstrated an understanding of the instructions. The patient was advised to call  our office if  symptoms worsen or feel they are in a crisis state and need immediate contact.   Therapist Location: [facility] Patient Location: home   Treatment Type: Individual Therapy  Reported Symptoms:  Anxiety, depression  Mental Status Exam:  Appearance:   Casual     Behavior:  Appropriate, Sharing and Motivated  Motor:  difficulty walking due to "bad knees"; uses cane and 2 knee braces  Speech/Language:   Clear and Coherent  Affect:  anxious, some depression  Mood:  anxious and depressed  Thought process:  goal directed  Thought content:    WNL  Sensory/Perceptual disturbances:    WNL  Orientation:  oriented to person, place, time/date, situation, day of week, month of year and year  Attention:  Good  Concentration:  Good  Memory:  WNL  Fund of knowledge:   Good  Insight:    Good  Judgment:   Good  Impulse Control:  Good   Risk Assessment: Danger to  Self:  No Self-injurious Behavior: No Danger to Others: No Duty to Warn:no Physical Aggression / Violence:No  Access to Firearms a concern: No  Gang Involvement:No   Subjective: Patient today reports anxiety is main symptom, and also depression.  Lots of family issues still going on involving adult daughter and grandson.  Also difficulty with loss of brother tends to come up at holiday time. Physical health is a concern with weight, "bad knees", and a shoulder issue for which he is being treated.   Interventions: Solution-Oriented/Positive Psychology and Ego-Supportive  Diagnosis:   ICD-10-CM   1. Generalized anxiety disorder  F41.1     Plan: Patient not signing tx plan on computer screen due to [place].  Treatment Goals: Goals may remain on the tx plan as patient works on strategies to achieve goals. Progress or lack of progress will be documented each session in "Progress" section on Plan.  Long term goal: Develop the ability to recognize, accept, and cope with feelings of depression.  Short term goal: Identify and replace depressive thinking that leads to depressive feelings and actions.  Strategy: Educate patient about cognitive restructuring, including self-monitoring of automatic thoughts, and working to interrupt his depressive thought patterns and replace with cognitive patterns that do not support depression.  Progress: Patient today reporting anxiety and depression, with anxiety being the stronger symptom.  Denies any SI.  Needed the session today to process a lot of his anxiety, depression, and discouragement related to family issues, grief of family losses that tends to resurface at holiday times, and  his own health concerns including overweight, severe arthritic knees, and shoulder pain.  States that he gets injection in shoulder that helps about 5-6 months, injection in knees that helps about 3 months, and his attempts to lose weight has been unsuccessful.  He  states that he can "back away from the kitchen table but cannot exercise and he feels that is the main issue with not being able to lose weight."  Understandably with his weight and other health issues, surgery for knee replacements is considered risky for this patient.  Expresses a lot of sadness and concern about a daughter who still will not have any contact at all with patient and his wife nor his other 2 daughters.  The other 2 daughters are in contact with patient and wife as well as a grandchild of one of the daughters.  Patient states that he is able to find some joy with those 2 daughters.  Processes at length a lot of mixed feelings of sadness and some fears.  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  Also in contact with his church virtually with [place] services.  Was very appreciative for the contact today to be able to process his concerns about these sensitive situations stated above.  Will schedule within the next few weeks at his request, coordinating with  the timing of ther med appts for his health concerns. Encouraged his continued contact with others who are supportive of him, looking for more positives than negatives each day, using his faith to help support him emotionally, continuing to be very safe and COVID precautions as he is not vaccinated, move about as he is able to get a little exercise, continue trying to eat healthier, focus on things he can control versus cannot control, and using more positive self talk.  Was smiling more and seemed less anxious, and some less stressed by session end.  Goal review and progress/challenges noted with patient.  Next appt within 3-4 weeks.   Shanon Ace, LCSW

## 2020-09-05 NOTE — Progress Notes (Signed)
Virtual Visit via Video Note changed to phone visit at patient request   This visit type was conducted due to national recommendations for restrictions regarding the COVID-19 Pandemic (e.g. social distancing) in an effort to limit this patient's exposure and mitigate transmission in our community.  Due to his co-morbid illnesses, this patient is at least at moderate risk for complications without adequate follow up.  This format is felt to be most appropriate for this patient at this time.  All issues noted in this document were discussed and addressed.  A limited physical exam was performed with this format.  Please refer to the patient's chart for his consent to telehealth for [facility].  Date:  09/13/2020   ID:  [place], DOB 29-Sep-1960, MRN [id number]  Patient Location:Home Provider Location: Home  PCP:  Shon Baton, MD  Cardiologist:  Dr Stanford Breed  Evaluation Performed:  Follow-Up Visit  Chief Complaint:  FU CM and hypertension  History of Present Illness:    FUcardiomyopathy andhypertension. Echocardiogram 11/18 showed [id number], grade 1 diastolic dysfunction, mild left atrial enlargement and mild right ventricular enlargement. RV function mildly reduced.Nuclear study November 2018 showed ejection fraction 31%, diaphragmatic attenuation and mild inferior ischemia could not be excluded.Cardiac catheterization November 2018 showed normal coronary arteries and elevated left ventricular end-diastolic pressure of 26 mmHg. Cardiac medications titrated. Echocardiogram January 2021 showed ejection fraction 45 to 50%, mild left ventricular hypertrophy, grade 1 diastolic dysfunction, mild left atrial enlargement.  Since last seen, the patient has dyspnea with more extreme activities but not with routine activities. It is relieved with rest. It is not associated with chest pain. There is no orthopnea, PND or pedal edema. There is no syncope or palpitations. There is no exertional  chest pain.   The patient does not have symptoms concerning for COVID-19 infection (fever, chills, cough, or new shortness of breath).    Past Medical History:  Diagnosis Date  . Abscess of anal and rectal regions   . Acute renal insufficiency   . Asthma   . Chest pain   . Diabetes mellitus    "pre diabetic"- no meds  . DOE (dyspnea on exertion)   . Hemorrhoids, internal   . Hyperlipidemia   . Hypertension   . Sleep apnea    Past Surgical History:  Procedure Laterality Date  . COLONOSCOPY    . INCISION AND DRAINAGE PERIRECTAL ABSCESS  06/09/11  . LEFT HEART CATH AND CORONARY ANGIOGRAPHY N/A 08/06/2017   Procedure: LEFT HEART CATH AND CORONARY ANGIOGRAPHY;  Surgeon: Burnell Blanks, MD;  Location: [facility];  Service: Cardiovascular;  Laterality: N/A;  . POLYPECTOMY    . TONSILLECTOMY    . ULNAR COLLATERAL LIGAMENT RECONSTRUCTION  2003   left hand     Current Meds  Medication Sig  . acetaminophen (TYLENOL) 325 MG tablet Take 2 tablets (650 mg total) by mouth every 6 (six) hours as needed for mild pain (or Fever >/= 101).  Marland Kitchen albuterol (PROVENTIL HFA;VENTOLIN HFA) 108 (90 BASE) MCG/ACT inhaler Inhale 2 puffs into the lungs every 4 (four) hours as needed for wheezing or shortness of breath.  . ALPRAZolam (XANAX) 0.5 MG tablet Take 1 tablet (0.5 mg total) by mouth 2 (two) times daily as needed for anxiety.  Marland Kitchen aspirin 81 MG chewable tablet Chew 1 tablet (81 mg total) by mouth daily.  Marland Kitchen atorvastatin (LIPITOR) 10 MG tablet Take 10 mg by mouth daily.  . carvedilol (COREG) 6.25 MG tablet TAKE 1  TABLET BY MOUTH TWICE A DAY  . Dulaglutide (TRULICITY [place]) Inject into the skin.  . DULoxetine (CYMBALTA) 30 MG capsule Take 1 capsule (30 mg total) by mouth 2 (two) times daily.  . Empagliflozin (JARDIANCE PO) Take by mouth.  . ENTRESTO 49-51 MG TAKE 1 TABLET BY MOUTH TWICE A DAY  . Fluticasone-Salmeterol (ADVAIR) 250-50 MCG/DOSE AEPB Inhale 1 puff into the lungs 2 (two) times  daily.  . furosemide (LASIX) 40 MG tablet Take 40 mg by mouth daily.  Marland Kitchen HYDROcodone-acetaminophen (NORCO/VICODIN) 5-325 MG per tablet Take 1 tablet by mouth every 6 (six) hours as needed for moderate pain.      . Multiple Vitamin (MULTIVITAMIN) tablet Take 1 tablet by mouth daily.  . NON FORMULARY 1 each by Other route See admin instructions. Use CPAP machine nightly.  Marland Kitchen omeprazole (PRILOSEC) 20 MG capsule Take 20 mg by mouth 2 (two) times daily.  . traZODone (DESYREL) 100 MG tablet Take 1-2 tablets (100-200 mg total) by mouth at bedtime as needed.     Allergies:   Shellfish allergy   Social History   Tobacco Use  . Smoking status: Former Smoker    Types: Cigarettes    Quit date: 12/15/1995    Years since quitting: 24.7  . Smokeless tobacco: Never Used  Vaping Use  . Vaping Use: Never used  Substance Use Topics  . Alcohol use: Not Currently    Alcohol/week: 0.0 standard drinks    Comment: rare  . Drug use: No     Family Hx: The patient's family history includes Diabetes in his father and mother; Heart disease in his father and paternal grandmother. There is no history of Colon cancer, Esophageal cancer, Rectal cancer, or Stomach cancer.  ROS:   Please see the history of present illness.    Knee pain but no Fever, chills  or productive cough All other systems reviewed and are negative.  Wt Readings from Last 3 Encounters:  09/13/20 (!) 316 lb (143.3 kg)  08/25/19 (!) 325 lb (147.4 kg)  11/04/18 (!) 330 lb (149.7 kg)     Objective:    Vital Signs:  BP (!) 143/82   Pulse 89   Ht 5\' 11"  (1.803 m)   Wt (!) 316 lb (143.3 kg)   SpO2 97%   BMI 44.07 kg/m    VITAL SIGNS:  reviewed NAD Answers questions appropriately Normal affect Remainder of physical examination not performed (telehealth visit; coronavirus pandemic)  ASSESSMENT & PLAN:    1. Nonischemic cardiomyopathy-continue Entresto and carvedilol.  Most recent echocardiogram shows mild LV dysfunction.   Previous reduction felt to be hypertensive mediated. 2. Chronic combined systolic/diastolic congestive heart failure-he appears to be euvolemic based on history.  Continue fluid restriction, low-sodium diet and Lasix as needed. 3. Hypertension-blood pressure elevated.  Increase Entresto to 97/103 twice daily and follow blood pressure.  Check potassium and renal function in 1 week. 4. Hyperlipidemia-continue statin. 5. Morbid obesity-we discussed the importance of weight loss.  COVID-19 Education: The importance of social distancing was discussed today.  Time:   Today, I have spent 16 minutes with the patient with telehealth technology discussing the above problems.     Medication Adjustments/Labs and Tests Ordered: Current medicines are reviewed at length with the patient today.  Concerns regarding medicines are outlined above.   Tests Ordered: No orders of the defined types were placed in this encounter.   Medication Changes: No orders of the defined types were placed in this encounter.  Follow Up:  In Person in 6 month(s)  Signed, Kirk Ruths, MD  09/13/2020 9:16 AM    [place]

## 2020-09-12 ENCOUNTER — Ambulatory Visit: Payer: Medicare Other | Admitting: Adult Health

## 2020-09-13 ENCOUNTER — Encounter: Payer: Self-pay | Admitting: Cardiology

## 2020-09-13 ENCOUNTER — Telehealth (INDEPENDENT_AMBULATORY_CARE_PROVIDER_SITE_OTHER): Payer: Medicare Other | Admitting: Cardiology

## 2020-09-13 VITALS — BP 143/82 | HR 89 | Ht 71.0 in | Wt 316.0 lb

## 2020-09-13 DIAGNOSIS — I10 Essential (primary) hypertension: Secondary | ICD-10-CM | POA: Diagnosis not present

## 2020-09-13 DIAGNOSIS — I428 Other cardiomyopathies: Secondary | ICD-10-CM | POA: Diagnosis not present

## 2020-09-13 DIAGNOSIS — I5042 Chronic combined systolic (congestive) and diastolic (congestive) heart failure: Secondary | ICD-10-CM

## 2020-09-13 MED ORDER — ENTRESTO 97-103 MG PO TABS: 1.0000 | ORAL_TABLET | Freq: Two times a day (BID) | ORAL | 3 refills | Status: DC

## 2020-09-13 NOTE — Patient Instructions (Signed)
Medication Instructions:   INCREASE ENTRESTO TO 97/103 MG TWICE DAILY= 2 OF THE 49/51 MG TABLETS TWICE DAILY  *If you need a refill on your cardiac medications before your next appointment, please call your pharmacy*   Lab Work:  Your physician recommends that you return for lab work in: [place]  If you have labs (blood work) drawn today and your tests are completely normal, you will receive your results only by: Marland Kitchen MyChart Message (if you have MyChart) OR . A paper copy in the mail If you have any lab test that is abnormal or we need to change your treatment, we will call you to review the results.   Follow-Up: At [facility], you and your health needs are our priority.  As part of our continuing mission to provide you with exceptional heart care, we have created designated Provider Care Teams.  These Care Teams include your primary Cardiologist (physician) and Advanced Practice Providers (APPs -  Physician Assistants and Nurse Practitioners) who all work together to provide you with the care you need, when you need it.  We recommend signing up for the patient portal called "MyChart".  Sign up information is provided on this After Visit Summary.  MyChart is used to connect with patients for Virtual Visits (Telemedicine).  Patients are able to view lab/test results, encounter notes, upcoming appointments, etc.  Non-urgent messages can be sent to your provider as well.   To learn more about what you can do with MyChart, go to [email or web address].    Your next appointment:   6 month(s)  The format for your next appointment:   In Person  Provider:   Kirk Ruths, MD

## 2020-10-01 DIAGNOSIS — I5042 Chronic combined systolic (congestive) and diastolic (congestive) heart failure: Secondary | ICD-10-CM | POA: Diagnosis not present

## 2020-10-01 LAB — BASIC METABOLIC PANEL
BUN/Creatinine Ratio: 10 (ref 9–20)
BUN: 12 mg/dL (ref 6–24)
CO2: 23 mmol/L (ref 20–29)
Calcium: 10 mg/dL (ref 8.7–10.2)
Chloride: 97 mmol/L (ref 96–106)
Creatinine, Ser: 1.2 mg/dL (ref 0.76–1.27)
GFR calc Af Amer: 77 mL/min/{1.73_m2} (ref >59)
GFR calc non Af Amer: 67 mL/min/{1.73_m2} (ref >59)
Glucose: 148 mg/dL — ABNORMAL HIGH (ref 65–99)
Potassium: 4.2 mmol/L (ref 3.5–5.2)
Sodium: 136 mmol/L (ref 134–144)

## 2020-10-15 ENCOUNTER — Ambulatory Visit (INDEPENDENT_AMBULATORY_CARE_PROVIDER_SITE_OTHER): Payer: Medicare Other | Admitting: Psychiatry

## 2020-10-15 DIAGNOSIS — F411 Generalized anxiety disorder: Secondary | ICD-10-CM | POA: Diagnosis not present

## 2020-10-15 NOTE — Progress Notes (Signed)
[facility] Counselor/Therapist Progress Note  Patient ID: [name], MRN: [id number],    Date: 10/15/2020  Time Spent: 50 minutes   10:00am to 10:50am  Virtual Visit Note via [place] with patient by a video enabled telemedicine/telehealth application or telephone, with their informed consent, and verified patient privacy and that I am speaking with the correct person using two identifiers. I discussed the limitations, risks, security and privacy concerns of performing psychotherapy and management service by telephone and the availability of in person appointments. I also discussed with the patient that there may be a patient responsible charge related to this service. The patient expressed understanding and agreed to proceed. I discussed the treatment planning with the patient. The patient was provided an opportunity to ask questions and all were answered. The patient agreed with the plan and demonstrated an understanding of the instructions. The patient was advised to call  our office if  symptoms worsen or feel they are in a crisis state and need immediate contact.   Therapist Location: [facility] Patient Location: home   Treatment Type: Individual Therapy  Reported Symptoms: anxiety, depression "more off and on and had to take a Xanax the other night"   Mental Status Exam:  Appearance:   Casual     Behavior:  Appropriate, Sharing and Motivated  Motor:  Normal  Speech/Language:   Clear and Coherent  Affect:  anxiety, depressed  Mood:  anxious and depressed  Thought process:  goal directed  Thought content:    WNL  Sensory/Perceptual disturbances:    WNL  Orientation:  oriented to person, place, time/date, situation, day of week, month of year and year  Attention:  Good  Concentration:  Good  Memory:  WNL  Fund of knowledge:   Good  Insight:    Good  Judgment:   Good  Impulse Control:  Good   Risk Assessment: Danger to Self:   No Self-injurious Behavior: No Danger to Others: No Duty to Warn:no Physical Aggression / Violence:No  Access to Firearms a concern: No  Gang Involvement:No   Subjective: Patient today reports anxiety and "some depression", residual grief issues.   Interventions: Cognitive Behavioral Therapy, Ego-Supportive and Grief Therapy  Diagnosis:   ICD-10-CM   1. Generalized anxiety disorder  F41.1     Plan: Patient not signing tx plan on computer screen due to [place].  Treatment Goals: Goals may remain on the tx plan as patient works on strategies to achieve goals. Progress or lack of progress will be documented each session in "Progress" section on Plan.  Long term goal: Develop the ability to recognize, accept, and cope with feelings of depression.  Short term goal: Identify and replace depressive thinking that leads to depressive feelings and actions.  Strategy: Educate patient about cognitive restructuring, including self-monitoring of automatic thoughts, and working to interrupt his depressive thought patterns and replace with cognitive patterns that do not support depression.  Progress: Patient today reporting anxiety and "some depression", and residual grief issues re: brother's death. Sadness re: Covid and how he wasn't able to attend brother's funeral. Processed more of his grief as we talked today, sharing what he misses about his brother.  Health concerns continue, has missed some knee injections due to Covid, and states he will be getting them again next week. Shared that he and wife ran into the daughter that has not been speaking to them since August 2021, and she actually spoke to them for a while  and things "were cordial". Discussed that contact in more detail and they are cautiously optimistic, based on some past occurrences in relationship. Has been allowed to also have contact with 15 yr old grandson and daughter gave them picture of the grandson. Staying I touch  with family up in [place] and also his [place] buddies. Dr. Virgina Jock no longer his PCP. New PCP appt in mid-March.  Was working to lose some weight and states he has lost approximately 10 pounds since last appointment.  Encouraged patient in his weight loss, encouraged him also in intentionally looking for more positives daily, to focus on what he can control versus cannot, staying in the present rather than too far into the future, practicing positive self talk, remaining safe and following COVID precautions as he reports he is not vaccinated, and remain in contact with people who are supportive for him.  Goal review and progress/challenges noted with patient.  Next appointment within 4 weeks.   Shanon Ace, LCSW

## 2020-10-23 DIAGNOSIS — M1711 Unilateral primary osteoarthritis, right knee: Secondary | ICD-10-CM | POA: Diagnosis not present

## 2020-10-23 DIAGNOSIS — M1712 Unilateral primary osteoarthritis, left knee: Secondary | ICD-10-CM | POA: Diagnosis not present

## 2020-10-30 ENCOUNTER — Ambulatory Visit: Payer: Medicare Other | Admitting: Adult Health

## 2020-11-01 DIAGNOSIS — M1712 Unilateral primary osteoarthritis, left knee: Secondary | ICD-10-CM | POA: Diagnosis not present

## 2020-11-01 DIAGNOSIS — M1711 Unilateral primary osteoarthritis, right knee: Secondary | ICD-10-CM | POA: Diagnosis not present

## 2020-11-01 DIAGNOSIS — M25512 Pain in left shoulder: Secondary | ICD-10-CM | POA: Diagnosis not present

## 2020-11-18 NOTE — Patient Instructions (Addendum)
Health Maintenance Due  Topic Date Due  . TETANUS/TDAP - get records along with other vaccines  Never done  . COLONOSCOPY - We will call you within two weeks about your referral to Dr. [name]. If you do not hear within 2 weeks, give [place] a call.   12/28/2016   Get your updated diabetic eye exam and have them send me a copy pretty please  Try omeprazole 20 mg once a day before breakfast or dinner and see if that's effective- if it is can continue that dose- we always want you on lowest effective dose   Please stop by lab before you go If you have mychart- we will send your results within 3 business days of [place] receiving them.  If you do not have mychart- we will call you about results within 5 business days of [place] receiving them.  *please also note that you will see labs on mychart as soon as they post. I will later go in and write notes on them- will say "notes from Dr. Yong Channel"    Recommended follow up: Return in about 4 months (around 03/21/2021) for physical or sooner if needed.
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
## 2020-11-19 ENCOUNTER — Other Ambulatory Visit: Payer: Self-pay

## 2020-11-19 ENCOUNTER — Encounter: Payer: Self-pay | Admitting: Family Medicine

## 2020-11-19 ENCOUNTER — Ambulatory Visit (INDEPENDENT_AMBULATORY_CARE_PROVIDER_SITE_OTHER): Payer: Medicare Other | Admitting: Family Medicine

## 2020-11-19 VITALS — BP 128/72 | HR 89 | Temp 97.2°F | Ht 71.0 in | Wt 330.6 lb

## 2020-11-19 DIAGNOSIS — J4540 Moderate persistent asthma, uncomplicated: Secondary | ICD-10-CM

## 2020-11-19 DIAGNOSIS — M170 Bilateral primary osteoarthritis of knee: Secondary | ICD-10-CM

## 2020-11-19 DIAGNOSIS — J45909 Unspecified asthma, uncomplicated: Secondary | ICD-10-CM | POA: Insufficient documentation

## 2020-11-19 DIAGNOSIS — M1711 Unilateral primary osteoarthritis, right knee: Secondary | ICD-10-CM | POA: Diagnosis not present

## 2020-11-19 DIAGNOSIS — F331 Major depressive disorder, recurrent, moderate: Secondary | ICD-10-CM | POA: Diagnosis not present

## 2020-11-19 DIAGNOSIS — I10 Essential (primary) hypertension: Secondary | ICD-10-CM

## 2020-11-19 DIAGNOSIS — M179 Osteoarthritis of knee, unspecified: Secondary | ICD-10-CM | POA: Insufficient documentation

## 2020-11-19 DIAGNOSIS — E119 Type 2 diabetes mellitus without complications: Secondary | ICD-10-CM | POA: Diagnosis not present

## 2020-11-19 DIAGNOSIS — K612 Anorectal abscess: Secondary | ICD-10-CM

## 2020-11-19 DIAGNOSIS — F411 Generalized anxiety disorder: Secondary | ICD-10-CM

## 2020-11-19 DIAGNOSIS — Z1211 Encounter for screening for malignant neoplasm of colon: Secondary | ICD-10-CM

## 2020-11-19 DIAGNOSIS — Z114 Encounter for screening for human immunodeficiency virus [HIV]: Secondary | ICD-10-CM | POA: Diagnosis not present

## 2020-11-19 DIAGNOSIS — I428 Other cardiomyopathies: Secondary | ICD-10-CM | POA: Diagnosis not present

## 2020-11-19 DIAGNOSIS — K219 Gastro-esophageal reflux disease without esophagitis: Secondary | ICD-10-CM | POA: Insufficient documentation

## 2020-11-19 DIAGNOSIS — J309 Allergic rhinitis, unspecified: Secondary | ICD-10-CM | POA: Insufficient documentation

## 2020-11-19 DIAGNOSIS — M1712 Unilateral primary osteoarthritis, left knee: Secondary | ICD-10-CM | POA: Diagnosis not present

## 2020-11-19 DIAGNOSIS — M1710 Unilateral primary osteoarthritis, unspecified knee: Secondary | ICD-10-CM | POA: Insufficient documentation

## 2020-11-19 LAB — CBC WITH DIFFERENTIAL/PLATELET
Basophils Absolute: 0.1 10*3/uL (ref 0.0–0.1)
Basophils Relative: 0.7 % (ref 0.0–3.0)
Eosinophils Absolute: 0.2 10*3/uL (ref 0.0–0.7)
Eosinophils Relative: 2.1 % (ref 0.0–5.0)
HCT: 44.7 % (ref 39.0–52.0)
Hemoglobin: 14.8 g/dL (ref 13.0–17.0)
Lymphocytes Relative: 13.7 % (ref 12.0–46.0)
Lymphs Abs: 1.1 10*3/uL (ref 0.7–4.0)
MCHC: 33.1 g/dL (ref 30.0–36.0)
MCV: 89 fl (ref 78.0–100.0)
Monocytes Absolute: 0.8 10*3/uL (ref 0.1–1.0)
Monocytes Relative: 9.3 % (ref 3.0–12.0)
Neutro Abs: 6.2 10*3/uL (ref 1.4–7.7)
Neutrophils Relative %: 74.2 % (ref 43.0–77.0)
Platelets: 187 10*3/uL (ref 150.0–400.0)
RBC: 5.02 Mil/uL (ref 4.22–5.81)
RDW: 15.8 % — ABNORMAL HIGH (ref 11.5–15.5)
WBC: 8.4 10*3/uL (ref 4.0–10.5)

## 2020-11-19 LAB — COMPREHENSIVE METABOLIC PANEL
ALT: 36 U/L (ref 0–53)
AST: 30 U/L (ref 0–37)
Albumin: 4.2 g/dL (ref 3.5–5.2)
Alkaline Phosphatase: 70 U/L (ref 39–117)
BUN: 12 mg/dL (ref 6–23)
CO2: 21 mEq/L (ref 19–32)
Calcium: 9.5 mg/dL (ref 8.4–10.5)
Chloride: 102 mEq/L (ref 96–112)
Creatinine, Ser: 1.13 mg/dL (ref 0.40–1.50)
GFR: 72.04 mL/min (ref >60.00)
Glucose, Bld: 216 mg/dL — ABNORMAL HIGH (ref 70–99)
Potassium: 4.6 mEq/L (ref 3.5–5.1)
Sodium: 134 mEq/L — ABNORMAL LOW (ref 135–145)
Total Bilirubin: 0.5 mg/dL (ref 0.2–1.2)
Total Protein: 7.4 g/dL (ref 6.0–8.3)

## 2020-11-19 LAB — LIPID PANEL
Cholesterol: 174 mg/dL (ref 0–200)
HDL: 47.1 mg/dL (ref >39.00)
LDL Cholesterol: 110 mg/dL — ABNORMAL HIGH (ref 0–99)
NonHDL: 127.05
Total CHOL/HDL Ratio: 4
Triglycerides: 83 mg/dL (ref 0.0–149.0)
VLDL: 16.6 mg/dL (ref 0.0–40.0)

## 2020-11-19 LAB — HEMOGLOBIN A1C: Hgb A1c MFr Bld: 9.2 % — ABNORMAL HIGH (ref 4.6–6.5)

## 2020-11-20 ENCOUNTER — Other Ambulatory Visit: Payer: Self-pay

## 2020-11-20 ENCOUNTER — Other Ambulatory Visit: Payer: Self-pay | Admitting: Family Medicine

## 2020-11-20 LAB — HIV ANTIBODY (ROUTINE TESTING W REFLEX): HIV 1&2 Ab, 4th Generation: NONREACTIVE

## 2020-11-20 MED ORDER — ROSUVASTATIN CALCIUM 20 MG PO TABS: 20.0000 mg | ORAL_TABLET | Freq: Every day | ORAL | 3 refills | Status: DC

## 2020-11-20 MED ORDER — EMPAGLIFLOZIN 25 MG PO TABS: 25.0000 mg | ORAL_TABLET | Freq: Every day | ORAL | 5 refills | Status: DC

## 2020-11-20 MED ORDER — TRULICITY 1.5 MG/0.5ML ~~LOC~~ SOAJ: 1.5000 mg | SUBCUTANEOUS | 5 refills | Status: DC

## 2020-11-20 NOTE — Telephone Encounter (Signed)
Error. Medications was already sent in .

## 2020-11-21 ENCOUNTER — Other Ambulatory Visit: Payer: Self-pay

## 2020-11-21 DIAGNOSIS — E119 Type 2 diabetes mellitus without complications: Secondary | ICD-10-CM

## 2020-12-02 ENCOUNTER — Ambulatory Visit: Payer: Medicare Other | Admitting: Psychiatry

## 2020-12-13 ENCOUNTER — Telehealth: Payer: Self-pay

## 2020-12-13 NOTE — Telephone Encounter (Signed)
Pharmacy called and stated the freestyle lancets need to be changed to once a day instead of 3x times a day due to medicare not covering the 3x daily can we resend to pharmacy

## 2020-12-16 NOTE — Telephone Encounter (Signed)
Did not find a prescription for lancets in patients medications, called pharmacy and they state that they do not have a prescription for them either.Unable to find anything regarding lancets.

## 2020-12-16 NOTE — Telephone Encounter (Signed)
Spoke with jazz before sending and she states she knows about the lancets.

## 2020-12-17 ENCOUNTER — Ambulatory Visit: Payer: Medicare Other | Admitting: Psychiatry

## 2020-12-30 ENCOUNTER — Other Ambulatory Visit: Payer: Self-pay

## 2020-12-30 MED ORDER — FREESTYLE LANCETS MISC: 12 refills | Status: DC

## 2021-01-03 LAB — HM DIABETES EYE EXAM

## 2021-01-06 ENCOUNTER — Ambulatory Visit: Payer: Medicare Other | Admitting: Dietician

## 2021-01-06 ENCOUNTER — Encounter: Payer: Self-pay | Admitting: Family Medicine

## 2021-01-23 ENCOUNTER — Ambulatory Visit: Payer: Medicare Other | Admitting: Psychiatry

## 2021-02-13 ENCOUNTER — Other Ambulatory Visit: Payer: Self-pay | Admitting: Physician Assistant

## 2021-02-16 ENCOUNTER — Other Ambulatory Visit: Payer: Self-pay | Admitting: Cardiology

## 2021-02-16 DIAGNOSIS — E7800 Pure hypercholesterolemia, unspecified: Secondary | ICD-10-CM

## 2021-02-16 DIAGNOSIS — I10 Essential (primary) hypertension: Secondary | ICD-10-CM

## 2021-02-16 DIAGNOSIS — I420 Dilated cardiomyopathy: Secondary | ICD-10-CM

## 2021-02-16 DIAGNOSIS — I509 Heart failure, unspecified: Secondary | ICD-10-CM

## 2021-02-20 ENCOUNTER — Ambulatory Visit: Payer: Medicare Other | Admitting: Registered"

## 2021-02-26 ENCOUNTER — Ambulatory Visit: Payer: Medicare Other | Admitting: Adult Health

## 2021-02-28 ENCOUNTER — Ambulatory Visit (INDEPENDENT_AMBULATORY_CARE_PROVIDER_SITE_OTHER): Payer: Medicare Other | Admitting: Psychiatry

## 2021-02-28 DIAGNOSIS — F411 Generalized anxiety disorder: Secondary | ICD-10-CM | POA: Diagnosis not present

## 2021-02-28 NOTE — Progress Notes (Signed)
[facility] Counselor/Therapist Progress Note  Patient ID: [name], MRN: [id number],    Date: 02/28/2021  Time Spent: 50 minutes   Virtual Visit via Telephone Only Note Connected with patient by a video enabled telemedicine/telehealth application or telephone, with their informed consent, and verified patient privacy and that I am speaking with the correct person using two identifiers. I discussed the limitations, risks, security and privacy concerns of performing psychotherapy and management service by telephone and the availability of in person appointments. I also discussed with the patient that there may be a patient responsible charge related to this service. The patient expressed understanding and agreed to proceed. I discussed the treatment planning with the patient. The patient was provided an opportunity to ask questions and all were answered. The patient agreed with the plan and demonstrated an understanding of the instructions. The patient was advised to call  our office if  symptoms worsen or feel they are in a crisis state and need immediate contact.   Therapist Location: [facility] Patient Location: home    Treatment Type: Individual Therapy  Reported Symptoms: grief (family losses and serious BH issues with a family member), anxiety, depression, "worries a lot" about other family members; "physically trying to follow instructions of my doctors and have to push myself to move around more and once I do, then I do feel better."  Did not get vaccinated for Covid so "fearful about getting it but it was my choice to not get vaccinated."  Mental Status Exam:  Appearance:   N/A   Telehealth      Behavior:  Appropriate, Sharing, and Motivated  Motor:  Has a lot of difficulty moving about due to multiple health concerns for which he is being followed  Speech/Language:   Clear and Coherent  Affect:  Depressed and anxiety  Mood:  anxious and depressed   Thought process:  normal  Thought content:    WNL  Sensory/Perceptual disturbances:    WNL  Orientation:  oriented to person, place, time/date, situation, day of week, month of year, year, and stated date of February 28, 2021  Attention:  Good  Concentration:  Good  Memory:  WNL  Fund of knowledge:   Good  Insight:    Good  Judgment:   Good and Fair  Impulse Control:  Good and Fair   Risk Assessment: Danger to Self:  No Self-injurious Behavior: No Danger to Others: No Duty to Warn:no Physical Aggression / Violence:No  Access to Firearms a concern: No  Gang Involvement:No   Subjective:  Patient today reporting anxiety, depression, and "worrying about family and self  and the world with so many things going on." Trying not to overthink as much and am still working on that. Needed session today to focus on and receive support in the family losses and some current concerns with other family member (physical and behavioral health).  Patient processing grief and concern and other family members who are currently unstable emotionally but feels they are getting some BH treatment.  Patient not reporting any further weight loss since last appointment and he is encouraged to follow through on doctor recommendations regarding that.  Dealing with cumulative losses within the family, and is appreciative today to be able to vent and talk through thoughts and feelings "as I tend to be the rock in the family when things go wrong."  Working to decrease his "worrying" as that tends to lead him to negative or case scenarios  and discussed ways with him of reducing his worrying and hold the thought that things may go in a more positive direction than what he is assuming and look more towards the positive, which patient agreed to work on between sessions and feels that if he can make that switch it would be helpful.   Interventions: Cognitive Behavioral Therapy, Solution-Oriented/Positive Psychology, and  Ego-Supportive   Diagnosis:   ICD-10-CM   1. Generalized anxiety disorder  F41.1       Plan:  Patient today showing good motivation in talking through his various concerns especially family grief and concern about family members with significant mental health issues including one who remains hospitalized at this point.  Patient feels it has helped him to be able to talk through these concerns and receive support today.  Also able to realize when he needs to set limits with family as he tends to be their emotional support person and it can be "a lot to deal with sometimes."  Totally supported his setting limits and he was able to understand that setting limits does not mean that his caring for family is any less, but rather means that his own self-care is very important as well.  Encouraged patient and following his medical doctor's advice, also to intentionally look for more positives daily, to stay in the present rather than the future, to practice positive self talk daily, to focus on what he can control versus cannot, to remain safe in following COVID precautions as he is not vaccinated, remain in contact with people who are supportive of him, and to feel good about the strength he is showing and working on his treatment goal plan while in the midst of challenging circumstances as he tries to move forward in a positive direction towards the best possible emotional and physical health for him.     Treatment Goals: Goals may remain on the tx plan as patient works on strategies to achieve goals.  Progress or lack of progress will be documented each session in "Progress" section on Plan.   Long term goal: Develop the ability to recognize, accept, and cope with feelings of depression/anxiety.   Short term goal: Identify and replace depressive/anxious thinking that leads to depressive/anxious feelings and actions.   Strategy: Educate patient about cognitive restructuring, including  self-monitoring of automatic thoughts, and working to interrupt his depressive/anxious thought patterns and replace with cognitive patterns that do not support depression or anxiety.    Goal review and progress/challenges noted with patient.  Next appointment within 1 month.   Shanon Ace, LCSW

## 2021-04-11 ENCOUNTER — Encounter: Payer: Medicare Other | Admitting: Family Medicine

## 2021-04-11 ENCOUNTER — Telehealth: Payer: Self-pay

## 2021-04-11 NOTE — Telephone Encounter (Signed)
Please place him on cancellation list and review schedule for any sooner openings

## 2021-04-11 NOTE — Telephone Encounter (Signed)
Added to cancellation list 
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
## 2021-04-11 NOTE — Telephone Encounter (Signed)
Dr. Yong Channel please see message and advise.

## 2021-04-11 NOTE — Telephone Encounter (Signed)
Patient had to cancel CPE today 8/5 to due illness and scheduled for next available in jan 23 and would like to know if we can work him in any sooner than that for a CPE  Please advise

## 2021-04-16 ENCOUNTER — Ambulatory Visit: Payer: Medicare Other | Admitting: Psychiatry

## 2021-04-17 ENCOUNTER — Encounter: Payer: Self-pay | Admitting: Family Medicine

## 2021-04-17 ENCOUNTER — Other Ambulatory Visit: Payer: Self-pay

## 2021-04-17 ENCOUNTER — Ambulatory Visit (INDEPENDENT_AMBULATORY_CARE_PROVIDER_SITE_OTHER): Payer: Medicare Other | Admitting: Family Medicine

## 2021-04-17 VITALS — BP 143/76 | HR 97 | Temp 97.9°F | Ht 71.0 in | Wt 306.6 lb

## 2021-04-17 DIAGNOSIS — I428 Other cardiomyopathies: Secondary | ICD-10-CM

## 2021-04-17 DIAGNOSIS — I10 Essential (primary) hypertension: Secondary | ICD-10-CM

## 2021-04-17 DIAGNOSIS — F331 Major depressive disorder, recurrent, moderate: Secondary | ICD-10-CM

## 2021-04-17 DIAGNOSIS — R059 Cough, unspecified: Secondary | ICD-10-CM

## 2021-04-17 DIAGNOSIS — E119 Type 2 diabetes mellitus without complications: Secondary | ICD-10-CM

## 2021-04-17 DIAGNOSIS — Z1211 Encounter for screening for malignant neoplasm of colon: Secondary | ICD-10-CM

## 2021-04-17 DIAGNOSIS — K219 Gastro-esophageal reflux disease without esophagitis: Secondary | ICD-10-CM

## 2021-04-17 MED ORDER — OMEPRAZOLE 40 MG PO CPDR: 40.0000 mg | DELAYED_RELEASE_CAPSULE | Freq: Two times a day (BID) | ORAL | 0 refills | Status: DC

## 2021-04-17 NOTE — Patient Instructions (Addendum)
Health Maintenance Due  Topic Date Due   PNEUMOCOCCAL POLYSACCHARIDE VACCINE AGE 58-64 HIGH RISK Differed. Never done   Zoster Vaccines- Shingrix (1 of 2) Differed.  Never done   COLONOSCOPY (Pts 45-46yr Insurance coverage will need to be confirmed) waiting on them to call him back to scheduled.   [facility] contact Address: [address] [place] [address]Phone: [phone] 12/28/2016   TETANUS/TDAP Deferred. Consider at pharmacy once you feel better. 10/17/2020   INFLUENZA VACCINE Not Available in office yet. Please consider getting your flu shot in the Fall. If you get this outside of our office, please let u[place]ore[place]now.  04/07/2021   Jazz please order and complete COVID-19 test under cough.  I am glad to hear that you are continuing your efforts with portioning your meals- please keep up the good work!  We will call you within two weeks about your referral to GI for colon cancer screening and possible EGD. If you do not hear within 2 weeks, give u[place]aa call.   Start Omeprazole 40 mg twice daily for a month. After a month, return to 20 mg twice daily.  After you do bloodwork tomorrow- you can take a second lasix 6 hours later.   Please schedule next available lab visit for tomorrow- you do not have to be fasting for this.  Recommended follow up: keep January visit but we may need to see each other sooner depending on how this progresses- hoping for quick turnaround

## 2021-04-17 NOTE — Progress Notes (Signed)
Phone [phone] In person visit   Subjective:   [name] is a 60 y.o. year old very pleasant male patient who presents for/with See problem oriented charting Chief Complaint  Patient presents with   Bloated    After eating    Gastroesophageal Reflux    Getting worse    Cough    Since 'Sunday when the acid reflux started    This visit occurred during the SARS-CoV-2 public health emergency.  Safety protocols were in place, including screening questions prior to the visit, additional usage of staff PPE, and extensive cleaning of exam room while observing appropriate contact time as indicated for disinfecting solutions.   Past Medical History-  Patient Active Problem List   Diagnosis Date Noted   Diabetes mellitus without complication (HCC) 11/19/2020    Priority: High   NICM (nonischemic cardiomyopathy) (HCC)     Priority: High   Osteoarthritis, knee 11/19/2020    Priority: Medium   Asthma 11/19/2020    Priority: Medium   GAD (generalized anxiety disorder) 11/19/2020    Priority: Medium   GERD (gastroesophageal reflux disease) 11/19/2020    Priority: Medium   Major depressive disorder, recurrent episode, moderate (HCC) 06/16/2018    Priority: Medium   Normal coronary arteries 08/10/2017    Priority: Medium   Sleep apnea 08/10/2017    Priority: Medium   Hypertension, malignant 11/13/2014    Priority: Medium   Allergic rhinitis 11/19/2020    Priority: Low   Morbid obesity (HCC) 08/10/2017    Priority: Low   Anorectal abscess,left 07/27/2011    Priority: Low    Medications- reviewed and updated Current Outpatient Medications  Medication Sig Dispense Refill   acetaminophen (TYLENOL) 325 MG tablet Take 2 tablets (650 mg total) by mouth every 6 (six) hours as needed for mild pain (or Fever >/= 101).     albuterol (PROVENTIL HFA;VENTOLIN HFA) 108 (90 BASE) MCG/ACT inhaler Inhale 2 puffs into the lungs every 4 (four) hours as needed for wheezing or shortness of  breath.     ALPRAZolam (XANAX) 0.5 MG tablet Take 1 tablet (0.5 mg total) by mouth 2 (two) times daily as needed for anxiety. 30 tablet 5   aspirin 81 MG chewable tablet Chew 1 tablet (81 mg total) by mouth daily.     carvedilol (COREG) 6.25 MG tablet TAKE 1 TABLET BY MOUTH TWICE A DAY 180 tablet 1   Dulaglutide (TRULICITY) 1.5 MG/0.5ML SOPN Inject 1.5 mg into the skin once a week. 5 mL 5   DULoxetine (CYMBALTA) 30 MG capsule Take 1 capsule (30 mg total) by mouth 2 (two) times daily. 180 capsule 1   empagliflozin (JARDIANCE) 25 MG TABS tablet Take 1 tablet (25 mg total) by mouth daily before breakfast. 30 tablet 5   Fluticasone-Salmeterol (ADVAIR) 250-50 MCG/DOSE AEPB Inhale 1 puff into the lungs 2 (two) times daily.     furosemide (LASIX) 40 MG tablet Take 40 mg by mouth daily.     HYDROcodone-acetaminophen (NORCO/VICODIN) 5-325 MG per tablet Take 1 tablet by mouth every 6 (six) hours as needed for moderate pain.        30'$  tablet 0   Lancets (FREESTYLE) lancets Use to test blood sugar once daily. Dx: e11.9 100 each 12   Multiple Vitamin (MULTIVITAMIN) tablet Take 1 tablet by mouth daily.     NON FORMULARY 1 each by Other route See admin instructions. Use CPAP machine nightly.     omeprazole (PRILOSEC) 20 MG capsule Take 20  mg by mouth 2 (two) times daily.     rosuvastatin (CRESTOR) 20 MG tablet Take 1 tablet (20 mg total) by mouth daily. 90 tablet 3   sacubitril-valsartan (ENTRESTO) 97-103 MG Take 1 tablet by mouth 2 (two) times daily. 180 tablet 3   traZODone (DESYREL) 100 MG tablet TAKE 1-2 TABLETS (100-200 MG TOTAL) BY MOUTH AT BEDTIME AS NEEDED. 180 tablet 0   omeprazole (PRILOSEC) 40 MG capsule Take 1 capsule (40 mg total) by mouth in the morning and at bedtime. After 1 month return to 20 mg twice daily 60 capsule 0   No current facility-administered medications for this visit.     Objective:  BP (!) 143/76   Pulse 97   Temp 97.9 F (36.6 C) (Temporal)   Ht '5\' 11"'$  (1.803 m)   Wt  (!) 306 lb 9.6 oz (139.1 kg)   SpO2 97%   BMI 42.76 kg/m  Gen: NAD, resting comfortably CV: RRR no murmurs rubs or gallops Lungs: CTAB no crackles, wheeze, rhonchi Abdomen: soft/nontender/nondistended/normal bowel sounds. No rebound or guarding.  Ext: trace edema Skin: warm, dry     Assessment and Plan  # GERD #Extreme Bloating after Meals S:bloating and reflux started on Sunday. Started taking mylaanta or pepcid with some relief. He is on omeprazole 20 mg twice daily. We had considered once a day dosing at last visit but he never reduced. No chest pain but notes Upper throat burning and tickle. Worsened on Sunday and also started with cough at that time and very mild SOB particularly when laying down- see NICM discussion below   After pepcid starts belching and feels immediately better. Appetite lower this week. No epigastric pain. Down 24 lbs from last visit- working hard on reducing portoins.   No blood in stool. No melena. Some nausea. No vomiting. Some loose stools but stable over the years. No constipation. History of perianal abscess so tries to avoid constipation.  A/P: Patient with history of GERD with recent worsening on Sunday associated with cough and some shortness of breath with laying down-he states when he lays down he gets a tickle sensation in his throat which causes cough-could certainly be reflux he also gets a burning upper throat pain. -Also having some significant bloating but this improves with belching with Pepcid which makes me think likely related to GERD  We opted to treat with omeprazole increased from 20 mg twice daily to 40 mg twice daily for 1 month and refer to GI to see if he needs endoscopy.  Also overdue for colonoscopy  Given new cough though I suspect this is related to GERD but we will test for COVID to be cautious  May have some fluid overload as well-see NICM section  # Diabetes S: Medication:Trulicity 1.5 [id number] once a week and Jardiance 25  mg daily before breakfast CBGs- 118 this morning Exercise and diet- not exercising recently- other than walking around house but knees bother him. Has lost weight with reducing portion size/intake Lab Results  Component Value Date   HGBA1C 9.2 (H) 11/19/2020   HGBA1C 6.9 (H) 11/13/2014  A/P: poorly controlled last check but hopefully improving- will have him back to recheck (lab was closed at time of visit).  Continue current medications for now  #Nonischemic cardiomyopathy with cough #hypertension S: medication: Carvedilol 6.25 mg twice daily, Entresto 97-103 mg,  Lasix 40 mg daily  Off and on shortness of breath this week- notices improvement when takes lasix. No increased edema. More commonly  short of breath at night- with laying down. Sleeps with 3 pillows at baseline  Started coughing Sunday of this week. Has not done rapid home test. BP Readings from Last 3 Encounters:  04/17/21 (!) 143/76  11/19/20 128/72  09/13/20 (!) 143/82  A/P: The fact that patient's symptoms improved in regards to shortness of breath when he takes Lasix makes me wonder if he could have some mild fluid overload.  Also cough and shortness of breath worse at night when laying down even though he is on 3 pillows-possible orthopnea.  We will check CBC, CMP, BNP.  Once these are obtained I am okay with him taking an additional dose of Lasix tomorrow-he has been told in the past if possible fluid overload could take additional dose and he may consider 3 to 4 days ago over the weekend  #hyperlipidemia S: Medication: rosuvastatin 20 mg up from atorvastatin '10mg'$ , also on aspirin for primary prevention Lab Results  Component Value Date   CHOL 174 11/19/2020   HDL 47.10 11/19/2020   LDLCALC 110 (H) 11/19/2020   TRIG 83.0 11/19/2020   CHOLHDL 4 11/19/2020  A/P: We will check direct LDL with labs-hopefully improved  # Asthma S: Maintenance Medication: Compliant with Advair 250-50 mcg 1 puff twice daily. No wheezing  lately- some cough-has been taking albuterol  A/P: I think symptoms are reflux related but want him to try albuterol as a data point-she will let me know if there is improvement  #Depression/generalized anxiety disorder-managed by [facility] S: Medication: Managed by psychiatry with Cymbalta 30 mg BID, alprazolam 0.5 mg twice daily as needed, trazodone 100 to 200 mg before bed for insomnia  Closer to brothers birthday - lost to [place] Depression screen [facility] 2/9 04/17/2021 11/19/2020  Decreased Interest 0 0  Down, Depressed, Hopeless 1 0  PHQ - 2 Score 1 0  Altered sleeping 2 0  Tired, decreased energy 1 0  Change in appetite 2 0  Feeling bad or failure about yourself  0 0  Trouble concentrating 1 0  Moving slowly or fidgety/restless 1 0  Suicidal thoughts 0 0  PHQ-9 Score 8 0  Difficult doing work/chores Somewhat difficult Not difficult at all  A/P: Mild poor control on PHQ-9 today but brothers birthday that was lost to [place] is upcoming-this was heavily on patient mind especially since we are testing for COVID today and feels like he needs to be there for his nephews-he states generally well controlled and wants to continue current medication and psychiatry follow-up  #OSA managed by [facility]-continue CPAP-he states he is compliant-does feel somewhat choked up in the evening with cough and CPAP  Recommended follow up: No follow-ups on file. Future Appointments  Date Time Provider [place]  04/23/2021  9:30 AM Ardelle Lesches, RD [place] [facility]  04/29/2021 10:00 AM Shanon Ace, LCSW [facility] None  07/14/2021  8:30 AM Ward Givens, NP [facility] None  09/29/2021  1:20 PM Marin Olp, MD [facility]    Lab/Order associations:   ICD-10-CM   1. Diabetes mellitus without complication (HCC)  [id number] CBC with Differential/Platelet    Comprehensive metabolic panel    Hemoglobin A1c    LDL cholesterol, direct    CANCELED: CBC with Differential/Platelet    CANCELED:  Comprehensive metabolic panel    CANCELED: Hemoglobin A1c    2. Major depressive disorder, recurrent episode, moderate (HCC)  F33.1     3. Hypertension, malignant  I10     4. Gastroesophageal reflux disease,  unspecified whether esophagitis present  K21.9 Ambulatory referral to Gastroenterology    5. Screen for colon cancer  Z12.11 Ambulatory referral to Gastroenterology    6. NICM (nonischemic cardiomyopathy) (HCC)  I42.8 B Nat Peptide    CANCELED: B Nat Peptide    7. Cough  R05.9 Novel Coronavirus, NAA ([facility])      Meds ordered this encounter  Medications   DISCONTD: omeprazole (PRILOSEC) 40 MG capsule    Sig: Take 1 capsule (40 mg total) by mouth in the morning and at bedtime. After 1 month return to 20 mg twice daily    Dispense:  30 capsule    Refill:  0   omeprazole (PRILOSEC) 40 MG capsule    Sig: Take 1 capsule (40 mg total) by mouth in the morning and at bedtime. After 1 month return to 20 mg twice daily    Dispense:  60 capsule    Refill:  0    I,Harris Phan,acting as a scribe for Garret Reddish, MD.,have documented all relevant documentation on the behalf of Garret Reddish, MD,as directed by  Garret Reddish, MD while in the presence of Garret Reddish, MD.   I, Garret Reddish, MD, have reviewed all documentation for this visit. The documentation on 04/17/21 for the exam, diagnosis, procedures, and orders are all accurate and complete.   Return precautions advised.  Garret Reddish, MD

## 2021-04-18 ENCOUNTER — Other Ambulatory Visit (INDEPENDENT_AMBULATORY_CARE_PROVIDER_SITE_OTHER): Payer: Medicare Other

## 2021-04-18 ENCOUNTER — Telehealth: Payer: Self-pay

## 2021-04-18 DIAGNOSIS — I428 Other cardiomyopathies: Secondary | ICD-10-CM

## 2021-04-18 DIAGNOSIS — E119 Type 2 diabetes mellitus without complications: Secondary | ICD-10-CM | POA: Diagnosis not present

## 2021-04-18 LAB — COMPREHENSIVE METABOLIC PANEL
ALT: 15 U/L (ref 0–53)
AST: 22 U/L (ref 0–37)
Albumin: 3.8 g/dL (ref 3.5–5.2)
Alkaline Phosphatase: 58 U/L (ref 39–117)
BUN: 11 mg/dL (ref 6–23)
CO2: 24 mEq/L (ref 19–32)
Calcium: 8.7 mg/dL (ref 8.4–10.5)
Chloride: 98 mEq/L (ref 96–112)
Creatinine, Ser: 1.13 mg/dL (ref 0.40–1.50)
GFR: 71.83 mL/min (ref >60.00)
Glucose, Bld: 112 mg/dL — ABNORMAL HIGH (ref 70–99)
Potassium: 3.2 mEq/L — ABNORMAL LOW (ref 3.5–5.1)
Sodium: 136 mEq/L (ref 135–145)
Total Bilirubin: 0.6 mg/dL (ref 0.2–1.2)
Total Protein: 8 g/dL (ref 6.0–8.3)

## 2021-04-18 LAB — HEMOGLOBIN A1C: Hgb A1c MFr Bld: 6.6 % — ABNORMAL HIGH (ref 4.6–6.5)

## 2021-04-18 LAB — CBC WITH DIFFERENTIAL/PLATELET
Basophils Absolute: 0.1 10*3/uL (ref 0.0–0.1)
Basophils Relative: 0.6 % (ref 0.0–3.0)
Eosinophils Absolute: 0.1 10*3/uL (ref 0.0–0.7)
Eosinophils Relative: 1.2 % (ref 0.0–5.0)
HCT: 45.1 % (ref 39.0–52.0)
Hemoglobin: 14.8 g/dL (ref 13.0–17.0)
Lymphocytes Relative: 6.2 % — ABNORMAL LOW (ref 12.0–46.0)
Lymphs Abs: 0.7 10*3/uL (ref 0.7–4.0)
MCHC: 32.9 g/dL (ref 30.0–36.0)
MCV: 89.9 fl (ref 78.0–100.0)
Monocytes Absolute: 1.5 10*3/uL — ABNORMAL HIGH (ref 0.1–1.0)
Monocytes Relative: 12.9 % — ABNORMAL HIGH (ref 3.0–12.0)
Neutro Abs: 9.3 10*3/uL — ABNORMAL HIGH (ref 1.4–7.7)
Neutrophils Relative %: 79.1 % — ABNORMAL HIGH (ref 43.0–77.0)
Platelets: 280 10*3/uL (ref 150.0–400.0)
RBC: 5.01 Mil/uL (ref 4.22–5.81)
RDW: 15.7 % — ABNORMAL HIGH (ref 11.5–15.5)
WBC: 11.8 10*3/uL — ABNORMAL HIGH (ref 4.0–10.5)

## 2021-04-18 LAB — BRAIN NATRIURETIC PEPTIDE: Pro B Natriuretic peptide (BNP): 65 pg/mL (ref 0.0–100.0)

## 2021-04-18 LAB — LDL CHOLESTEROL, DIRECT: Direct LDL: 66 mg/dL

## 2021-04-18 NOTE — Telephone Encounter (Signed)
From OV note yesterday: # GERD #Extreme Bloating after Meals S:bloating and reflux started on Sunday. Started taking mylaanta or pepcid with some relief. He is on omeprazole 20 mg twice daily. We had considered once a day dosing at last visit but he never reduced. No chest pain but notes Upper throat burning and tickle. Worsened on Sunday and also started with cough at that time and very mild SOB particularly when laying down- see NICM discussion below    After pepcid starts belching and feels immediately better. Appetite lower this week. No epigastric pain. Down 24 lbs from last visit- working hard on reducing portoins.    No blood in stool. No melena. Some nausea. No vomiting. Some loose stools but stable over the years. No constipation. History of perianal abscess so tries to avoid constipation.  A/P: Patient with history of GERD with recent worsening on Sunday associated with cough and some shortness of breath with laying down-he states when he lays down he gets a tickle sensation in his throat which causes cough-could certainly be reflux he also gets a burning upper throat pain. -Also having some significant bloating but this improves with belching with Pepcid which makes me think likely related to GERD   We opted to treat with omeprazole increased from 20 mg twice daily to 40 mg twice daily for 1 month and refer to GI to see if he needs endoscopy.  Also overdue for colonoscopy   Given new cough though I suspect this is related to GERD but we will test for COVID to be cautious

## 2021-04-18 NOTE — Telephone Encounter (Signed)
Patient's wife is calling in stating [name] was here yesterday for an appointment, and discussed a cough caused from acid reflux. Wanting to know if something can be called in.

## 2021-04-18 NOTE — Telephone Encounter (Signed)
The increased omeprazole was the planned treatment for this as you noted- please communicate this

## 2021-04-19 ENCOUNTER — Other Ambulatory Visit: Payer: Self-pay | Admitting: Family Medicine

## 2021-04-19 LAB — SARS-COV-2, NAA 2 DAY TAT

## 2021-04-19 LAB — NOVEL CORONAVIRUS, NAA: SARS-CoV-2, NAA: NOT DETECTED

## 2021-04-19 MED ORDER — POTASSIUM CHLORIDE CRYS ER 20 MEQ PO TBCR: EXTENDED_RELEASE_TABLET | ORAL | 0 refills | Status: DC

## 2021-04-21 ENCOUNTER — Telehealth: Payer: Self-pay

## 2021-04-21 ENCOUNTER — Other Ambulatory Visit: Payer: Self-pay

## 2021-04-21 DIAGNOSIS — R059 Cough, unspecified: Secondary | ICD-10-CM

## 2021-04-21 MED ORDER — BENZONATATE 100 MG PO CAPS: 100.0000 mg | ORAL_CAPSULE | Freq: Two times a day (BID) | ORAL | 0 refills | Status: DC | PRN

## 2021-04-21 NOTE — Telephone Encounter (Signed)
Pt made aware

## 2021-04-21 NOTE — Telephone Encounter (Signed)
Patient would also like to she if Dr. Yong Channel could send something in for his cough as well.

## 2021-04-21 NOTE — Telephone Encounter (Signed)
[name] can you check result notes- I thought there was a mention of chest x-ray in there  Team- Can send in tessalon 100 mg twice daily prn cough #20

## 2021-04-21 NOTE — Telephone Encounter (Signed)
Tessalon sent to pharmacy

## 2021-04-21 NOTE — Telephone Encounter (Signed)
Spouse called in stating that patients SOB has gotten better but he still has cough.  States Dr. Yong Channel wanted to know to possibly get an x ray ordered for patient.    Please follow up in regard.

## 2021-04-21 NOTE — Telephone Encounter (Signed)
Ok to order? Didn't see it mentioned in last OV note.

## 2021-04-23 ENCOUNTER — Other Ambulatory Visit: Payer: Self-pay

## 2021-04-23 ENCOUNTER — Encounter: Payer: Self-pay | Admitting: Dietician

## 2021-04-23 ENCOUNTER — Encounter: Payer: Medicare Other | Attending: Family Medicine | Admitting: Dietician

## 2021-04-23 DIAGNOSIS — E119 Type 2 diabetes mellitus without complications: Secondary | ICD-10-CM | POA: Diagnosis not present

## 2021-04-23 NOTE — Progress Notes (Signed)
Diabetes Self-Management Education  Visit Type: First/Initial  Appt. Start Time: 0810 Appt. End Time: 0940  04/23/2021  [name], identified by name and date of birth, is a 60 y.o. male with a diagnosis of Diabetes: Type 2.   ASSESSMENT Pt granddaughter and spouse present during appointment. Pt is currently taking Trulicity and Jardiance for their diabetes. A1c has dropped from 9.2 to 6.9 since March. Pt has lost 31 pounds since March. Pt reports making dietary changes that led to this, stopped eating pies and cakes and drinking sodas. Pt reports only eating once a day, usually late in the afternoon. Pt takes all of their medication on an empty stomach usually.  Pt checks their fasting blood sugar each morning, no problems using glucometer. Pt has neuropathy, checks feet every night. Pt reports knee pain, walks with a cane.  Height '5\' 11"'$  (1.803 m), weight (!) 301 lb 3.2 oz (136.6 kg). Body mass index is 42.01 kg/m.   Diabetes Self-Management Education - 04/23/21 0903       Visit Information   Visit Type First/Initial      Initial Visit   Diabetes Type Type 2    Are you currently following a meal plan? No    Are you taking your medications as prescribed? Yes    Date Diagnosed 2012      Health Coping   How would you rate your overall health? Fair      Psychosocial Assessment   Patient Belief/Attitude about Diabetes Motivated to manage diabetes    Self-management support Doctor's office;Family    Other persons present Patient;Spouse/SO;Family Member    Patient Concerns Nutrition/Meal planning;Healthy Lifestyle;Weight Control    Special Needs None    Preferred Learning Style No preference indicated    Learning Readiness Change in progress    How often do you need to have someone help you when you read instructions, pamphlets, or other written materials from your doctor or pharmacy? 1 - Never    What is the last grade level you completed in school? 12th grade       Pre-Education Assessment   Patient understands the diabetes disease and treatment process. Needs Instruction    Patient understands incorporating nutritional management into lifestyle. Needs Instruction    Patient undertands incorporating physical activity into lifestyle. Needs Instruction    Patient understands using medications safely. Needs Instruction    Patient understands monitoring blood glucose, interpreting and using results Needs Instruction    Patient understands prevention, detection, and treatment of acute complications. Needs Instruction    Patient understands prevention, detection, and treatment of chronic complications. Needs Instruction    Patient understands how to develop strategies to address psychosocial issues. Needs Instruction    Patient understands how to develop strategies to promote health/change behavior. Needs Instruction      Complications   Last HgB A1C per patient/outside source 6.9 %   04/18/2021   How often do you check your blood sugar? 1-2 times/day    Fasting Blood glucose range (mg/dL) 70-129    Number of hypoglycemic episodes per month 0    Number of hyperglycemic episodes per week 0    Have you had a dilated eye exam in the past 12 months? Yes    Have you had a dental exam in the past 12 months? No    Are you checking your feet? Yes    How many days per week are you checking your feet? 7      Dietary Intake  Breakfast none    Lunch 2 [employer]    Dinner 1 cup of salad    Beverage(s) Ocean spray cranapple, 5-6 bottles of water      Exercise   Exercise Type ADL's   Pt walks with a cane   How many days per week to you exercise? 0    How many minutes per day do you exercise? 0    Total minutes per week of exercise 0      Patient Education   Previous Diabetes Education No    Disease state  Factors that contribute to the development of diabetes    Nutrition management  Role of diet in the treatment of diabetes and the relationship between  the three main macronutrients and blood glucose level;Food label reading, portion sizes and measuring food.;Information on hints to eating out and maintain blood glucose control.    Physical activity and exercise  Role of exercise on diabetes management, blood pressure control and cardiac health.    Monitoring Purpose and frequency of SMBG.;Identified appropriate SMBG and/or A1C goals.;Daily foot exams    Chronic complications Assessed and discussed foot care and prevention of foot problems;Retinopathy and reason for yearly dilated eye exams    Psychosocial adjustment Identified and addressed patients feelings and concerns about diabetes;Helped patient identify a support system for diabetes management    Personal strategies to promote health Lifestyle issues that need to be addressed for better diabetes care   Eating consistent meals     Individualized Goals (developed by patient)   Nutrition Follow meal plan discussed    Physical Activity Not Applicable    Medications take my medication as prescribed    Monitoring  test my blood glucose as discussed    Reducing Risk do foot checks daily;increase portions of healthy fats      Post-Education Assessment   Patient understands the diabetes disease and treatment process. Needs Review    Patient understands incorporating nutritional management into lifestyle. Needs Review    Patient undertands incorporating physical activity into lifestyle. Needs Review    Patient understands using medications safely. Needs Review    Patient understands monitoring blood glucose, interpreting and using results Needs Review    Patient understands prevention, detection, and treatment of acute complications. Needs Review    Patient understands prevention, detection, and treatment of chronic complications. Needs Review    Patient understands how to develop strategies to address psychosocial issues. Needs Review    Patient understands how to develop strategies to promote  health/change behavior. Needs Review      Outcomes   Expected Outcomes Demonstrated interest in learning. Expect positive outcomes    Future DMSE 3-4 months    Program Status Not Completed             Individualized Plan for Diabetes Self-Management Training:   Learning Objective:  Patient will have a greater understanding of diabetes self-management. Patient education plan is to attend individual and/or group sessions per assessed needs and concerns.   Plan:   Patient Instructions  Start your day with a small breakfast of carbs and protein, then have a balanced snack a few hours later before your evening meal.  Begin to recognize carbohydrates in your food choices!  Begin to build your meals using the proportions of the Balanced Plate. First, select your carb choice(s) for the meal,  Next, select your source of protein to pair with your carb choice(s). Finally, complete the remaining half of your meal with a variety  of non-starchy vegetables.  Increase your physical activity at your comfort level. Every bit of movement helps!  Keep up the great work on your weight loss!  Expected Outcomes:  Demonstrated interest in learning. Expect positive outcomes  Education material provided: Food label handouts, My Plate, and Snack sheet  If problems or questions, patient to contact team via:  Phone and Email  Future DSME appointment: 3-4 months

## 2021-04-23 NOTE — Patient Instructions (Addendum)
Start your day with a small breakfast of carbs and protein, then have a balanced snack a few hours later before your evening meal.  Begin to recognize carbohydrates in your food choices!  Begin to build your meals using the proportions of the Balanced Plate. First, select your carb choice(s) for the meal,  Next, select your source of protein to pair with your carb choice(s). Finally, complete the remaining half of your meal with a variety of non-starchy vegetables.  Increase your physical activity at your comfort level. Every bit of movement helps!  Keep up the great work on your weight loss!

## 2021-04-29 ENCOUNTER — Ambulatory Visit (INDEPENDENT_AMBULATORY_CARE_PROVIDER_SITE_OTHER): Payer: Medicare Other | Admitting: Psychiatry

## 2021-04-29 DIAGNOSIS — F411 Generalized anxiety disorder: Secondary | ICD-10-CM

## 2021-04-29 NOTE — Progress Notes (Signed)
[facility] Counselor/Therapist Progress Note  Patient ID: [name], MRN: [id number],    Date: 04/29/2021  Time Spent: 50 minutes   Virtual Visit via Telephone Note:  Telephone Only Session (patient not able to do Video session) Connected with patient by a video enabled telemedicine/telehealth application or telephone, with their informed consent, and verified patient privacy and that I am speaking with the correct person using two identifiers. I discussed the limitations, risks, security and privacy concerns of performing psychotherapy and management service by telephone and the availability of in person appointments. I also discussed with the patient that there may be a patient responsible charge related to this service. The patient expressed understanding and agreed to proceed. I discussed the treatment planning with the patient. The patient was provided an opportunity to ask questions and all were answered. The patient agreed with the plan and demonstrated an understanding of the instructions. The patient was advised to call  our office if  symptoms worsen or feel they are in a crisis state and need immediate contact.   Therapist Location: [facility] Patient Location: home   Treatment Type: Individual Therapy  Reported Symptoms: anxiety, depression, some grief unresolved re: brother's death and prior grandbaby stillborn. Hard not to worry a lot. Denies any SI.  Mental Status Exam:  Appearance:   N/A   telehealth      Behavior:  Sharing and Motivated  Motor:  N/a   telehealth  Speech/Language:   Clear and Coherent  Affect:  N/A    telehealth  Mood:  anxious and depressed  Thought process:  goal directed  Thought content:    Some obsessiveness  Sensory/Perceptual disturbances:    WNL  Orientation:  oriented to person, place, time/date, situation, day of week, month of year, year, and stated date of Aug. 23, 2022  Attention:  Good  Concentration:  Good and  Fair  Memory:  WNL  Fund of knowledge:   Good  Insight:    Good  Judgment:   Good  Impulse Control:  Good   Risk Assessment: Danger to Self:  No Self-injurious Behavior: No Danger to Others: No Duty to Warn:no Physical Aggression / Violence:No  Access to Firearms a concern: No  Gang Involvement:No   Subjective: Patient today reporting anxiety, depression, worries a lot, physical issues increased recently retaining fluids, "bad cough" but not Covid, and is feeling better. Situation with oldest daughter has become more stressful and with their 95 yr old grandson.  Feels sad about situation with daughter and her " erratic outbursts" so they have to limit her coming to their home for safety purposes. Daughter has told patient that she's been diagnosed with "psychosis" but would share any more info with patient and wife. Worries about himself and physical concerns, his oldest daughter and her mental/emotional health instability,  and also what goes on in the world that is quite distressing." States distracting himself with talking with other people and playing video games, helps. States he is happiest when he watches movies with wife, sharing time with his other 2 daughters. Reports trying to interrupt himself when he realizes he is overthinking and focusing more on the negatives. Has been able to lose 30 lbs over past 4-5 months and "still working to lose more to improve my physical health." Multiple stressors in the home and with his oldest daughter "makes me feel so sad and helpless to make any difference, and stresses my wife too." Trying to "stay in  touch with family out of state and former [employer] buddies "that he has stayed in touch with.  Encouraged patient in keeping up with these contacts in addition to his successful weight loss strategies, relying on his faith which is very important to him, and having good boundaries with others as needed.  Also trying to work on decreasing his worrying and  not make assumptions that things will go in a negative direction even though they do sometimes, but also holding out hope for the positives that also sometimes occur.    Interventions: Cognitive Behavioral Therapy, Ego-Supportive, and Grief Therapy  Diagnosis:   ICD-10-CM   1. Generalized anxiety disorder  F41.1       Treatment Goals: Goals may remain on the tx plan as patient works on strategies to achieve goals.  Progress or lack of progress will be documented each session in "Progress" section on Plan. Long term goal: Develop the ability to recognize, accept, and cope with feelings of depression/anxiety. Short term goal: Identify and replace depressive/anxious thinking that leads to depressive/anxious feelings and actions. Strategy: Educate patient about cognitive restructuring, including self-monitoring of automatic thoughts, and working to interrupt his depressive/anxious thought patterns and replace with cognitive patterns that do not support depression or anxiety.    Plan: Patient today showing good motivation and does well in talking through his concerns, his stressors, his sadnesses and some collective grief regarding family situations.  (Not all details included in this note due to patient privacy needs.)  States that he tries to be realistic about family and extended family situations and sometimes is disheartening when things are going on with his adult children that he cannot control.  At times it creates the need for him to have really firm boundaries in place and he is able to do this.  Encouraged patient and his desire to continue some weight loss as he has done really well and his doctor is definitely very pleased with him over the last 3 to 4 months.  Also encouraged patient to practice more consistent positive self talk daily, intentionally look for the positives daily, follow his medical doctor's advice on his multiple medical issues, stay in the present rather than the  future or the past, focus on what he can control versus cannot, remain safe in following COVID precautions as he is not vaccinated, remain in contact with people who are supportive of him, and to recognize the strength he is showing as he works with his treatment goal plan in the midst of challenging circumstances in trying to move forward in a positive direction of improved emotional and physical health.  Goal review and progress/challenges noted with patient.  Next appointment within approximately 4-5 weeks.   Shanon Ace, LCSW

## 2021-04-29 NOTE — Progress Notes (Deleted)
[facility] Counselor/Therapist Progress Note  Patient ID: [name], MRN: [id number],    Date: 04/29/2021  Time Spent: 50 minutes   Virtual Visit via Telephone Note Connected with patient by a video enabled telemedicine/telehealth application or telephone, with their informed consent, and verified patient privacy and that I am speaking with the correct person using two identifiers. I discussed the limitations, risks, security and privacy concerns of performing psychotherapy and management service by telephone and the availability of in person appointments. I also discussed with the patient that there may be a patient responsible charge related to this service. The patient expressed understanding and agreed to proceed. I discussed the treatment planning with the patient. The patient was provided an opportunity to ask questions and all were answered. The patient agreed with the plan and demonstrated an understanding of the instructions. The patient was advised to call  our office if  symptoms worsen or feel they are in a crisis state and need immediate contact.   Therapist Location: [facility] Patient Location: home   Treatment Type: Individual Therapy  Reported Symptoms:     grief (family losses and serious BH issues with a family member), anxiety, depression, "worries a lot" about other family members; "physically trying to follow instructions of my doctors and have to push myself to move around more and once I do, then I do feel better."  Did not get vaccinated for Covid so "fearful about getting it but it was my choice to not get vaccinated    Mental Status Exam:  Appearance:   {PSY:22683}     Behavior:  {PSY:21022743}  Motor:  {PSY:22302}  Speech/Language:   {PSY:22685}  Affect:  {PSY:22687}  Mood:  {PSY:31886}  Thought process:  {PSY:31888}  Thought content:    {[phone]}  Sensory/Perceptual disturbances:    {PSY:[phone]}  Orientation:   {PSY:30297}  Attention:  {PSY:22877}  Concentration:  {PSY:4581857260}  Memory:  {PSY:505 811 3502}  Fund of knowledge:   {PSY:4581857260}  Insight:    {PSY:4581857260}  Judgment:   {PSY:4581857260}  Impulse Control:  {PSY:4581857260}   Risk Assessment: Danger to Self:  {PSY:22692} Self-injurious Behavior: {PSY:22692} Danger to Others: {PSY:22692} Duty to Warn:{PSY:311194} Physical Aggression / Violence:{PSY:21197} Access to Firearms a concern: {PSY:21197} Gang Involvement:{PSY:21197}  Subjective: ***  Patient today reporting anxiety, depression,    Patient today reporting anxiety, depression, and "worrying about family and self  and the world with so many things going on." Trying not to overthink as much and am still working on that. Needed session today to focus on and receive support in the family losses and some current concerns with other family member (physical and behavioral health).  Patient processing grief and concern and other family members who are currently unstable emotionally but feels they are getting some BH treatment.  Patient not reporting any further weight loss since last appointment and he is encouraged to follow through on doctor recommendations regarding that.  Dealing with cumulative losses within the family, and is appreciative today to be able to vent and talk through thoughts and feelings "as I tend to be the rock in the family when things go wrong."  Working to decrease his "worrying" as that tends to lead him to negative or case scenarios and discussed ways with him of reducing his worrying and hold the thought that things may go in a more positive direction than what he is assuming and look more towards the positive, which patient agreed to work on between sessions and feels  that if he can make that switch it would be helpful.    Interventions: {[phone]}  Diagnosis:No diagnosis found.  Treatment Goals: Goals may remain on the tx plan as patient works  on strategies to achieve goals.  Progress or lack of progress will be documented each session in "Progress" section on Plan. Long term goal: Develop the ability to recognize, accept, and cope with feelings of depression/anxiety. Short term goal: Identify and replace depressive/anxious thinking that leads to depressive/anxious feelings and actions. Strategy: Educate patient about cognitive restructuring, including self-monitoring of automatic thoughts, and working to interrupt his depressive/anxious thought patterns and replace with cognitive patterns that do not support depression or anxiety.    Plan: *** Patient today showing    Patient today showing good motivation in talking through his various concerns especially family grief and concern about family members with significant mental health issues including one who remains hospitalized at this point.  Patient feels it has helped him to be able to talk through these concerns and receive support today.  Also able to realize when he needs to set limits with family as he tends to be their emotional support person and it can be "a lot to deal with sometimes."  Totally supported his setting limits and he was able to understand that setting limits does not mean that his caring for family is any less, but rather means that his own self-care is very important as well.  Encouraged patient and following his medical doctor's advice, also to intentionally look for more positives daily, to stay in the present rather than the future, to practice positive self talk daily, to focus on what he can control versus cannot, to remain safe in following COVID precautions as he is not vaccinated, remain in contact with people who are supportive of him, and to feel good about the strength he is showing and working on his treatment goal plan while in the midst of challenging circumstances as he tries to move forward in a positive direction towards the best possible emotional  and physical health for him.   Goal review and progress/challenges noted with patient.  Next appointment within approximately 4 weeks.   Shanon Ace, LCSW

## 2021-04-29 NOTE — Progress Notes (Signed)
[facility] Counselor/Therapist Progress Note  Patient ID: ELNATAN HAAKE, MRN: [id number],    Date: 04/29/2021  Time Spent: 50 minutes   Virtual Visit via Telephone Note:  Telephone Only Session (patient not able to do Video session) Connected with patient by a video enabled telemedicine/telehealth application or telephone, with their informed consent, and verified patient privacy and that I am speaking with the correct person using two identifiers. I discussed the limitations, risks, security and privacy concerns of performing psychotherapy and management service by telephone and the availability of in person appointments. I also discussed with the patient that there may be a patient responsible charge related to this service. The patient expressed understanding and agreed to proceed. I discussed the treatment planning with the patient. The patient was provided an opportunity to ask questions and all were answered. The patient agreed with the plan and demonstrated an understanding of the instructions. The patient was advised to call  our office if  symptoms worsen or feel they are in a crisis state and need immediate contact.   Therapist Location: [facility] Patient Location: home   Treatment Type: Individual Therapy  Reported Symptoms: anxiety, depression, some grief unresolved re: brother's death and prior grandbaby stillborn. Hard not to worry a lot. Denies any SI.  Mental Status Exam:  Appearance:   N/A   telehealth      Behavior:  Sharing and Motivated  Motor:  N/a   telehealth  Speech/Language:   Clear and Coherent  Affect:  N/A    telehealth  Mood:  anxious and depressed  Thought process:  goal directed  Thought content:    Some obsessiveness  Sensory/Perceptual disturbances:    WNL  Orientation:  oriented to person, place, time/date, situation, day of week, month of year, year, and stated date of Aug. 23, 2022  Attention:  Good  Concentration:  Good and  Fair  Memory:  WNL  Fund of knowledge:   Good  Insight:    Good  Judgment:   Good  Impulse Control:  Good   Risk Assessment: Danger to Self:  No Self-injurious Behavior: No Danger to Others: No Duty to Warn:no Physical Aggression / Violence:No  Access to Firearms a concern: No  Gang Involvement:No   Subjective: Patient today reporting anxiety, depression, worries a lot, physical issues increased recently retaining fluids, "bad cough" but not Covid, and is feeling better. Situation with oldest daughter has become more stressful and with their 60 yr old grandson.  Feels sad about situation with daughter and her " erratic outbursts" so they have to limit her coming to their home for safety purposes. Daughter has told patient that she's been diagnosed with "psychosis" but would share any more info with patient and wife. Worries about himself and physical concerns, his oldest daughter and her mental/emotional health instability,  and also what goes on in the world that is quite distressing." States distracting himself with talking with other people and playing video games, helps. States he is happiest when he watches movies with wife, sharing time with his other 2 daughters. Reports trying to interrupt himself when he realizes he is overthinking and focusing more on the negatives. Has been able to lose 30 lbs over past 4-5 months and "still working to lose more to improve my physical health." Multiple stressors in the home and with his oldest daughter "makes me feel so sad and helpless to make any difference, and stresses my wife too." Trying to "stay in  touch with family out of state and former [employer] buddies "that he has stayed in touch with.  Encouraged patient in keeping up with these contacts in addition to his successful weight loss strategies, relying on his faith which is very important to him, and having good boundaries with others as needed.  Also trying to work on decreasing his worrying and  not make assumptions that things will go in a negative direction even though they do sometimes, but also holding out hope for the positives that also sometimes occur.    Interventions: Cognitive Behavioral Therapy, Ego-Supportive, and Grief Therapy  Diagnosis:   ICD-10-CM   1. Generalized anxiety disorder  F41.1       Treatment Goals: Goals may remain on the tx plan as patient works on strategies to achieve goals.  Progress or lack of progress will be documented each session in "Progress" section on Plan. Long term goal: Develop the ability to recognize, accept, and cope with feelings of depression/anxiety. Short term goal: Identify and replace depressive/anxious thinking that leads to depressive/anxious feelings and actions. Strategy: Educate patient about cognitive restructuring, including self-monitoring of automatic thoughts, and working to interrupt his depressive/anxious thought patterns and replace with cognitive patterns that do not support depression or anxiety.    Plan: Patient today showing good motivation and does well in talking through his concerns, his stressors, his sadnesses and some collective grief regarding family situations.  (Not all details included in this note due to patient privacy needs.)  States that he tries to be realistic about family and extended family situations and sometimes is disheartening when things are going on with his adult children that he cannot control.  At times it creates the need for him to have really firm boundaries in place and he is able to do this.  Encouraged patient and his desire to continue some weight loss as he has done really well and his doctor is definitely very pleased with him over the last 3 to 4 months.  Also encouraged patient to practice more consistent positive self talk daily, intentionally look for the positives daily, follow his medical doctor's advice on his multiple medical issues, stay in the present rather than the  future or the past, focus on what he can control versus cannot, remain safe in following COVID precautions as he is not vaccinated, remain in contact with people who are supportive of him, and to recognize the strength he is showing as he works with his treatment goal plan in the midst of challenging circumstances in trying to move forward in a positive direction of improved emotional and physical health.  Goal review and progress/challenges noted with patient.  Next appointment within approximately 4-5 weeks.   Shanon Ace, LCSW

## 2021-04-29 NOTE — Progress Notes (Signed)
[facility] Counselor/Therapist Progress Note  Patient ID: [name], MRN: [id number],    Date: 04/29/2021  Time Spent: 50 minutes   Virtual Visit via Telephone Note:  Telephone Only Session (patient not able to do Video session) Connected with patient by a video enabled telemedicine/telehealth application or telephone, with their informed consent, and verified patient privacy and that I am speaking with the correct person using two identifiers. I discussed the limitations, risks, security and privacy concerns of performing psychotherapy and management service by telephone and the availability of in person appointments. I also discussed with the patient that there may be a patient responsible charge related to this service. The patient expressed understanding and agreed to proceed. I discussed the treatment planning with the patient. The patient was provided an opportunity to ask questions and all were answered. The patient agreed with the plan and demonstrated an understanding of the instructions. The patient was advised to call  our office if  symptoms worsen or feel they are in a crisis state and need immediate contact.   Therapist Location: [facility] Patient Location: home   Treatment Type: Individual Therapy  Reported Symptoms: anxiety, depression, some grief unresolved re: brother's death and prior grandbaby stillborn. Hard not to worry a lot. Denies any SI.  Mental Status Exam:  Appearance:   N/A   telehealth      Behavior:  Sharing and Motivated  Motor:  N/a   telehealth  Speech/Language:   Clear and Coherent  Affect:  N/A    telehealth  Mood:  anxious and depressed  Thought process:  goal directed  Thought content:    Some obsessiveness  Sensory/Perceptual disturbances:    WNL  Orientation:  oriented to person, place, time/date, situation, day of week, month of year, year, and stated date of Aug. 23, 2022  Attention:  Good  Concentration:  Good and  Fair  Memory:  WNL  Fund of knowledge:   Good  Insight:    Good  Judgment:   Good  Impulse Control:  Good   Risk Assessment: Danger to Self:  No Self-injurious Behavior: No Danger to Others: No Duty to Warn:no Physical Aggression / Violence:No  Access to Firearms a concern: No  Gang Involvement:No   Subjective: Patient today reporting anxiety, depression, worries a lot, physical issues increased recently retaining fluids, "bad cough" but not Covid, and is feeling better. Situation with oldest daughter has become more stressful and with their 91 yr old grandson.  Feels sad about situation with daughter and her " erratic outbursts" so they have to limit her coming to their home for safety purposes. Daughter has told patient that she's been diagnosed with "psychosis" but would share any more info with patient and wife. Worries about himself and physical concerns, his oldest daughter and her mental/emotional health instability,  and also what goes on in the world that is quite distressing." States distracting himself with talking with other people and playing video games, helps. States he is happiest when he watches movies with wife, sharing time with his other 2 daughters. Reports trying to interrupt himself when he realizes he is overthinking and focusing more on the negatives. Has been able to lose 30 lbs over past 4-5 months and "still working to lose more to improve my physical health." Multiple stressors in the home and with his oldest daughter "makes me feel so sad and helpless to make any difference, and stresses my wife too." Trying to "stay in  touch with family out of state and former [employer] buddies "that he has stayed in touch with.  Encouraged patient in keeping up with these contacts in addition to his successful weight loss strategies, relying on his faith which is very important to him, and having good boundaries with others as needed.  Also trying to work on decreasing his worrying and  not make assumptions that things will go in a negative direction even though they do sometimes, but also holding out hope for the positives that also sometimes occur.    Interventions: Cognitive Behavioral Therapy, Ego-Supportive, and Grief Therapy  Diagnosis:   ICD-10-CM   1. Generalized anxiety disorder  F41.1       Treatment Goals: Goals may remain on the tx plan as patient works on strategies to achieve goals.  Progress or lack of progress will be documented each session in "Progress" section on Plan. Long term goal: Develop the ability to recognize, accept, and cope with feelings of depression/anxiety. Short term goal: Identify and replace depressive/anxious thinking that leads to depressive/anxious feelings and actions. Strategy: Educate patient about cognitive restructuring, including self-monitoring of automatic thoughts, and working to interrupt his depressive/anxious thought patterns and replace with cognitive patterns that do not support depression or anxiety.    Plan: Patient today showing good motivation and does well in talking through his concerns, his stressors, his sadnesses and some collective grief regarding family situations.  (Not all details included in this note due to patient privacy needs.)  States that he tries to be realistic about family and extended family situations and sometimes is disheartening when things are going on with his adult children that he cannot control.  At times it creates the need for him to have really firm boundaries in place and he is able to do this.  Encouraged patient and his desire to continue some weight loss as he has done really well and his doctor is definitely very pleased with him over the last 3 to 4 months.  Also encouraged patient to practice more consistent positive self talk daily, intentionally look for the positives daily, follow his medical doctor's advice on his multiple medical issues, stay in the present rather than the  future or the past, focus on what he can control versus cannot, remain safe in following COVID precautions as he is not vaccinated, remain in contact with people who are supportive of him, and to recognize the strength he is showing as he works with his treatment goal plan in the midst of challenging circumstances in trying to move forward in a positive direction of improved emotional and physical health.  Goal review and progress/challenges noted with patient.  Next appointment within approximately 4-5 weeks.   Shanon Ace, LCSW

## 2021-05-15 ENCOUNTER — Telehealth: Payer: Self-pay

## 2021-05-15 MED ORDER — GLUCOSE BLOOD VI STRP: ORAL_STRIP | 12 refills | Status: DC

## 2021-05-15 MED ORDER — ONETOUCH VERIO FLEX SYSTEM W/DEVICE KIT: PACK | 3 refills | Status: DC

## 2021-05-15 NOTE — Telephone Encounter (Signed)
Called and spoke with pt wife and one touch verio flex meter and strips sent to pharmacy.

## 2021-05-15 NOTE — Telephone Encounter (Signed)
Pt's wife called regarding test strips and a new machine. She stated that it is not under insurance and would like a new machine called in. Please Advise.

## 2021-05-16 ENCOUNTER — Telehealth: Payer: Self-pay

## 2021-05-16 ENCOUNTER — Other Ambulatory Visit: Payer: Self-pay | Admitting: Family Medicine

## 2021-05-16 NOTE — Telephone Encounter (Signed)
Pts wife calling requesting samples of entresto.

## 2021-05-19 NOTE — Telephone Encounter (Signed)
Patient calling the office for samples of medication:   1.  What medication and dosage are you requesting samples for? Entresto   2.  Are you currently out of this medication? He has been out since last Thursday   Best number [phone]   Wife is call back in to check on the samples

## 2021-05-19 NOTE — Telephone Encounter (Signed)
Noted. I was asking if okay to give samples of the 49-51 mg BID since patient has been out and they are in a emergent situation was unsure if this was okay to do- will ask pharmacy at [place] to assess. Patient normally does not have issues but this month had an emergent situation occur and needs help for about another week.  Thank you!

## 2021-05-19 NOTE — Telephone Encounter (Signed)
[name]  I have a bottle of Entresto you can give out to [name].

## 2021-05-19 NOTE — Telephone Encounter (Signed)
Called patient, advised of samples up front.  Wife verbalized understanding.

## 2021-05-19 NOTE — Telephone Encounter (Signed)
Called patient wife- she states that they normally have no issues getting the medication, but they had something emergent come up and was unable to afford it this month- they stated they just need about 1-2 weeks worth, I did advise they did not make a 97-103 dose of samples, but she states that he was given the 49-51 mg samples twice before- I advised I would route to pharmacy to get the okay.   Thanks!

## 2021-05-19 NOTE — Telephone Encounter (Signed)
Just the anticoag sample requests need to come to the PharmD, not other meds. Not sure what sample supply of Entresto is like at [facility] but we typically don't have many at [facility] and 1 bottle of samples would last just 1 week if he had to double up and take 2 of the 49-'51mg'$  tabs BID. Will defer to [facility] staff who can assess sample supply. If cost is a recurrent issue, he should apply for pt assistance through [employer].

## 2021-05-27 ENCOUNTER — Ambulatory Visit: Payer: Medicare Other | Admitting: Family Medicine

## 2021-06-05 ENCOUNTER — Telehealth: Payer: Self-pay

## 2021-06-05 MED ORDER — OMEPRAZOLE 40 MG PO CPDR: DELAYED_RELEASE_CAPSULE | ORAL | 5 refills | Status: DC

## 2021-06-05 NOTE — Telephone Encounter (Signed)
MEDICATION: omeprazole (PRILOSEC) 20 MG capsule  PHARMACY:  [id number] [place],  - [address] Phone:  [phone]  Fax:  [phone]      Comments:   **Let patient know to contact pharmacy at the end of the day to make sure medication is ready. **  ** Please notify patient to allow 48-72 hours to process**  **Encourage patient to contact the pharmacy for refills or they can request refills through [facility]** `

## 2021-06-05 NOTE — Telephone Encounter (Signed)
Medication sent to pharmacy  

## 2021-06-06 ENCOUNTER — Other Ambulatory Visit: Payer: Self-pay | Admitting: Cardiology

## 2021-06-06 ENCOUNTER — Telehealth: Payer: Self-pay | Admitting: Cardiology

## 2021-06-06 ENCOUNTER — Other Ambulatory Visit: Payer: Self-pay | Admitting: Family Medicine

## 2021-06-06 NOTE — Telephone Encounter (Signed)
*  STAT* If patient is at the pharmacy, call can be transferred to refill team.   1. Which medications need to be refilled? (please list name of each medication and dose if known)  sacubitril-valsartan (ENTRESTO) 97-103 MG  2. Which pharmacy/location (including street and city if local pharmacy) is medication to be sent to? [id number] - [place], [place] - [address]  3. Do they need a 30 day or 90 day supply? 90 with refills

## 2021-06-10 ENCOUNTER — Ambulatory Visit: Payer: Medicare Other | Admitting: Psychiatry

## 2021-06-16 ENCOUNTER — Ambulatory Visit (INDEPENDENT_AMBULATORY_CARE_PROVIDER_SITE_OTHER): Payer: Medicare Other | Admitting: Psychiatry

## 2021-06-16 DIAGNOSIS — F411 Generalized anxiety disorder: Secondary | ICD-10-CM | POA: Diagnosis not present

## 2021-06-16 NOTE — Progress Notes (Signed)
[facility] Counselor/Therapist Progress Note  Patient ID: ELL TISO, MRN: [id number],    Date: 06/16/2021  Time Spent: 50 minutes     Virtual Visit via Telephone Note:  Telephone only session as patient unable to do video session today. Connected with patient by a telemedicine/telehealth application, with their informed consent, and verified patient privacy and that I am speaking with the correct person using two identifiers. I discussed the limitations, risks, security and privacy concerns of performing psychotherapy and the availability of in person appointments. I also discussed with the patient that there may be a patient responsible charge related to this service. The patient expressed understanding and agreed to proceed. I discussed the treatment planning with the patient. The patient was provided an opportunity to ask questions and all were answered. The patient agreed with the plan and demonstrated an understanding of the instructions. The patient was advised to call  our office if  symptoms worsen or feel they are in a crisis state and need immediate contact.   Therapist Location: [facility] Patient Location: home   Treatment Type: Individual Therapy  Reported Symptoms: anxiety (which he reports as strongest symptom) and depression re: family and health issues, Reports having new PCP since "about March 2022, Dr. Garret Reddish with [place] at [place]"  Mental Status Exam:  Appearance:   N/a   telehealth      Behavior:  Sharing and Motivated  Motor:  Mobility difficult due to knee issues, heart issues, breathing issues and being followed by his Drs for these issues  Speech/Language:   Clear and Coherent  Affect:  N/a   telehealth  Mood:  anxious and depressed  Thought process:  goal directed  Thought content:    Overthinking "a lot"  Sensory/Perceptual disturbances:    WNL  Orientation:  oriented to person, place, time/date, situation,  day of week, month of year, year, and stated date of Oct. 10, 2022  Attention:  Good  Concentration:  Good  Memory:  Some short term memory issues and he is sharing with Dr.  Huey Romans of knowledge:   Good  Insight:    Good and Fair  Judgment:   Good  Impulse Control:  Good   Risk Assessment: Danger to Self:  No Self-injurious Behavior: No Danger to Others: No Duty to Warn:no Physical Aggression / Violence:No  Access to Firearms a concern: No  Gang Involvement:No   Subjective:  Patient today reporting anxiety and depression mostly related to family and patient's health issues.  Denies any SI. Wife is supportive.  Talks openly today re: family situations that "worry" patient and he continues to work to decrease his anxiety "but it's difficult".  Discussed today how trying to set limits with adult daughter who tends to create difficult circumstances for patient has not been very effective.  Looked at making some changes as to how he responds to daughter's "argumentative" approach, by choosing to not react in ways that may be feeding daughter's behavior, and he admitted that when he says less it tends to go better." Also processed some current feelings he is having about the current war crisis in [place], in light of his [employer] service in the past. Is sleeping with his Trazedone but some grogginess upon awakening on mornings when he has to wake up earlier; when he sleeps later the grogginess is not a problem. Enjoys time spent with wife and other family, grandkids, playing video games, talking with other veterans he  has known over the years. Tries to catch himself when he gets in negative thought patterns and interrupt them, changing them to be more realistic and empowering. Has maintained 24lbs of 30lbs weight loss over past few months. Encouraged patient to take meds as prescribed, continue to use healthy boundaries with oldest daughter, focusing more on healthy eating and staying in touch with his  PCP, and relying on his faith which is "a big help for me."   Interventions: Solution-Oriented/Positive Psychology, Ego-Supportive, and Insight-Oriented  Diagnosis:   ICD-10-CM   1. Generalized anxiety disorder  F41.1        Treatment Goals: Goals may remain on the tx plan as patient works on strategies to achieve goals.  Progress or lack of progress will be documented each session in "Progress" section on Plan. Long term goal: Develop the ability to recognize, accept, and cope with feelings of depression/anxiety. Short term goal: Identify and replace depressive/anxious thinking that leads to depressive/anxious feelings and actions. Strategy: Educate patient about cognitive restructuring, including self-monitoring of automatic thoughts, and working to interrupt his depressive/anxious thought patterns and replace with cognitive patterns that do not support depression or anxiety.    Plan:  Patient today showing good motivation and openly talk through his concerns regarding family issues that continue as well as his health concerns that have not really improved.  Continues to have some collective grief issues regarding family struggles, and friends that have died.  Working to maintain healthy boundaries with one of his adult daughters due to her verbal encounters with him and wife.  Encouraged patient in practicing more positive behaviors that have helped him previously including: Intentionally looking for the positives each day, following his medical doctor's advice on his multiple medical issues and taking medications as prescribed, staying in the present rather than the future or the past, focus on what he can control versus cannot, consistent positive self talk, remaining safe as he follows COVID precautions as he is not vaccinated, remaining in contact with people who are supportive of him, letting his faith be a resource for his emotional health as well as spiritual, and recognizing the  strength he is showing as he works with goal-directed behaviors to move forward and a direction that supports improved emotional and physical health.   Goal review and progress/challenges noted with patient.  Next appointment within 3 to 4 weeks.  This record has been created using [employer].  Chart creation errors have been sought, but may not always have been located and corrected.  Such creation errors do not reflect on the standard of medical care provided.    Shanon Ace, LCSW
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
## 2021-06-26 ENCOUNTER — Ambulatory Visit (INDEPENDENT_AMBULATORY_CARE_PROVIDER_SITE_OTHER): Payer: Medicare Other | Admitting: Cardiology

## 2021-06-26 ENCOUNTER — Other Ambulatory Visit: Payer: Self-pay

## 2021-06-26 ENCOUNTER — Encounter: Payer: Self-pay | Admitting: Cardiology

## 2021-06-26 DIAGNOSIS — I420 Dilated cardiomyopathy: Secondary | ICD-10-CM

## 2021-06-26 DIAGNOSIS — I509 Heart failure, unspecified: Secondary | ICD-10-CM

## 2021-06-26 DIAGNOSIS — I10 Essential (primary) hypertension: Secondary | ICD-10-CM

## 2021-06-26 DIAGNOSIS — E7800 Pure hypercholesterolemia, unspecified: Secondary | ICD-10-CM

## 2021-06-26 MED ORDER — CARVEDILOL 12.5 MG PO TABS: 12.5000 mg | ORAL_TABLET | Freq: Two times a day (BID) | ORAL | 3 refills | Status: DC

## 2021-06-26 MED ORDER — SPIRONOLACTONE 25 MG PO TABS: 12.5000 mg | ORAL_TABLET | Freq: Every day | ORAL | 3 refills | Status: DC

## 2021-06-26 NOTE — Patient Instructions (Signed)
Medication Instructions:   INCREASE CARVEDILOL TO 12.5 MG ONE TABLET TWICE DAILY= 2 OF THE 6.25 MG TABLETS TWICE DAILY  START SPIRONOLACTONE 12.5 MG ONCE DAILY= 1/2 OF THE 25 MG TABLET ONCE DAILY  *If you need a refill on your cardiac medications before your next appointment, please call your pharmacy*   Lab Work:  Your physician recommends that you return for lab work in: [place]  If you have labs (blood work) drawn today and your tests are completely normal, you will receive your results only by: [employer] (if you have MyChart) OR A paper copy in the mail If you have any lab test that is abnormal or we need to change your treatment, we will call you to review the results.   Testing/Procedures:  Your physician has requested that you have an echocardiogram. Echocardiography is a painless test that uses sound waves to create images of your heart. It provides your doctor with information about the size and shape of your heart and how well your heart's chambers and valves are working. This procedure takes approximately one hour. There are no restrictions for this procedure. [place]   Follow-Up: At [facility], you and your health needs are our priority.  As part of our continuing mission to provide you with exceptional heart care, we have created designated Provider Care Teams.  These Care Teams include your primary Cardiologist (physician) and Advanced Practice Providers (APPs -  Physician Assistants and Nurse Practitioners) who all work together to provide you with the care you need, when you need it.  We recommend signing up for the patient portal called "MyChart".  Sign up information is provided on this After Visit Summary.  MyChart is used to connect with patients for Virtual Visits (Telemedicine).  Patients are able to view lab/test results, encounter notes, upcoming appointments, etc.  Non-urgent messages can be sent to your provider as well.   To learn  more about what you can do with MyChart, go to [email or web address].    Your next appointment:   12 month(s)  The format for your next appointment:   In Person  Provider:   Kirk Ruths, MD

## 2021-07-01 ENCOUNTER — Ambulatory Visit: Payer: Medicare Other | Admitting: Dietician
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
## 2021-07-14 ENCOUNTER — Encounter: Payer: Self-pay | Admitting: Internal Medicine

## 2021-07-14 ENCOUNTER — Ambulatory Visit (INDEPENDENT_AMBULATORY_CARE_PROVIDER_SITE_OTHER): Payer: Medicare Other | Admitting: Adult Health

## 2021-07-14 ENCOUNTER — Encounter: Payer: Self-pay | Admitting: Adult Health

## 2021-07-14 VITALS — BP 150/85 | HR 81 | Ht 71.0 in | Wt 310.0 lb

## 2021-07-14 DIAGNOSIS — Z9989 Dependence on other enabling machines and devices: Secondary | ICD-10-CM | POA: Diagnosis not present

## 2021-07-14 DIAGNOSIS — G4733 Obstructive sleep apnea (adult) (pediatric): Secondary | ICD-10-CM | POA: Diagnosis not present

## 2021-07-15 ENCOUNTER — Other Ambulatory Visit: Payer: Self-pay

## 2021-07-15 ENCOUNTER — Ambulatory Visit (HOSPITAL_COMMUNITY): Payer: Medicare Other | Attending: Cardiology

## 2021-07-15 DIAGNOSIS — I5042 Chronic combined systolic (congestive) and diastolic (congestive) heart failure: Secondary | ICD-10-CM

## 2021-07-15 DIAGNOSIS — I420 Dilated cardiomyopathy: Secondary | ICD-10-CM | POA: Diagnosis not present

## 2021-07-15 DIAGNOSIS — I509 Heart failure, unspecified: Secondary | ICD-10-CM | POA: Diagnosis not present

## 2021-07-15 LAB — ECHOCARDIOGRAM COMPLETE
Area-P 1/2: 3.88 cm2
S' Lateral: 4.4 cm

## 2021-08-05 ENCOUNTER — Telehealth: Payer: Self-pay | Admitting: *Deleted

## 2021-08-05 DIAGNOSIS — G4733 Obstructive sleep apnea (adult) (pediatric): Secondary | ICD-10-CM

## 2021-08-05 DIAGNOSIS — Z9989 Dependence on other enabling machines and devices: Secondary | ICD-10-CM

## 2021-08-05 NOTE — Telephone Encounter (Signed)
Pt's wife Ebony Hail (on [place]) requested order for BIPAP supplies. She also requested to switch pt's DME company to [facility] she will be using. She is dissatisfied with [employer]. I have sent the orders there.

## 2021-08-06 ENCOUNTER — Encounter: Payer: Self-pay | Admitting: *Deleted

## 2021-08-07 ENCOUNTER — Ambulatory Visit: Payer: Medicare Other | Admitting: Psychiatry

## 2021-08-07 NOTE — Telephone Encounter (Signed)
[facility] has confirmed receipt of the order.

## 2021-08-13 ENCOUNTER — Ambulatory Visit: Payer: Medicare Other | Admitting: Dietician

## 2021-08-13 DIAGNOSIS — I10 Essential (primary) hypertension: Secondary | ICD-10-CM | POA: Diagnosis not present

## 2021-08-13 LAB — BASIC METABOLIC PANEL
BUN/Creatinine Ratio: 13 (ref 9–20)
BUN: 14 mg/dL (ref 6–24)
CO2: 24 mmol/L (ref 20–29)
Calcium: 9.3 mg/dL (ref 8.7–10.2)
Chloride: 102 mmol/L (ref 96–106)
Creatinine, Ser: 1.12 mg/dL (ref 0.76–1.27)
Glucose: 169 mg/dL — ABNORMAL HIGH (ref 70–99)
Potassium: 4.4 mmol/L (ref 3.5–5.2)
Sodium: 139 mmol/L (ref 134–144)
eGFR: 76 mL/min/{1.73_m2} (ref >59)

## 2021-08-21 ENCOUNTER — Encounter: Payer: Self-pay | Admitting: Internal Medicine

## 2021-08-21 ENCOUNTER — Ambulatory Visit (INDEPENDENT_AMBULATORY_CARE_PROVIDER_SITE_OTHER): Payer: Medicare Other | Admitting: Internal Medicine

## 2021-08-21 VITALS — BP 106/78 | HR 80 | Ht 69.75 in | Wt 301.1 lb

## 2021-08-21 DIAGNOSIS — E119 Type 2 diabetes mellitus without complications: Secondary | ICD-10-CM | POA: Diagnosis not present

## 2021-08-21 DIAGNOSIS — Z8679 Personal history of other diseases of the circulatory system: Secondary | ICD-10-CM | POA: Diagnosis not present

## 2021-08-21 DIAGNOSIS — Z86010 Personal history of colonic polyps: Secondary | ICD-10-CM

## 2021-08-21 DIAGNOSIS — K219 Gastro-esophageal reflux disease without esophagitis: Secondary | ICD-10-CM | POA: Diagnosis not present

## 2021-08-21 MED ORDER — SUTAB 1479-225-188 MG PO TABS: 1.0000 | ORAL_TABLET | Freq: Once | ORAL | 0 refills | Status: AC

## 2021-08-21 MED ORDER — OMEPRAZOLE 40 MG PO CPDR: 40.0000 mg | DELAYED_RELEASE_CAPSULE | Freq: Every day | ORAL | 3 refills | Status: DC
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
## 2021-08-21 NOTE — Patient Instructions (Signed)
If you are age 58 or older, your body mass index should be between 23-30. Your Body mass index is 43.52 kg/m. If this is out of the aforementioned range listed, please consider follow up with your Primary Care Provider.  If you are age 47 or younger, your body mass index should be between 19-25. Your Body mass index is 43.52 kg/m. If this is out of the aformentioned range listed, please consider follow up with your Primary Care Provider.   ________________________________________________________  The [facility] providers would like to encourage you to use [facility] to communicate with providers for non-urgent requests or questions.  Due to long hold times on the telephone, sending your provider a message by [facility] may be a faster and more efficient way to get a response.  Please allow 48 business hours for a response.  Please remember that this is for non-urgent requests.  _______________________________________________________  We have sent the following medications to your pharmacy for you to pick up at your convenience:  Omeprazole  You have been scheduled for a colonoscopy. Please follow written instructions given to you at your visit today.  Please pick up your prep supplies at the pharmacy within the next 1-3 days. If you use inhalers (even only as needed), please bring them with you on the day of your procedure.

## 2021-09-15 ENCOUNTER — Encounter: Payer: Self-pay | Admitting: Family Medicine

## 2021-09-15 ENCOUNTER — Telehealth (INDEPENDENT_AMBULATORY_CARE_PROVIDER_SITE_OTHER): Payer: Medicare Other | Admitting: Family Medicine

## 2021-09-15 ENCOUNTER — Other Ambulatory Visit: Payer: Self-pay | Admitting: Cardiology

## 2021-09-15 VITALS — BP 116/78 | HR 97 | Temp 97.1°F | Ht 69.75 in | Wt 301.0 lb

## 2021-09-15 DIAGNOSIS — E119 Type 2 diabetes mellitus without complications: Secondary | ICD-10-CM | POA: Diagnosis not present

## 2021-09-15 DIAGNOSIS — I5042 Chronic combined systolic (congestive) and diastolic (congestive) heart failure: Secondary | ICD-10-CM

## 2021-09-15 DIAGNOSIS — J4540 Moderate persistent asthma, uncomplicated: Secondary | ICD-10-CM | POA: Diagnosis not present

## 2021-09-15 DIAGNOSIS — I10 Essential (primary) hypertension: Secondary | ICD-10-CM | POA: Diagnosis not present

## 2021-09-15 DIAGNOSIS — U071 COVID-19: Secondary | ICD-10-CM | POA: Diagnosis not present

## 2021-09-15 MED ORDER — MOLNUPIRAVIR EUA 200MG CAPSULE: 4.0000 | ORAL_CAPSULE | Freq: Two times a day (BID) | ORAL | 0 refills | Status: AC

## 2021-09-15 NOTE — Progress Notes (Signed)
Phone [phone] Virtual visit via Video note   Subjective:  Chief complaint: Chief Complaint  Patient presents with   Covid Positive    Pt c/o chills, runny nose, body/muscle aches, cough and headache since last Friday. He tested positive Sunday morning, pt denies fever. Pt has no appetite, he has not eaten since Friday.     This visit type was conducted due to national recommendations for restrictions regarding the COVID-19 Pandemic (e.g. social distancing).  This format is felt to be most appropriate for this patient at this time balancing risks to patient and risks to population by having him in for in person visit.  No physical exam was performed (except for noted visual exam or audio findings with Telehealth visits).    Our team/I connected with [name] at 11:20 AM EST by a video enabled telemedicine application (doxy.me or caregility through epic) and verified that I am speaking with the correct person using two identifiers.  Location patient: [facility] Location provider: [facility], office Persons participating in the virtual visit:  patient  Our team/I discussed the limitations of evaluation and management by telemedicine and the availability of in person appointments. In light of current covid-19 pandemic, patient also understands that we are trying to protect them by minimizing in office contact if at all possible.  The patient expressed consent for telemedicine visit and agreed to proceed. Patient understands insurance will be billed.   Past Medical History-  Patient Active Problem List   Diagnosis Date Noted   Diabetes mellitus without complication ([place]) 75/91/6384    Priority: High   NICM (nonischemic cardiomyopathy) ([place])     Priority: High   Osteoarthritis, knee 11/19/2020    Priority: Medium    Asthma 11/19/2020    Priority: Medium    GAD (generalized anxiety disorder) 11/19/2020    Priority: Medium    GERD (gastroesophageal reflux disease) 11/19/2020     Priority: Medium    Major depressive disorder, recurrent episode, moderate ([place]) 06/16/2018    Priority: Medium    Normal coronary arteries 08/10/2017    Priority: Medium    Sleep apnea 08/10/2017    Priority: Medium    Hypertension, malignant 11/13/2014    Priority: Medium    Allergic rhinitis 11/19/2020    Priority: Low   Morbid obesity ([place]) 08/10/2017    Priority: Low   Anorectal abscess,left 07/27/2011    Priority: Low    Medications- reviewed and updated Current Outpatient Medications  Medication Sig Dispense Refill   acetaminophen (TYLENOL) 325 MG tablet Take 2 tablets (650 mg total) by mouth every 6 (six) hours as needed for mild pain (or Fever >/= 101).     albuterol (PROVENTIL HFA;VENTOLIN HFA) 108 (90 BASE) MCG/ACT inhaler Inhale 2 puffs into the lungs every 4 (four) hours as needed for wheezing or shortness of breath.     ALPRAZolam (XANAX) 0.5 MG tablet Take 1 tablet (0.5 mg total) by mouth 2 (two) times daily as needed for anxiety. 30 tablet 5   aspirin 81 MG chewable tablet Chew 1 tablet (81 mg total) by mouth daily.     Blood Glucose Monitoring Suppl (ONETOUCH VERIO FLEX SYSTEM) w/Device KIT Use to test blood sugars daily. Dx: E11.9 1 kit 3   carvedilol (COREG) 12.5 MG tablet Take 1 tablet (12.5 mg total) by mouth 2 (two) times daily. 180 tablet 3   Dulaglutide (TRULICITY) 1.5 YK/5.9DJ SOPN Inject 1.5 mg into the skin once a week. 5 mL 5   DULoxetine (  CYMBALTA) 30 MG capsule Take 1 capsule (30 mg total) by mouth 2 (two) times daily. 180 capsule 1   empagliflozin (JARDIANCE) 25 MG TABS tablet Take 1 tablet (25 mg total) by mouth daily before breakfast. 30 tablet 5   Fluticasone-Salmeterol (ADVAIR) 250-50 MCG/DOSE AEPB Inhale 1 puff into the lungs 2 (two) times daily.     furosemide (LASIX) 40 MG tablet Take 40 mg by mouth daily.     glucose blood test strip Use to test blood sugars daily. Dx: E11.9 100 each 12   HYDROcodone-acetaminophen (NORCO/VICODIN) 5-325 MG per  tablet Take 1 tablet by mouth every 6 (six) hours as needed for moderate pain.        30 tablet 0   Lancets (FREESTYLE) lancets Use to test blood sugar once daily. Dx: e11.9 100 each 12   molnupiravir EUA (LAGEVRIO) 200 mg CAPS capsule Take 4 capsules (800 mg total) by mouth 2 (two) times daily for 5 days. 40 capsule 0   Multiple Vitamin (MULTIVITAMIN) tablet Take 1 tablet by mouth daily.     NON FORMULARY 1 each by Other route See admin instructions. Use CPAP machine nightly.     omeprazole (PRILOSEC) 40 MG capsule Take 1 capsule (40 mg total) by mouth daily. 90 capsule 3   potassium chloride SA (KLOR-CON M20) 20 MEQ tablet TAKE 1 TABLET ONE TO TWO TIMES A DAY AS DIRECTED BY PHYSICIAN 30 tablet 0   rosuvastatin (CRESTOR) 20 MG tablet Take 1 tablet (20 mg total) by mouth daily. 90 tablet 3   spironolactone (ALDACTONE) 25 MG tablet Take 0.5 tablets (12.5 mg total) by mouth daily. 45 tablet 3   traZODone (DESYREL) 100 MG tablet TAKE 1-2 TABLETS (100-200 MG TOTAL) BY MOUTH AT BEDTIME AS NEEDED. 180 tablet 0   ENTRESTO 97-103 MG TAKE 1 TABLET BY MOUTH TWICE A DAY 60 tablet 11   No current facility-administered medications for this visit.     Objective:  BP 116/78    Pulse 97    Temp (!) 97.1 F (36.2 C)    Ht 5' 9.75" (1.772 m)    Wt (!) 301 lb (136.5 kg)    SpO2 95%    BMI 43.50 kg/m  self reported vitals Gen: NAD, resting comfortably Lungs: nonlabored, normal respiratory rate  Skin: appears dry, no obvious rash     Assessment and Plan   #Covid-19 S: Patient presents with   Covid Positive    Pt c/o chills, runny nose, body/muscle aches, cough and headache since last Friday. He tested positive Sunday morning, pt denies fever. Pt has no appetite, he has not eaten since Friday.   - feeling slightly better today  - no increase in wheezing despite baseline asthma A/P: Patient with testing confirming covid 19 with first day of covid 19 symptoms 09/12/21 Vaccination status: Unvaccinated from  COVID-19  Therefore: - recommended patient watch closely for shortness of breath or confusion or worsening symptoms and if those occur patient should contact us immediately or seek care in the emergency department -recommended patient consider purchasing pulse oximeter and if levels 94% or below persistently- seek care at the hospital - Patient needs to self isolate  for at least 5 days since first symptom AND at least 24 hours fever free without fever reducing medications AND have improvement in respiratory symptoms . After 5 days can end self isolation but still needs to wear mask for additional 5 days .  -Patient should inform close contacts about exposure (anyone  patient been around unmasked for more than 15 minutes)  -  we opted for molnupiravir    # Diabetes S: Medication:Trulicity and Jardiance Lab Results  Component Value Date   HGBA1C 6.6 (H) 04/18/2021   A/P: has been well controlled and no worsening with illness- continue current meds- would consider holding next dose of trulicity on this upcoming Friday   #Nonischemic cardiomyopathy #hypertension S: medication: Carvedilol 6.25 mg twice daily, Entresto 97-103 mg, spironolactone 12.5 mg, Lasix 40 mg as needed only BP Readings from Last 3 Encounters:  09/15/21 116/78  08/21/21 106/78  07/14/21 (!) 150/85  A/P: Blood pressure is well controlled-continue current medication.  No increased shortness of breath from baseline with no increased edema or weight gain-sounds euvolemic in regards to cardiomyopathy  # Asthma S: Maintenance Medication: Advair 250-50 mcg 1 puff twice daily As needed medication: Albuterol. Patient is using this mainly at night since getting covid A/P: overall stable other than increased ventolin mainly at night- feeling ok in daytime   Recommended follow up:  as needed if symptoms worsen or fail to improve Future Appointments  Date Time Provider [place]  09/22/2021 10:00 AM Shanon Ace, LCSW  [facility] None  09/29/2021  1:20 PM Marin Olp, MD [facility]  10/01/2021  3:30 PM Fredia Sorrow, RD [place] [facility]  10/23/2021  8:30 AM Irene Shipper, MD [facility]  07/14/2022  1:00 PM Ward Givens, NP [facility] None   Lab/Order associations:   ICD-10-CM   1. COVID-19  U07.1     2. Diabetes mellitus without complication ([place])  J88.4     3. Moderate persistent asthma without complication  Z66.06     4. Hypertension, essential  I10       Meds ordered this encounter  Medications   molnupiravir EUA (LAGEVRIO) 200 mg CAPS capsule    Sig: Take 4 capsules (800 mg total) by mouth 2 (two) times daily for 5 days.    Dispense:  40 capsule    Refill:  0    Return precautions advised.  Garret Reddish, MD

## 2021-09-22 ENCOUNTER — Encounter: Payer: Medicare Other | Admitting: Psychiatry

## 2021-09-22 DIAGNOSIS — F411 Generalized anxiety disorder: Secondary | ICD-10-CM

## 2021-09-22 NOTE — Progress Notes (Signed)
A user error has taken place: encounter opened in error, closed for administrative reasons.  Patient could not make appointment.
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
## 2021-09-29 ENCOUNTER — Ambulatory Visit (INDEPENDENT_AMBULATORY_CARE_PROVIDER_SITE_OTHER): Payer: Medicare Other | Admitting: Family Medicine

## 2021-09-29 ENCOUNTER — Other Ambulatory Visit: Payer: Self-pay

## 2021-09-29 ENCOUNTER — Encounter: Payer: Self-pay | Admitting: Family Medicine

## 2021-09-29 VITALS — BP 118/80 | HR 79 | Temp 97.8°F | Ht 69.0 in | Wt 287.0 lb

## 2021-09-29 DIAGNOSIS — J4540 Moderate persistent asthma, uncomplicated: Secondary | ICD-10-CM

## 2021-09-29 DIAGNOSIS — G4733 Obstructive sleep apnea (adult) (pediatric): Secondary | ICD-10-CM

## 2021-09-29 DIAGNOSIS — Z0000 Encounter for general adult medical examination without abnormal findings: Secondary | ICD-10-CM | POA: Diagnosis not present

## 2021-09-29 DIAGNOSIS — K219 Gastro-esophageal reflux disease without esophagitis: Secondary | ICD-10-CM

## 2021-09-29 DIAGNOSIS — E785 Hyperlipidemia, unspecified: Secondary | ICD-10-CM

## 2021-09-29 DIAGNOSIS — I428 Other cardiomyopathies: Secondary | ICD-10-CM

## 2021-09-29 DIAGNOSIS — R351 Nocturia: Secondary | ICD-10-CM | POA: Diagnosis not present

## 2021-09-29 DIAGNOSIS — E119 Type 2 diabetes mellitus without complications: Secondary | ICD-10-CM

## 2021-09-29 DIAGNOSIS — I10 Essential (primary) hypertension: Secondary | ICD-10-CM

## 2021-09-29 DIAGNOSIS — Z87891 Personal history of nicotine dependence: Secondary | ICD-10-CM | POA: Diagnosis not present

## 2021-09-29 LAB — CBC WITH DIFFERENTIAL/PLATELET
Basophils Absolute: 0 10*3/uL (ref 0.0–0.1)
Basophils Relative: 0.4 % (ref 0.0–3.0)
Eosinophils Absolute: 0.1 10*3/uL (ref 0.0–0.7)
Eosinophils Relative: 1 % (ref 0.0–5.0)
HCT: 44.1 % (ref 39.0–52.0)
Hemoglobin: 14.7 g/dL (ref 13.0–17.0)
Lymphocytes Relative: 10.6 % — ABNORMAL LOW (ref 12.0–46.0)
Lymphs Abs: 0.9 10*3/uL (ref 0.7–4.0)
MCHC: 33.3 g/dL (ref 30.0–36.0)
MCV: 88.5 fl (ref 78.0–100.0)
Monocytes Absolute: 0.9 10*3/uL (ref 0.1–1.0)
Monocytes Relative: 11.4 % (ref 3.0–12.0)
Neutro Abs: 6.3 10*3/uL (ref 1.4–7.7)
Neutrophils Relative %: 76.6 % (ref 43.0–77.0)
Platelets: 316 10*3/uL (ref 150.0–400.0)
RBC: 4.99 Mil/uL (ref 4.22–5.81)
RDW: 13.5 % (ref 11.5–15.5)
WBC: 8.2 10*3/uL (ref 4.0–10.5)

## 2021-09-29 LAB — POC URINALSYSI DIPSTICK (AUTOMATED)
Bilirubin, UA: NEGATIVE
Blood, UA: NEGATIVE
Glucose, UA: NEGATIVE
Ketones, UA: NEGATIVE
Leukocytes, UA: NEGATIVE
Nitrite, UA: NEGATIVE
Protein, UA: POSITIVE — AB
Spec Grav, UA: 1.015 (ref 1.010–1.025)
Urobilinogen, UA: 1 E.U./dL
pH, UA: 5.5 (ref 5.0–8.0)

## 2021-09-29 LAB — COMPREHENSIVE METABOLIC PANEL
ALT: 14 U/L (ref 0–53)
AST: 22 U/L (ref 0–37)
Albumin: 3.6 g/dL (ref 3.5–5.2)
Alkaline Phosphatase: 47 U/L (ref 39–117)
BUN: 17 mg/dL (ref 6–23)
CO2: 25 mEq/L (ref 19–32)
Calcium: 8.9 mg/dL (ref 8.4–10.5)
Chloride: 102 mEq/L (ref 96–112)
Creatinine, Ser: 1.63 mg/dL — ABNORMAL HIGH (ref 0.40–1.50)
GFR: 46.13 mL/min — ABNORMAL LOW (ref >60.00)
Glucose, Bld: 137 mg/dL — ABNORMAL HIGH (ref 70–99)
Potassium: 4.1 mEq/L (ref 3.5–5.1)
Sodium: 137 mEq/L (ref 135–145)
Total Bilirubin: 0.5 mg/dL (ref 0.2–1.2)
Total Protein: 7.1 g/dL (ref 6.0–8.3)

## 2021-09-29 LAB — HEMOGLOBIN A1C: Hgb A1c MFr Bld: 6.9 % — ABNORMAL HIGH (ref 4.6–6.5)

## 2021-09-29 LAB — PSA: PSA: 2.33 ng/mL (ref 0.10–4.00)

## 2021-09-29 MED ORDER — PREDNISONE 20 MG PO TABS: ORAL_TABLET | ORAL | 0 refills | Status: DC

## 2021-09-29 NOTE — Patient Instructions (Addendum)
Health Maintenance Due  Topic Date Due   FOOT EXAM - today Never done   Zoster Vaccines- Shingrix (1 of 2)- declines for now Never done   COLONOSCOPY- scheduled 12/28/2016   TETANUS/TDAP - recommend at pharmacy - cheaper there 10/17/2020   INFLUENZA VACCINE - declines fo rnow 04/07/2021   Covid seems to have stirred up asthma- lets try prednisone for 7 days. May aso help with congestion. If symptoms persist in sinuses after this please give me a call- id consider an antibiotic in that case   Please stop by lab before you go If you have mychart- we will send your results within 3 business days of [place] receiving them.  If you do not have mychart- we will call you about results within 5 business days of [place] receiving them.  *please also note that you will see labs on mychart as soon as they post. I will later go in and write notes on them- will say "notes from Dr. Yong Channel"   Recommended follow up: Return in about 6 months (around 03/29/2022) for follow up- or sooner if needed.

## 2021-09-30 ENCOUNTER — Other Ambulatory Visit: Payer: Self-pay | Admitting: Family Medicine

## 2021-09-30 MED ORDER — FLUTICASONE-SALMETEROL 250-50 MCG/ACT IN AEPB: 1.0000 | INHALATION_SPRAY | Freq: Two times a day (BID) | RESPIRATORY_TRACT | 3 refills | Status: DC

## 2021-10-01 ENCOUNTER — Ambulatory Visit: Payer: Medicare Other | Admitting: Dietician

## 2021-10-01 ENCOUNTER — Other Ambulatory Visit: Payer: Self-pay

## 2021-10-01 DIAGNOSIS — R7989 Other specified abnormal findings of blood chemistry: Secondary | ICD-10-CM

## 2021-10-15 ENCOUNTER — Other Ambulatory Visit (INDEPENDENT_AMBULATORY_CARE_PROVIDER_SITE_OTHER): Payer: Medicare Other

## 2021-10-15 ENCOUNTER — Other Ambulatory Visit: Payer: Self-pay

## 2021-10-15 DIAGNOSIS — R7989 Other specified abnormal findings of blood chemistry: Secondary | ICD-10-CM

## 2021-10-16 LAB — BASIC METABOLIC PANEL
BUN: 12 mg/dL (ref 6–23)
CO2: 26 mEq/L (ref 19–32)
Calcium: 8.8 mg/dL (ref 8.4–10.5)
Chloride: 106 mEq/L (ref 96–112)
Creatinine, Ser: 1.28 mg/dL (ref 0.40–1.50)
GFR: 61.64 mL/min (ref >60.00)
Glucose, Bld: 124 mg/dL — ABNORMAL HIGH (ref 70–99)
Potassium: 4.4 mEq/L (ref 3.5–5.1)
Sodium: 140 mEq/L (ref 135–145)

## 2021-10-23 ENCOUNTER — Ambulatory Visit (AMBULATORY_SURGERY_CENTER): Payer: Medicare Other | Admitting: Internal Medicine

## 2021-10-23 ENCOUNTER — Encounter: Payer: Self-pay | Admitting: Internal Medicine

## 2021-10-23 ENCOUNTER — Other Ambulatory Visit: Payer: Self-pay

## 2021-10-23 VITALS — BP 138/82 | HR 79 | Temp 97.7°F | Resp 12 | Ht 69.0 in | Wt 301.0 lb

## 2021-10-23 DIAGNOSIS — Z86010 Personal history of colonic polyps: Secondary | ICD-10-CM

## 2021-10-23 MED ORDER — SODIUM CHLORIDE 0.9 % IV SOLN: 500.0000 mL | Freq: Once | INTRAVENOUS | Status: DC

## 2021-10-23 NOTE — Op Note (Signed)
[place] Patient Name: [name] Procedure Date: 10/23/2021 8:48 AM MRN: [id number] Endoscopist: Docia Chuck. [name] , MD Age: 61 Referring MD:  Date of Birth: [birth year] Gender: Male Account #: [id number] Procedure:                Colonoscopy Indications:              High risk colon cancer surveillance: Personal                            history of non-advanced adenoma. Previous                            examinations 2004 ([place]), 2007, 2013 Medicines:                Monitored Anesthesia Care Procedure:                Pre-Anesthesia Assessment:                           - Prior to the procedure, a History and Physical                            was performed, and patient medications and                            allergies were reviewed. The patient's tolerance of                            previous anesthesia was also reviewed. The risks                            and benefits of the procedure and the sedation                            options and risks were discussed with the patient.                            All questions were answered, and informed consent                            was obtained. Prior Anticoagulants: The patient has                            taken no previous anticoagulant or antiplatelet                            agents. ASA Grade Assessment: II - A patient with                            mild systemic disease. After reviewing the risks                            and benefits, the patient was deemed in  satisfactory condition to undergo the procedure.                           After obtaining informed consent, the colonoscope                            was passed under direct vision. Throughout the                            procedure, the patient's blood pressure, pulse, and                            oxygen saturations were monitored continuously. The                            Olympus CF-HQ190L [id number])  Colonoscope was                            introduced through the anus and advanced to the the                            cecum, identified by appendiceal orifice and                            ileocecal valve. The ileocecal valve, appendiceal                            orifice, and rectum were photographed. The quality                            of the bowel preparation was excellent. The                            colonoscopy was performed without difficulty. The                            patient tolerated the procedure well. The bowel                            preparation used was SUPREP via split dose                            instruction. Scope In: 8:57:11 AM Scope Out: 9:08:04 AM Scope Withdrawal Time: 0 hours 7 minutes 29 seconds  Total Procedure Duration: 0 hours 10 minutes 53 seconds  Findings:                 Multiple diverticula were found in the sigmoid                            colon.                           Internal hemorrhoids were found during  retroflexion. The hemorrhoids were moderate with                            minor bleeding.                           The exam was otherwise without abnormality on                            direct and retroflexion views. Complications:            No immediate complications. Estimated blood loss:                            None. Estimated Blood Loss:     Estimated blood loss: none. Impression:               - Diverticulosis in the sigmoid colon.                           - Internal hemorrhoids, with bleeding.                           - The examination was otherwise normal on direct                            and retroflexion views.                           - No specimens collected. Recommendation:           - Repeat colonoscopy in 10 years for surveillance.                           - Patient has a contact number available for                            emergencies. The signs and symptoms of  potential                            delayed complications were discussed with the                            patient. Return to normal activities tomorrow.                            Written discharge instructions were provided to the                            patient.                           - Resume previous diet.                           - Continue present medications.                           -  Metamucil 2 tablespoons daily Tyden Kann N. [name], MD 10/23/2021 9:13:40 AM This report has been signed electronically.

## 2021-10-23 NOTE — Progress Notes (Signed)
Pt's states no medical or surgical changes since previsit or office visit.  VS CW  
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
## 2021-10-23 NOTE — Patient Instructions (Signed)

## 2021-10-23 NOTE — Progress Notes (Signed)
PT taken to PACU. Monitors in place. VSS. Report given to RN. 

## 2021-10-27 ENCOUNTER — Telehealth: Payer: Self-pay | Admitting: Cardiology

## 2021-10-27 NOTE — Telephone Encounter (Signed)
Patient calling the office for samples of medication:   1.  What medication and dosage are you requesting samples for?  ENTRESTO 97-103 MG   2.  Are you currently out of this medication? Yes   

## 2021-10-27 NOTE — Telephone Encounter (Signed)
Pt's wife returned call. Transferred to RN.

## 2021-10-27 NOTE — Telephone Encounter (Signed)
Left message for pt wife to call  

## 2021-10-27 NOTE — Telephone Encounter (Signed)
Spoke with pt wife, aware there are no samples available. She is waiting to hear from patient assistance.

## 2021-10-28 ENCOUNTER — Telehealth: Payer: Self-pay

## 2021-10-28 ENCOUNTER — Telehealth: Payer: Self-pay | Admitting: *Deleted

## 2021-10-28 NOTE — Telephone Encounter (Signed)
Second post procedure follow up call, no answer 

## 2021-10-28 NOTE — Telephone Encounter (Signed)
Left message on f/u call 

## 2021-11-10 ENCOUNTER — Other Ambulatory Visit: Payer: Self-pay

## 2021-11-10 ENCOUNTER — Ambulatory Visit (INDEPENDENT_AMBULATORY_CARE_PROVIDER_SITE_OTHER): Payer: Medicare Other

## 2021-11-10 DIAGNOSIS — Z0000 Encounter for general adult medical examination without abnormal findings: Secondary | ICD-10-CM | POA: Diagnosis not present

## 2021-11-10 NOTE — Patient Instructions (Addendum)
[name] , Thank you for taking time to come for your Medicare Wellness Visit. I appreciate your ongoing commitment to your health goals. Please review the following plan we discussed and let me know if I can assist you in the future.   Screening recommendations/referrals: Colonoscopy: Done 10/23/21 repeat every 10 years  Recommended yearly ophthalmology/optometry visit for glaucoma screening and checkup Recommended yearly dental visit for hygiene and checkup  Vaccinations: Influenza vaccine: Due and discussed  Pneumococcal vaccine: Up to date Tdap vaccine: Done 07/19/17 repeat every 10 years  Shingles vaccine: Shingrix discussed. Please contact your pharmacy for coverage information.    Covid-19: Declined and discussed   Advanced directives: Advance directive discussed with you today. Even though you declined this today please call our office should you change your mind and we can give you the proper paperwork for you to fill out.  Conditions/risks identified: Lose weight   Next appointment: Follow up in one year for your annual wellness visit   Preventive Care 40-64 Years, Male Preventive care refers to lifestyle choices and visits with your health care provider that can promote health and wellness. What does preventive care include? A yearly physical exam. This is also called an annual well check. Dental exams once or twice a year. Routine eye exams. Ask your health care provider how often you should have your eyes checked. Personal lifestyle choices, including: Daily care of your teeth and gums. Regular physical activity. Eating a healthy diet. Avoiding tobacco and drug use. Limiting alcohol use. Practicing safe sex. Taking low-dose aspirin every day starting at age 21. What happens during an annual well check? The services and screenings done by your health care provider during your annual well check will depend on your age, overall health, lifestyle risk factors, and  family history of disease. Counseling  Your health care provider may ask you questions about your: Alcohol use. Tobacco use. Drug use. Emotional well-being. Home and relationship well-being. Sexual activity. Eating habits. Work and work Statistician. Screening  You may have the following tests or measurements: Height, weight, and BMI. Blood pressure. Lipid and cholesterol levels. These may be checked every 5 years, or more frequently if you are over 68 years old. Skin check. Lung cancer screening. You may have this screening every year starting at age 93 if you have a 30-pack-year history of smoking and currently smoke or have quit within the past 15 years. Fecal occult blood test (FOBT) of the stool. You may have this test every year starting at age 47. Flexible sigmoidoscopy or colonoscopy. You may have a sigmoidoscopy every 5 years or a colonoscopy every 10 years starting at age 36. Prostate cancer screening. Recommendations will vary depending on your family history and other risks. Hepatitis C blood test. Hepatitis B blood test. Sexually transmitted disease (STD) testing. Diabetes screening. This is done by checking your blood sugar (glucose) after you have not eaten for a while (fasting). You may have this done every 1-3 years. Discuss your test results, treatment options, and if necessary, the need for more tests with your health care provider. Vaccines  Your health care provider may recommend certain vaccines, such as: Influenza vaccine. This is recommended every year. Tetanus, diphtheria, and acellular pertussis (Tdap, Td) vaccine. You may need a Td booster every 10 years. Zoster vaccine. You may need this after age 26. Pneumococcal 13-valent conjugate (PCV13) vaccine. You may need this if you have certain conditions and have not been vaccinated. Pneumococcal polysaccharide (PPSV23) vaccine. You may  need one or two doses if you smoke cigarettes or if you have certain  conditions. Talk to your health care provider about which screenings and vaccines you need and how often you need them. This information is not intended to replace advice given to you by your health care provider. Make sure you discuss any questions you have with your health care provider. Document Released: 09/20/2015 Document Revised: 05/13/2016 Document Reviewed: 06/25/2015 Elsevier Interactive Patient Education  2017 [place] Prevention in the Home Falls can cause injuries. They can happen to people of all ages. There are many things you can do to make your home safe and to help prevent falls. What can I do on the outside of my home? Regularly fix the edges of walkways and driveways and fix any cracks. Remove anything that might make you trip as you walk through a door, such as a raised step or threshold. Trim any bushes or trees on the path to your home. Use bright outdoor lighting. Clear any walking paths of anything that might make someone trip, such as rocks or tools. Regularly check to see if handrails are loose or broken. Make sure that both sides of any steps have handrails. Any raised decks and porches should have guardrails on the edges. Have any leaves, snow, or ice cleared regularly. Use sand or salt on walking paths during winter. Clean up any spills in your garage right away. This includes oil or grease spills. What can I do in the bathroom? Use night lights. Install grab bars by the toilet and in the tub and shower. Do not use towel bars as grab bars. Use non-skid mats or decals in the tub or shower. If you need to sit down in the shower, use a plastic, non-slip stool. Keep the floor dry. Clean up any water that spills on the floor as soon as it happens. Remove soap buildup in the tub or shower regularly. Attach bath mats securely with double-sided non-slip rug tape. Do not have throw rugs and other things on the floor that can make you trip. What can I do in  the bedroom? Use night lights. Make sure that you have a light by your bed that is easy to reach. Do not use any sheets or blankets that are too big for your bed. They should not hang down onto the floor. Have a firm chair that has side arms. You can use this for support while you get dressed. Do not have throw rugs and other things on the floor that can make you trip. What can I do in the kitchen? Clean up any spills right away. Avoid walking on wet floors. Keep items that you use a lot in easy-to-reach places. If you need to reach something above you, use a strong step stool that has a grab bar. Keep electrical cords out of the way. Do not use floor polish or wax that makes floors slippery. If you must use wax, use non-skid floor wax. Do not have throw rugs and other things on the floor that can make you trip. What can I do with my stairs? Do not leave any items on the stairs. Make sure that there are handrails on both sides of the stairs and use them. Fix handrails that are broken or loose. Make sure that handrails are as long as the stairways. Check any carpeting to make sure that it is firmly attached to the stairs. Fix any carpet that is loose or worn. Avoid having  throw rugs at the top or bottom of the stairs. If you do have throw rugs, attach them to the floor with carpet tape. Make sure that you have a light switch at the top of the stairs and the bottom of the stairs. If you do not have them, ask someone to add them for you. What else can I do to help prevent falls? Wear shoes that: Do not have high heels. Have rubber bottoms. Are comfortable and fit you well. Are closed at the toe. Do not wear sandals. If you use a stepladder: Make sure that it is fully opened. Do not climb a closed stepladder. Make sure that both sides of the stepladder are locked into place. Ask someone to hold it for you, if possible. Clearly mark and make sure that you can see: Any grab bars or  handrails. First and last steps. Where the edge of each step is. Use tools that help you move around (mobility aids) if they are needed. These include: Canes. Walkers. Scooters. Crutches. Turn on the lights when you go into a dark area. Replace any light bulbs as soon as they burn out. Set up your furniture so you have a clear path. Avoid moving your furniture around. If any of your floors are uneven, fix them. If there are any pets around you, be aware of where they are. Review your medicines with your doctor. Some medicines can make you feel dizzy. This can increase your chance of falling. Ask your doctor what other things that you can do to help prevent falls. This information is not intended to replace advice given to you by your health care provider. Make sure you discuss any questions you have with your health care provider. Document Released: 06/20/2009 Document Revised: 01/30/2016 Document Reviewed: 09/28/2014 Elsevier Interactive Patient Education  2017 [employer].
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
## 2021-11-18 ENCOUNTER — Ambulatory Visit: Payer: Medicare Other | Admitting: Dietician

## 2021-11-25 ENCOUNTER — Telehealth: Payer: Self-pay | Admitting: Family Medicine

## 2021-11-25 MED ORDER — ALBUTEROL SULFATE HFA 108 (90 BASE) MCG/ACT IN AERS: 2.0000 | INHALATION_SPRAY | RESPIRATORY_TRACT | 3 refills | Status: DC | PRN

## 2021-11-25 NOTE — Telephone Encounter (Signed)
.. ?  Encourage patient to contact the pharmacy for refills or they can request refills through [facility] ? ?LAST APPOINTMENT DATE:  1/23 ? ?NEXT APPOINTMENT DATE:7/25 ? ?MEDICATION:albuterol (PROVENTIL HFA;VENTOLIN HFA) 108 (90 BASE) MCG/ACT inhaler ? ?Is the patient out of medication? yes ? ?[facility]  ? ?Let patient know to contact pharmacy at the end of the day to make sure medication is ready. ? ?Please notify patient to allow 48-72 hours to process  ?

## 2021-11-25 NOTE — Telephone Encounter (Signed)
Rx refilled.

## 2021-12-01 ENCOUNTER — Other Ambulatory Visit: Payer: Self-pay | Admitting: Family Medicine

## 2022-01-02 ENCOUNTER — Other Ambulatory Visit: Payer: Self-pay

## 2022-01-02 DIAGNOSIS — E119 Type 2 diabetes mellitus without complications: Secondary | ICD-10-CM

## 2022-01-05 ENCOUNTER — Encounter: Payer: Medicare Other | Attending: Family Medicine | Admitting: Dietician

## 2022-01-27 ENCOUNTER — Ambulatory Visit (INDEPENDENT_AMBULATORY_CARE_PROVIDER_SITE_OTHER): Payer: Medicare Other | Admitting: Psychiatry

## 2022-01-27 DIAGNOSIS — F411 Generalized anxiety disorder: Secondary | ICD-10-CM | POA: Diagnosis not present

## 2022-01-27 NOTE — Progress Notes (Signed)
[facility] Counselor/Therapist Progress Note  Patient ID: [name], MRN: [id number],    Date: 01/27/2022  Time Spent: 48 minutes   Virtual Visit via Telehealth Note: Telephone  Only Session; Pt. Unable to do Video session due to phyical issues Connected with patient by a telemedicine/telehealth application, with their informed consent, and verified patient privacy and that I am speaking with the correct person using two identifiers. I discussed the limitations, risks, security and privacy concerns of performing psychotherapy and the availability of in person appointments. I also discussed with the patient that there may be a patient responsible charge related to this service. The patient expressed understanding and agreed to proceed. I discussed the treatment planning with the patient. The patient was provided an opportunity to ask questions and all were answered. The patient agreed with the plan and demonstrated an understanding of the instructions. The patient was advised to call  our office if  symptoms worsen or feel they are in a crisis state and need immediate contact.   Therapist Location: office Patient Location: home   Treatment Type: Individual Therapy  Reported Symptoms: anxiety, depression, grief over loss of another family member   anxiety (which he reports as strongest symptom) and depression re: family and health issues, Reports having new PCP since "about Nov 21, 2020, Dr. Garret Reddish with [facility]"   Mental Status Exam:  Appearance:   N/a  telehealth      Behavior:  Appropriate, Sharing, and Motivated   Motor:  N/a  telehealth  Speech/Language:   Clear and Coherent  Affect:  N/a  telehealth  Mood:  anxious  Thought process:  goal directed  Thought content:    WNL  Sensory/Perceptual disturbances:    WNL  Orientation:  oriented to person, place, time/date, situation, day of week, month of year, year, and stated date of Jan 27, 2022  Attention:  Good  Concentration:  Good  Memory:  [place] of knowledge:   Good  Insight:    Good  Judgment:   Good  Impulse Control:  Good   Risk Assessment: Danger to Self:  No Self-injurious Behavior: No Danger to Others: No Duty to Warn:no Physical Aggression / Violence:No  Access to Firearms a concern: No  Gang Involvement:No   Subjective: Patient today reporting more physical issues including some breathing issues, and had Covid in 11/22/2022. Changed primary care physician to Dr. Garret Reddish at [facility]. Sister in law died in 2022/11/22. Patient processing concerns about his ongoing health issues and his wife is having some physical concerns and to have bone marrow test soon. Conflictual situations with one of his daughters continues to be a stressor and concern for patient. Realizes his need to continue setting boundaries as discussed some in earlier sessions and much more in today's session.  Wife remains very supportive as do a lot of his friends and extended family.  Is very appreciative of session today and being able to vent a lot of his concerns especially regarding his and his wife's health and also some difficult family situations.  Trying to set appropriate limits and boundaries within family and beyond as needed.  Also feels that as he talks things out in session, he is better able later to let go and reduce his stress some.  Interventions: Solution-Oriented/Positive Psychology and Insight-Oriented  Treatment Goals: Goals may remain on the tx plan as patient works on strategies to achieve  goals.  Progress or lack of progress will be documented each session in "Progress" section on Plan. Long term goal: Develop the ability to recognize, accept, and cope with feelings of depression/anxiety. Short term goal: Identify and replace depressive/anxious thinking that leads to depressive/anxious feelings and actions. Strategy: Educate patient  about cognitive restructuring, including self-monitoring of automatic thoughts, and working to interrupt his depressive/anxious thought patterns and replace with cognitive patterns that do not support depression or anxiety.  Diagnosis:   ICD-10-CM   1. Generalized anxiety disorder  F41.1      Plan: Patient today showing good motivation and participation in session as he worked more on some personal and family related issues as well as grief issues related to another family loss recently.  Does well in expressing a lot of his concerns and ways that are helpful to him both in processing all that he is feeling but also in being able to let go after venting rather than getting stuck in cycles of worry.  Spoke about how his faith means a lot to him as well and is helpful in coping with personal and family stressors.  Not as depressed.  More anxiety especially about physical issues for him and wife but felt better after talking about them in more detail in today's session.  Continues to have to maintain very clear boundaries with certain family members. Encouraged patient in his practice of more positive behaviors as discussed in sessions including: Intentionally looking for more positives each day, following his medical doctor's advice on his multiple medical issues and taking all of his medicines as prescribed, staying in the present rather than the future or the past, focus on what he can control versus cannot, consistent positive self talk, remaining in contact with people who are supportive of him, letting his faith be a resource for his emotional health as well as spiritual, and realize the strength he shows working with goal directed behaviors to move forward in a direction that supports his improved emotional health and overall outlook.  Goal review and progress/challenges noted with patient.  Next appointment within approximately 4 to 6 weeks.  This record has been created using [employer].   Chart creation errors have been sought, but may not always have been located and corrected.  Such creation errors do not reflect on the standard of medical care provided.   Shanon Ace, LCSW

## 2022-03-18 ENCOUNTER — Ambulatory Visit (INDEPENDENT_AMBULATORY_CARE_PROVIDER_SITE_OTHER): Payer: Medicare Other | Admitting: Psychiatry

## 2022-03-18 DIAGNOSIS — F411 Generalized anxiety disorder: Secondary | ICD-10-CM

## 2022-03-18 NOTE — Progress Notes (Signed)
[facility] Counselor/Therapist Progress Note  Patient ID: [name], MRN: [id number],    Date: 03/18/2022  Time Spent: 50 minutes   Virtual Visit via Telehealth Note: Telephone on the session as Pt. unable to use Video due to physical issues. Connected with patient by a telemedicine/telehealth application, with their informed consent, and verified patient privacy and that I am speaking with the correct person using two identifiers. I discussed the limitations, risks, security and privacy concerns of performing psychotherapy and the availability of in person appointments. I also discussed with the patient that there may be a patient responsible charge related to this service. The patient expressed understanding and agreed to proceed. I discussed the treatment planning with the patient. The patient was provided an opportunity to ask questions and all were answered. The patient agreed with the plan and demonstrated an understanding of the instructions. The patient was advised to call  our office if  symptoms worsen or feel they are in a crisis state and need immediate contact.   Therapist Location: office Patient Location: home   Treatment Type: Individual Therapy  Reported Symptoms: anxiety, stressed especially with adult daughter some issues disruptive to family especially patient and wife, frustration, depression  Mental Status Exam:  Appearance:   N/a   telehealth      Behavior:  Appropriate, Sharing, and Motivated  Motor:  Difficulty moving about due to health issues  Speech/Language:   Clear and Coherent  Affect:  N/a  telehealth  Mood:  anxious, depressed, and irritable  Thought process:  goal directed  Thought content:    Overthinking with family situation especially  Sensory/Perceptual disturbances:    WNL  Orientation:  oriented to person, place, time/date, situation, day of week, month of year, year, and stated date of March 18, 2022  Attention:  Good   Concentration:  Good  Memory:  WNL  Fund of knowledge:   Good  Insight:    Good  Judgment:   Good  Impulse Control:  Good   Risk Assessment: Danger to Self:  No Self-injurious Behavior: No Danger to Others: No Duty to Warn:no Physical Aggression / Violence:No  Access to Firearms a concern: No  Gang Involvement:No   Subjective:  Patient today reporting anxiety, depression, frustration, irritability, sadness over family dynamics.  Needed session today to address the symptoms and the situations involved.Has lost over 58lbs since Feb. 2023 and states he lost his smell and taste since having Covid in Feb 2023 and "now the taste is back but smell is not." States that he has doctor's appt whithin next couple weeks and plans to talk with Dr about his weight loss and make sure it is ok and safe. States he is feeling better with the weight loss and not feeling "so heavy." Processed a lot of family issues going on that are very hurtful and patient able to see how he is limited in what he can change, but can continue to set healthier limits with some family members and not be pulled into situations where he is feeling hurt or uncomfortable. 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  Interventions: Cognitive Behavioral Therapy  Treatment Goals: Goals may remain on the tx plan as patient works on strategies to achieve goals.  Progress or lack of progress will be documented each  session in "Progress" section on Plan. Long term goal: Develop the ability to recognize, accept, and cope with feelings of depression/anxiety. Short term goal: Identify and replace depressive/anxious thinking that leads to depressive/anxious feelings and actions. Strategy: Educate patient about cognitive restructuring, including self-monitoring of automatic thoughts, and  working to interrupt his depressive/anxious thought patterns and replace with cognitive patterns that do not support depression or anxiety.   Diagnosis:   ICD-10-CM   1. Generalized anxiety disorder  F41.1      Plan:   Patient today entering session with good motivation and participated well.  Focused on his anxiety, depression, family issues, sadness, irritability, and frustration.  Was very open in session talking about his symptoms and the most significant aggravating factor which is family issues.  Easy for him to get manipulated by family members and he is trying to have healthier boundaries including being able to "leave or walk away" rather than being pulled into situations that he does not want to be a part of.  Regrets how it impacts his and his wife's relationship with grandchildren but adds that he does maintain some contact and makes the best of what ever time and opportunities he has.  Stated how "it so helpful to be able to talk at length today and express my feelings and emotions and feel understood "and also some affirmation that "I am handling things as well as possible" and so much of it is out of his control.  Also shares how his faith is important to him, especially in his coping with things he cannot control. Encouraged patient in his practice of more positive behaviors including: Following his medical doctor's advice on his multiple medical issues and taking his medications as prescribed, intentionally looking for more positives each day, staying in the present rather than the future or the past, focus on what he can control versus cannot, positive self talk, remaining in contact with people who are supportive of him, letting his faith be a resource for his emotional health as well as spiritual, and recognize the strength he shows working with goal directed behaviors to move in a direction that supports his improved emotional health.  Goal review and progress/challenges noted with  patient.  Next appointment within approximately 4 to 5 weeks.  This record has been created using [employer].  Chart creation errors have been sought, but may not always have been located and corrected.  Such creation errors do not reflect on the standard of medical care provided.   Shanon Ace, LCSW

## 2022-03-19 ENCOUNTER — Other Ambulatory Visit: Payer: Self-pay | Admitting: Family Medicine

## 2022-03-31 ENCOUNTER — Ambulatory Visit: Payer: Medicare Other | Admitting: Family Medicine

## 2022-04-09 ENCOUNTER — Ambulatory Visit: Payer: Medicare Other | Admitting: Family Medicine

## 2022-04-12 ENCOUNTER — Other Ambulatory Visit: Payer: Self-pay | Admitting: Family Medicine

## 2022-04-27 ENCOUNTER — Other Ambulatory Visit: Payer: Self-pay | Admitting: Family Medicine

## 2022-04-27 ENCOUNTER — Encounter: Payer: Self-pay | Admitting: Physician Assistant

## 2022-04-27 ENCOUNTER — Telehealth (INDEPENDENT_AMBULATORY_CARE_PROVIDER_SITE_OTHER): Payer: Medicare Other | Admitting: Physician Assistant

## 2022-04-27 DIAGNOSIS — G4733 Obstructive sleep apnea (adult) (pediatric): Secondary | ICD-10-CM | POA: Diagnosis not present

## 2022-04-27 DIAGNOSIS — F411 Generalized anxiety disorder: Secondary | ICD-10-CM

## 2022-04-27 DIAGNOSIS — F3341 Major depressive disorder, recurrent, in partial remission: Secondary | ICD-10-CM | POA: Diagnosis not present

## 2022-04-27 DIAGNOSIS — G4700 Insomnia, unspecified: Secondary | ICD-10-CM

## 2022-04-27 MED ORDER — TRAZODONE HCL 100 MG PO TABS: 100.0000 mg | ORAL_TABLET | Freq: Every evening | ORAL | 1 refills | Status: DC | PRN

## 2022-04-27 MED ORDER — FREESTYLE LITE TEST VI STRP: ORAL_STRIP | 0 refills | Status: DC

## 2022-04-27 MED ORDER — BUSPIRONE HCL 15 MG PO TABS: ORAL_TABLET | ORAL | 1 refills | Status: DC

## 2022-04-27 MED ORDER — FREESTYLE LANCETS MISC: 0 refills | Status: DC

## 2022-04-27 MED ORDER — ALPRAZOLAM 0.5 MG PO TABS: 0.5000 mg | ORAL_TABLET | Freq: Two times a day (BID) | ORAL | 5 refills | Status: DC | PRN

## 2022-04-27 MED ORDER — DULOXETINE HCL 30 MG PO CPEP
30.0000 mg | ORAL_CAPSULE | Freq: Every day | ORAL | 1 refills | Status: DC
Start: 2022-04-27 — End: 2022-11-13

## 2022-04-27 NOTE — Progress Notes (Signed)
[facility] Med Check  Patient ID: [name],  MRN: [id number]  PCP: Marin Olp, MD  Date of Evaluation: 04/27/2022 Time spent:30 minutes  Chief Complaint:  Chief Complaint   Anxiety; Depression; Insomnia; Follow-up    Virtual Visit via Telehealth  I connected with patient by a video enabled telemedicine application with their informed consent, and verified patient privacy and that I am speaking with the correct person using two identifiers.  I am private, in my office and the patient is at home.  I discussed the limitations, risks, security and privacy concerns of performing an evaluation and management service by  video and the availability of in person appointments. I also discussed with the patient that there may be a patient responsible charge related to this service. The patient expressed understanding and agreed to proceed.   I discussed the assessment and treatment plan with the patient. The patient was provided an opportunity to ask questions and all were answered. The patient agreed with the plan and demonstrated an understanding of the instructions.   The patient was advised to call back or seek an in-person evaluation if the symptoms worsen or if the condition fails to improve as anticipated.  I provided 30  minutes of non-face-to-face time during this encounter.  HISTORY/CURRENT STATUS: HPI lost to follow-up for almost 2 years.  [name] has been under a lot of stress.  He continues to grieve the loss of his brother, especially this time of year, his brother's birthday is in September.  His daughter still gives him a lot of reason to be stressed.  States it is a daily problem.  He feels anxious every day but has panic attacks a few times a month.  When they happen, he will get short of breath, feels jittery and his palms will sweat.  He has shortness of breath.  Lasts a few minutes.  Xanax does help.  His wife helps him calm down.  He is depressed, but  feels that it is situational for the most part.  He does not enjoy much of anything, but partly that is due to his physical health and chronic problems.  Energy and motivation are fair to good depending on the day.  ADLs and personal hygiene are normal.  He has lost quite a bit of weight since being on Trulicity.  Appetite has been decreased since then.  Has trouble sleeping, trazodone is still effective.  Does use his CPAP faithfully.  Unable to work for physical health reasons.  No suicidal or homicidal thoughts.  Patient denies increased energy with decreased need for sleep, increased talkativeness, racing thoughts, impulsivity or risky behaviors, increased spending, increased libido, grandiosity, increased irritability or anger, paranoia, and no hallucinations.  Denies dizziness, syncope, seizures, numbness, tingling, tremor, tics, unsteady gait, slurred speech, confusion. Denies muscle or joint pain, stiffness, or dystonia.  Individual Medical History/ Review of Systems: Changes? :No   Past medications for mental health diagnoses include: Cymbalta, Trazodone, Xanax  Allergies: Shellfish allergy  Current Medications:  Current Outpatient Medications:    acetaminophen (TYLENOL) 325 MG tablet, Take 2 tablets (650 mg total) by mouth every 6 (six) hours as needed for mild pain (or Fever >/= 101)., Disp: , Rfl:    albuterol (VENTOLIN HFA) 108 (90 Base) MCG/ACT inhaler, INHALE 2 PUFFS INTO THE LUNGS EVERY 4 HOURS AS NEEDED FOR WHEEZING OR SHORTNESS OF BREATH., Disp: 18 each, Rfl: 3   aspirin 81 MG chewable tablet, Chew 1 tablet (81 mg total) by  mouth daily., Disp: , Rfl:    Blood Glucose Monitoring Suppl (Ivy) w/Device KIT, Use to test blood sugars daily. Dx: E11.9, Disp: 1 kit, Rfl: 3   busPIRone (BUSPAR) 15 MG tablet, 1/3 tablet twice daily for 1 week, then increase to 2/3 tablet twice daily for 1 week, then increase to 1 tablet twice daily for anxiety., Disp: 60 tablet, Rfl:  1   carvedilol (COREG) 12.5 MG tablet, Take 1 tablet (12.5 mg total) by mouth 2 (two) times daily., Disp: 180 tablet, Rfl: 3   Dulaglutide (TRULICITY) 1.5 OQ/9.4TM SOPN, Inject 1.5 mg into the skin once a week., Disp: 5 mL, Rfl: 5   empagliflozin (JARDIANCE) 25 MG TABS tablet, Take 1 tablet (25 mg total) by mouth daily before breakfast., Disp: 30 tablet, Rfl: 5   ENTRESTO 97-103 MG, TAKE 1 TABLET BY MOUTH TWICE A DAY, Disp: 60 tablet, Rfl: 11   fluticasone-salmeterol (ADVAIR DISKUS) 250-50 MCG/ACT AEPB, Inhale 1 puff into the lungs in the morning and at bedtime., Disp: 3 each, Rfl: 3   furosemide (LASIX) 40 MG tablet, Take 40 mg by mouth daily., Disp: , Rfl:    glucose blood (FREESTYLE LITE) test strip, USE 1 STRIP TO CHECK GLUCOSE ONCE DAILY, Disp: 100 each, Rfl: 0   Lancets (FREESTYLE) lancets, USE 1  TO CHECK GLUCOSE ONCE DAILY, Disp: 100 each, Rfl: 0   Multiple Vitamin (MULTIVITAMIN) tablet, Take 1 tablet by mouth daily., Disp: , Rfl:    NON FORMULARY, 1 each by Other route See admin instructions. Use CPAP machine nightly., Disp: , Rfl:    omeprazole (PRILOSEC) 40 MG capsule, Take 1 capsule (40 mg total) by mouth daily., Disp: 90 capsule, Rfl: 3   rosuvastatin (CRESTOR) 20 MG tablet, TAKE 1 TABLET BY MOUTH EVERY DAY, Disp: 90 tablet, Rfl: 3   ALPRAZolam (XANAX) 0.5 MG tablet, Take 1 tablet (0.5 mg total) by mouth 2 (two) times daily as needed for anxiety., Disp: 30 tablet, Rfl: 5   DULoxetine (CYMBALTA) 30 MG capsule, Take 1 capsule (30 mg total) by mouth daily., Disp: 90 capsule, Rfl: 1   spironolactone (ALDACTONE) 25 MG tablet, Take 0.5 tablets (12.5 mg total) by mouth daily., Disp: 45 tablet, Rfl: 3   traZODone (DESYREL) 100 MG tablet, Take 1-2 tablets (100-200 mg total) by mouth at bedtime as needed., Disp: 180 tablet, Rfl: 1 Medication Side Effects: none  Family Medical/ Social History: Changes? No  MENTAL HEALTH EXAM:  There were no vitals taken for this visit.There is no height or  weight on file to calculate BMI.  General Appearance: Casual, Well Groomed, and Obese  Eye Contact:  Good  Speech:  Clear and Coherent and Normal Rate  Volume:  Normal  Mood:  Euthymic  Affect:  Congruent  Thought Process:  Goal Directed and Descriptions of Associations: Circumstantial  Orientation:  Full (Time, Place, and Person)  Thought Content: Logical   Suicidal Thoughts:  No  Homicidal Thoughts:  No  Memory:  WNL  Judgement:  Good  Insight:  Good  Psychomotor Activity:  Normal  Concentration:  Concentration: Good  Recall:  Good  Fund of Knowledge: Good  Language: Good  Assets:  Desire for Improvement Financial Resources/Insurance Housing Transportation  ADL's:  Intact  Cognition: WNL  Prognosis:  Good    DIAGNOSES:    ICD-10-CM   1. Generalized anxiety disorder  F41.1     2. 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 Insomnia, unspecified type  G47.00     4. Obstructive sleep apnea  G47.33       Receiving Psychotherapy: Yes    with Rinaldo Cloud, LCSW   RECOMMENDATIONS:  PDMP reviewed. No results available.  I provided 30 minutes of non-face-to-face time during this encounter, including time spent before and after the visit in records review, medical decision making, counseling pertinent to today's visit, and charting.   We discussed his symptoms.  The anxiety is worse than the depression right now, options for treatment would be to change Cymbalta to an SSRI that would help both depression and anxiety.  We have discussed that in the past and he does not want to take anything that may cause erectile dysfunction so I do not recommend we change to 1 of those.  Another option is to add BuSpar.  Benefits, risks, side effects were discussed and he accepts.  Continue Xanax 0.5 mg, 1 p.o. twice daily as needed. Start BuSpar 15 mg 1/3 tablet twice daily for 1 week, then increase to 2/3 tablet twice daily for 1 week, then increase to 1  tablet twice daily for anxiety. Continue Cymbalta 30 mg, 1 p.o. daily. Continue trazodone 100 mg, 1-2 nightly as needed sleep. Continue vitamins as per med list. Continue therapy with Rinaldo Cloud, LCSW. Return in 4 to 6 weeks.  Donnal Moat, PA-C

## 2022-05-01 ENCOUNTER — Other Ambulatory Visit: Payer: Self-pay

## 2022-05-01 MED ORDER — FREESTYLE LITE W/DEVICE KIT: PACK | 3 refills | Status: DC

## 2022-05-04 ENCOUNTER — Encounter: Payer: Self-pay | Admitting: Podiatry

## 2022-05-04 ENCOUNTER — Ambulatory Visit (INDEPENDENT_AMBULATORY_CARE_PROVIDER_SITE_OTHER): Payer: Medicare Other | Admitting: Podiatry

## 2022-05-04 DIAGNOSIS — B351 Tinea unguium: Secondary | ICD-10-CM

## 2022-05-04 DIAGNOSIS — E1142 Type 2 diabetes mellitus with diabetic polyneuropathy: Secondary | ICD-10-CM | POA: Diagnosis not present

## 2022-05-04 DIAGNOSIS — M79676 Pain in unspecified toe(s): Secondary | ICD-10-CM | POA: Diagnosis not present
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
## 2022-05-05 ENCOUNTER — Other Ambulatory Visit: Payer: Self-pay

## 2022-05-05 MED ORDER — FREESTYLE LITE TEST VI STRP
ORAL_STRIP | 0 refills | Status: DC
Start: 2022-05-05 — End: 2022-11-02

## 2022-05-05 MED ORDER — FREESTYLE LANCETS MISC: 0 refills | Status: DC

## 2022-05-12 ENCOUNTER — Ambulatory Visit (INDEPENDENT_AMBULATORY_CARE_PROVIDER_SITE_OTHER): Payer: Medicare Other | Admitting: Psychiatry

## 2022-05-12 ENCOUNTER — Telehealth: Payer: Self-pay

## 2022-05-12 DIAGNOSIS — F411 Generalized anxiety disorder: Secondary | ICD-10-CM | POA: Diagnosis not present
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
## 2022-05-12 NOTE — Telephone Encounter (Signed)
-----  Message from April D Calhoun sent at 05/12/2022  1:58 PM EDT ----- Regarding: New Meter Kit and Supplies Patient states he needs a new meter kit and supplies sent to his pharmacy on file please.   Thank you,  April D Calhoun, [place] Pharmacist Assistant [phone]

## 2022-05-12 NOTE — Telephone Encounter (Signed)
Called and lm on pt vm that this was sent to pharmacy on file on 05/01/22.

## 2022-05-20 ENCOUNTER — Encounter: Payer: Self-pay | Admitting: Family Medicine

## 2022-05-20 ENCOUNTER — Ambulatory Visit (INDEPENDENT_AMBULATORY_CARE_PROVIDER_SITE_OTHER): Payer: Medicare Other | Admitting: Family Medicine

## 2022-05-20 VITALS — BP 102/60 | HR 77 | Temp 98.1°F | Ht 69.0 in | Wt 277.4 lb

## 2022-05-20 DIAGNOSIS — E785 Hyperlipidemia, unspecified: Secondary | ICD-10-CM

## 2022-05-20 DIAGNOSIS — E1169 Type 2 diabetes mellitus with other specified complication: Secondary | ICD-10-CM

## 2022-05-20 DIAGNOSIS — Z23 Encounter for immunization: Secondary | ICD-10-CM

## 2022-05-20 DIAGNOSIS — F3342 Major depressive disorder, recurrent, in full remission: Secondary | ICD-10-CM

## 2022-05-20 DIAGNOSIS — E119 Type 2 diabetes mellitus without complications: Secondary | ICD-10-CM

## 2022-05-20 DIAGNOSIS — I10 Essential (primary) hypertension: Secondary | ICD-10-CM

## 2022-05-20 LAB — LIPID PANEL
Cholesterol: 118 mg/dL (ref 0–200)
HDL: 54.3 mg/dL (ref >39.00)
LDL Cholesterol: 52 mg/dL (ref 0–99)
NonHDL: 63.39
Total CHOL/HDL Ratio: 2
Triglycerides: 56 mg/dL (ref 0.0–149.0)
VLDL: 11.2 mg/dL (ref 0.0–40.0)

## 2022-05-20 LAB — CBC WITH DIFFERENTIAL/PLATELET
Basophils Absolute: 0 10*3/uL (ref 0.0–0.1)
Basophils Relative: 0.7 % (ref 0.0–3.0)
Eosinophils Absolute: 0.1 10*3/uL (ref 0.0–0.7)
Eosinophils Relative: 2.1 % (ref 0.0–5.0)
HCT: 40.6 % (ref 39.0–52.0)
Hemoglobin: 13.6 g/dL (ref 13.0–17.0)
Lymphocytes Relative: 16 % (ref 12.0–46.0)
Lymphs Abs: 1 10*3/uL (ref 0.7–4.0)
MCHC: 33.5 g/dL (ref 30.0–36.0)
MCV: 89.6 fl (ref 78.0–100.0)
Monocytes Absolute: 0.6 10*3/uL (ref 0.1–1.0)
Monocytes Relative: 9 % (ref 3.0–12.0)
Neutro Abs: 4.5 10*3/uL (ref 1.4–7.7)
Neutrophils Relative %: 72.2 % (ref 43.0–77.0)
Platelets: 135 10*3/uL — ABNORMAL LOW (ref 150.0–400.0)
RBC: 4.53 Mil/uL (ref 4.22–5.81)
RDW: 14 % (ref 11.5–15.5)
WBC: 6.3 10*3/uL (ref 4.0–10.5)

## 2022-05-20 LAB — COMPREHENSIVE METABOLIC PANEL
ALT: 17 U/L (ref 0–53)
AST: 18 U/L (ref 0–37)
Albumin: 4.2 g/dL (ref 3.5–5.2)
Alkaline Phosphatase: 58 U/L (ref 39–117)
BUN: 20 mg/dL (ref 6–23)
CO2: 23 mEq/L (ref 19–32)
Calcium: 9.4 mg/dL (ref 8.4–10.5)
Chloride: 104 mEq/L (ref 96–112)
Creatinine, Ser: 1.4 mg/dL (ref 0.40–1.50)
GFR: 55.12 mL/min — ABNORMAL LOW (ref >60.00)
Glucose, Bld: 78 mg/dL (ref 70–99)
Potassium: 4.1 mEq/L (ref 3.5–5.1)
Sodium: 138 mEq/L (ref 135–145)
Total Bilirubin: 0.6 mg/dL (ref 0.2–1.2)
Total Protein: 7.6 g/dL (ref 6.0–8.3)

## 2022-05-20 LAB — HEMOGLOBIN A1C: Hgb A1c MFr Bld: 5.9 % (ref 4.6–6.5)

## 2022-05-20 LAB — TSH: TSH: 1.27 u[IU]/mL (ref 0.35–5.50)

## 2022-05-20 MED ORDER — DIMENHYDRINATE 50 MG PO TABS: 50.0000 mg | ORAL_TABLET | Freq: Three times a day (TID) | ORAL | 0 refills | Status: DC | PRN

## 2022-05-20 NOTE — Progress Notes (Signed)
Phone [phone] In person visit   Subjective:   [name] is a 61 y.o. year old very pleasant male patient who presents for/with See problem oriented charting Chief Complaint  Patient presents with   Follow-up   Diabetes   Hypertension   dramamine    Would like Rx as going on cruise 11/1.   Past Medical History-  Patient Active Problem List   Diagnosis Date Noted   Diabetes mellitus without complication ([place]) 81/82/9937    Priority: High   NICM (nonischemic cardiomyopathy) ([place])     Priority: High   Hyperlipidemia associated with type 2 diabetes mellitus ([place]) 05/20/2022    Priority: Medium    Osteoarthritis, knee 11/19/2020    Priority: Medium    Asthma 11/19/2020    Priority: Medium    GAD (generalized anxiety disorder) 11/19/2020    Priority: Medium    GERD (gastroesophageal reflux disease) 11/19/2020    Priority: Medium    Major depressive disorder, recurrent episode, moderate ([place]) 06/16/2018    Priority: Medium    Normal coronary arteries 08/10/2017    Priority: Medium    Sleep apnea 08/10/2017    Priority: Medium    Essential hypertension 11/13/2014    Priority: Medium    Allergic rhinitis 11/19/2020    Priority: Low   Morbid obesity ([place]) 08/10/2017    Priority: Low   Anorectal abscess,left 07/27/2011    Priority: Low    Medications- reviewed and updated Current Outpatient Medications  Medication Sig Dispense Refill   dimenhyDRINATE (DRAMAMINE) 50 MG tablet Take 1 tablet (50 mg total) by mouth every 8 (eight) hours as needed (motion sickness). 30 tablet 0   acetaminophen (TYLENOL) 325 MG tablet Take 2 tablets (650 mg total) by mouth every 6 (six) hours as needed for mild pain (or Fever >/= 101).     albuterol (VENTOLIN HFA) 108 (90 Base) MCG/ACT inhaler INHALE 2 PUFFS INTO THE LUNGS EVERY 4 HOURS AS NEEDED FOR WHEEZING OR SHORTNESS OF BREATH. 18 each 3   ALPRAZolam (XANAX) 0.5 MG tablet Take 1 tablet (0.5 mg total) by mouth 2 (two) times daily  as needed for anxiety. 30 tablet 5   aspirin 81 MG chewable tablet Chew 1 tablet (81 mg total) by mouth daily.     Blood Glucose Monitoring Suppl (FREESTYLE LITE) w/Device KIT Use to test blood sugars daily. Dx: E11.9 1 kit 3   busPIRone (BUSPAR) 15 MG tablet 1/3 tablet twice daily for 1 week, then increase to 2/3 tablet twice daily for 1 week, then increase to 1 tablet twice daily for anxiety. 60 tablet 1   carvedilol (COREG) 12.5 MG tablet Take 1 tablet (12.5 mg total) by mouth 2 (two) times daily. 180 tablet 3   Dulaglutide (TRULICITY) 1.5 JI/9.6VE SOPN Inject 1.5 mg into the skin once a week. 5 mL 5   DULoxetine (CYMBALTA) 30 MG capsule Take 1 capsule (30 mg total) by mouth daily. 90 capsule 1   empagliflozin (JARDIANCE) 25 MG TABS tablet Take 1 tablet (25 mg total) by mouth daily before breakfast. 30 tablet 5   ENTRESTO 97-103 MG TAKE 1 TABLET BY MOUTH TWICE A DAY 60 tablet 11   fluticasone-salmeterol (ADVAIR DISKUS) 250-50 MCG/ACT AEPB Inhale 1 puff into the lungs in the morning and at bedtime. 3 each 3   furosemide (LASIX) 40 MG tablet Take 40 mg by mouth daily.     glucose blood (FREESTYLE LITE) test strip USE 1 STRIP TO CHECK GLUCOSE ONCE DAILY. Dx:  E11.9 100 each 0   Lancets (FREESTYLE) lancets USE 1  TO CHECK GLUCOSE ONCE DAILY. Dx: E11.9 100 each 0   Multiple Vitamin (MULTIVITAMIN) tablet Take 1 tablet by mouth daily.     NON FORMULARY 1 each by Other route See admin instructions. Use CPAP machine nightly.     omeprazole (PRILOSEC) 40 MG capsule Take 1 capsule (40 mg total) by mouth daily. 90 capsule 3   rosuvastatin (CRESTOR) 20 MG tablet TAKE 1 TABLET BY MOUTH EVERY DAY 90 tablet 3   spironolactone (ALDACTONE) 25 MG tablet Take 0.5 tablets (12.5 mg total) by mouth daily. 45 tablet 3   traZODone (DESYREL) 100 MG tablet Take 1-2 tablets (100-200 mg total) by mouth at bedtime as needed. 180 tablet 1   No current facility-administered medications for this visit.     Objective:  BP  102/60   Pulse 77   Temp 98.1 F (36.7 C)   Ht '5\' 9"'  (1.753 m)   Wt 277 lb 6.4 oz (125.8 kg)   SpO2 97%   BMI 40.96 kg/m  Gen: NAD, resting comfortably CV: RRR no murmurs rubs or gallops Lungs: CTAB no crackles, wheeze, rhonchi Ext: minimal edema Skin: warm, dry     Assessment and Plan   # Upcoming cruise-patient requests Dramamine rescription-this was provided today   # Diabetes S: Medication:Trulicity 1.5 mg and Jardiance 25 mg CBGs- 75 this am Exercise and diet- trying to watch appetite/push back from table. Exercise hard with joints.   Lab Results  Component Value Date   HGBA1C 6.9 (H) 09/29/2021   HGBA1C 6.6 (H) 04/18/2021   HGBA1C 9.2 (H) 11/19/2020  A/P:  hopefully stable- update A1c today. Continue current meds for now   #Nonischemic cardiomyopathy #hypertension S: medication: Carvedilol 6.25 mg twice daily, Entresto 97-103 mg, spironolactone 12.5 mg daily -  Lasix 40 mg as needed only -No increased edema and has had significant weight loss - congratulated on efforts  BP Readings from Last 3 Encounters:  05/20/22 102/60  10/23/21 138/82  09/29/21 118/80   A/P: cardiomyopathy stable/euvolemic- continue current meds HTN- well controlled - no orthostatic sypmotms- continue current meds - if gets lightheaded needs to let me know- may need reduction  #hyperlipidemia S: Medication:Rosuvastatin 20 mg, also on aspirin for primary prevention Lab Results  Component Value Date   CHOL 174 11/19/2020   HDL 47.10 11/19/2020   LDLCALC 110 (H) 11/19/2020   LDLDIRECT 66.0 04/18/2021   TRIG 83.0 11/19/2020   CHOLHDL 4 11/19/2020    A/P: hopefully stable or improved- update lipids today - feels weight loss is substantial- intentional but wants to make sure thyroid ok- check with labs  #morbid obesity- noted with BMI over 40- Encouraged need for healthy eating/portion scontrol, regular exercise (even if in chair), and  weight loss continuation  #Depression/generalized  anxiety disorder-managed by [facility] S: Medication: Managed by psychiatry with Cymbalta 30 mg BID, alprazolam 0.5 mg twice daily as needed, trazodone 100 to 200 mg before bed for insomnia as well as buspirone    05/20/2022    9:20 AM 11/10/2021   11:08 AM 09/29/2021    1:12 PM  Depression screen PHQ 2/9  Decreased Interest 0 0 0  Down, Depressed, Hopeless 0 0 0  PHQ - 2 Score 0 0 0  Altered sleeping 0  2  Tired, decreased energy 0  0  Change in appetite 0  0  Feeling bad or failure about yourself  0  0  Trouble  concentrating 0  0  Moving slowly or fidgety/restless 0  0  Suicidal thoughts 0  0  PHQ-9 Score 0  2  Difficult doing work/chores Not difficult at all  Not difficult at all  A/P: depression reasonable control. GAD with mild poor control and recent adjustments- continue close follow up with psychiatry. No SI>    Recommended follow up: Return in about 6 months (around 11/18/2022) for physical or sooner if needed.Schedule b4 you leave. Future Appointments  Date Time Provider [place]  06/09/2022 10:30 AM Addison Lank, PA-C [facility] None  06/25/2022 11:00 AM Shanon Ace, LCSW [facility] None  07/15/2022  1:30 PM Ward Givens, NP [facility] None  08/05/2022  8:45 AM Milinda Pointer, Max T, DPM [facility]  11/23/2022 11:00 AM [facility] HEALTH COACH [facility]   Lab/Order associations:   ICD-10-CM   1. Diabetes mellitus without complication (HCC)  T95.3 CBC with Differential/Platelet    Comprehensive metabolic panel    Lipid panel    Hemoglobin A1c    2. Hyperlipidemia associated with type 2 diabetes mellitus (HCC)  E11.69 CBC with Differential/Platelet   E78.5 Comprehensive metabolic panel    TSH    3. Essential hypertension  I10     4. Recurrent major depressive disorder, in full remission ([place]) Chronic F33.42     5. Morbid obesity ([place]) Chronic E66.01     6. Need for immunization against influenza  Z23 Flu Vaccine QUAD 47moIM (Fluarix, Fluzone & Alfiuria Quad PF)      Meds ordered this encounter  Medications   dimenhyDRINATE (DRAMAMINE) 50 MG tablet    Sig: Take 1 tablet (50 mg total) by mouth every 8 (eight) hours as needed (motion sickness).    Dispense:  30 tablet    Refill:  0   Return precautions advised.  SGarret Reddish MD

## 2022-05-20 NOTE — Patient Instructions (Addendum)
Have diabetic eye exam sent to [place] at [phone].  Please stop by lab before you go If you have mychart- we will send your results within 3 business days of [place] receiving them.  If you do not have mychart- we will call you about results within 5 business days of [place] receiving them.  *please also note that you will see labs on mychart as soon as they post. I will later go in and write notes on them- will say "notes from Dr. Yong Channel"   Recommended follow up: Return in about 6 months (around 11/18/2022) for physical or sooner if needed.Schedule b4 you leave.

## 2022-05-26 ENCOUNTER — Other Ambulatory Visit: Payer: Self-pay | Admitting: Physician Assistant

## 2022-06-09 ENCOUNTER — Telehealth: Payer: Medicare Other | Admitting: Physician Assistant

## 2022-06-14 ENCOUNTER — Other Ambulatory Visit: Payer: Self-pay | Admitting: Cardiology

## 2022-06-14 DIAGNOSIS — I420 Dilated cardiomyopathy: Secondary | ICD-10-CM

## 2022-06-14 DIAGNOSIS — I10 Essential (primary) hypertension: Secondary | ICD-10-CM

## 2022-06-14 DIAGNOSIS — E7800 Pure hypercholesterolemia, unspecified: Secondary | ICD-10-CM

## 2022-06-14 DIAGNOSIS — I509 Heart failure, unspecified: Secondary | ICD-10-CM

## 2022-06-15 ENCOUNTER — Telehealth: Payer: Self-pay | Admitting: Family Medicine

## 2022-06-15 NOTE — Telephone Encounter (Signed)
Place referral to ortho or do you want to see pt for this first?

## 2022-06-15 NOTE — Telephone Encounter (Signed)
With reduced kidney function for his age-NSAIDs are contraindicated though he could try topical Voltaren gel over-the-counter 4 times a day for 2 weeks to see if that will calm the knee pain down.  I know he has seen [name] in the past with orthopedics. - Can use Tylenol arthritis as well in combination with Voltaren gel  Beyond that we would be looking at narcotics and I would definitely require an office visit to discuss and likely is not the best long-term solution

## 2022-06-15 NOTE — Telephone Encounter (Signed)
Tylenol arthritis may not be perfect but it would be a reasonable solution.  Please tell him we can discuss possible narcotics at his next visit but once again not the most desirable solution.  Weight loss can be beneficial for knee pain and we could also refer him to physical therapy if he would like to work on the surrounding muscles to protect the knee

## 2022-06-15 NOTE — Telephone Encounter (Signed)
Caller states: - Patient is having knee pain once more but doesn't have anything to help with this   Caller requests: -PCP send in something that could help with his knee pain   I offered caller an OV for re-evaluation but she declined.

## 2022-06-15 NOTE — Telephone Encounter (Signed)
[name] and spoke with pt and he states he has bone on bone and Voltaren does not work for him, he states he can not afford [name] due to being charged $400 each time he sees him. He declined visit and thank you anyway.

## 2022-06-23 ENCOUNTER — Ambulatory Visit: Payer: Medicare Other | Admitting: Psychiatry

## 2022-06-25 ENCOUNTER — Ambulatory Visit: Payer: Medicare Other | Admitting: Psychiatry

## 2022-07-01 ENCOUNTER — Telehealth: Payer: Self-pay | Admitting: Family Medicine

## 2022-07-01 NOTE — Telephone Encounter (Signed)
Pt's spouse stated Advair is no longer covered under his patient assistance program. They will cover [place]. She states we need to fax them at [phone] with a prescription and a cover sheet. If any questions, please call pt or spouse.

## 2022-07-02 MED ORDER — FLUTICASONE FUROATE-VILANTEROL 100-25 MCG/ACT IN AEPB: 1.0000 | INHALATION_SPRAY | Freq: Every day | RESPIRATORY_TRACT | 11 refills | Status: DC

## 2022-07-02 NOTE — Telephone Encounter (Signed)
Ok to switch 

## 2022-07-02 NOTE — Telephone Encounter (Signed)
Rx has been faxed to requested # below.

## 2022-07-02 NOTE — Telephone Encounter (Signed)
Printing breo to fax

## 2022-07-14 ENCOUNTER — Encounter: Payer: Self-pay | Admitting: *Deleted

## 2022-07-14 ENCOUNTER — Telehealth: Payer: Medicare Other | Admitting: Adult Health

## 2022-07-15 ENCOUNTER — Telehealth: Payer: Medicare Other | Admitting: Adult Health

## 2022-07-21 ENCOUNTER — Telehealth: Payer: Medicare Other | Admitting: Physician Assistant

## 2022-08-05 ENCOUNTER — Ambulatory Visit: Payer: Medicare Other | Admitting: Podiatry

## 2022-08-11 ENCOUNTER — Ambulatory Visit (INDEPENDENT_AMBULATORY_CARE_PROVIDER_SITE_OTHER): Payer: Medicare Other | Admitting: Psychiatry

## 2022-08-11 DIAGNOSIS — F3341 Major depressive disorder, recurrent, in partial remission: Secondary | ICD-10-CM | POA: Diagnosis not present

## 2022-08-11 NOTE — Progress Notes (Signed)
[facility] Counselor/Therapist Progress Note  Patient ID: [name], MRN: [id number],    Date: 08/11/2022  Time Spent: 48 minutes   Virtual Visit via Telehealth Note: MyChart Video session Connected with patient by a telemedicine/telehealth application, with their informed consent, and verified patient privacy and that I am speaking with the correct person using two identifiers. I discussed the limitations, risks, security and privacy concerns of performing psychotherapy and the availability of in person appointments. I also discussed with the patient that there may be a patient responsible charge related to this service. The patient expressed understanding and agreed to proceed. I discussed the treatment planning with the patient. The patient was provided an opportunity to ask questions and all were answered. The patient agreed with the plan and demonstrated an understanding of the instructions. The patient was advised to call  our office if  symptoms worsen or feel they are in a crisis state and need immediate contact.   Therapist Location: office Patient Location: home  Treatment Type: Individual Therapy  Reported Symptoms: depression, anxiety, sadness re: mom declining health due to cancer and her aging  Mental Status Exam:  Appearance:   Casual     Behavior:  Appropriate, Sharing, and Motivated  Motor:  Normal  Speech/Language:   Clear and Coherent  Affect:  Depressed and anxious, sad  Mood:  anxious, depressed, and sad  Thought process:  goal directed  Thought content:    WNL  Sensory/Perceptual disturbances:    WNL  Orientation:  oriented to person, place, time/date, situation, day of week, month of year, year, and stated date of Dec. 5, 2023  Attention:  Good  Concentration:  Good and Fair  Memory:  WNL  Fund of knowledge:   Good  Insight:    Good  Judgment:   Good  Impulse Control:  Good   Risk Assessment: Danger to Self:  No Self-injurious Behavior:  No Danger to Others: No Duty to Warn:no Physical Aggression / Violence:No  Access to Firearms a concern: No  Gang Involvement:No   Subjective:  Patient reporting continued efforts to lose weight in healthy manner. Last appt weighed 277 lbs and now reports weighting 264 lbs and feeling some better but still have "my aches and pains." Not really much of an appetite and says he feels "it's related to having Covid earlier in 2023 and was really sick during that time." Rates his anxiety as a "6" on a 1-10 scale. No panic attacks since last appt.  Rates depression currently as a "7" on 1-10 scale. Daughter with whom there have been relationship issues, is "coming around a little more and bringing grandson to visit also". Does walk in house, not as much outdoors as they've had more stray dogs lately. Still not much of any sense of taste nor smell "after covid." Concerned about 66 yr old mother who has cancer in [place] and processed feelings in more detail. Overall "picture of family relationships" is some better but hard to know "what will happen due to family history of difficulty relating with certain family members." "We are proceeding with caution and hoping for the best.". Sleep is "pretty good." Does not get out daily more than 2-3 times per week . Daughters helping with shopping. Some better at not getting stuck in sadness, and that is encouraging. Worked more today on managing mood issues especially depression, what helps and what does not help, setting limits with others as needed, and paying more attention to  his own health and choices made that impact his health.   Interventions: Cognitive Behavioral Therapy, Solution-Oriented/Positive Psychology, and Ego-Supportive  Long term goal: Develop the ability to recognize, accept, and cope with feelings of depression/anxiety. Short term goal: Identify and replace depressive/anxious thinking that leads to depressive/anxious feelings and  actions. Strategy: Educate patient about cognitive restructuring, including self-monitoring of automatic thoughts, and working to interrupt his depressive/anxious thought patterns and replace with cognitive patterns that do not support depression or anxiety.  Diagnosis:   ICD-10-CM   1. Recurrent major depressive disorder, in partial remission ([place])  F33.41      Plan: Patient today continues with good motivation and actively involved in his treatment goals related to his emotional health but his goals are also helping address his overall physical health as well.  Mood is some better.  Able to laugh some.  Still not getting out as much however and states he averages getting out of the house 2-3 times a week.  He has continued to lose significant weight although gradually which she feels is helping his overall health status but plans to check with his doctor at an upcoming appointment soon about his continued loss of appetite which she has noticed since having COVID earlier this year.  He just wants to make sure that the weight loss is healthy and that there is no underlying physical issues.  Continues to work with goal-directed behaviors regarding his depression and anxiety and making noticeable progress as evidenced by self talk, improved self-care, working to have healthier boundaries with others as needed, and his mood that does include more happiness and some hopefulness at times.  Processed some additional family changes which tend to be more positive than negative.  Focusing more on what he can change versus cannot change.  (Not all details included in this note due to patient privacy needs). Encouraged patient in his practice of more positive behaviors as noted in session including: Intentionally looking for more positives each day, staying in the present rather than the future or the past, focusing on what he can control versus cannot, positive self talk, staying in contact with people who are  supportive, letting his faith be a resource for his emotional health as well as spiritual, healthy boundaries with others, and recognize the strength he shows working with goal-directed behaviors to move in a direction that supports his improved emotional health and overall wellbeing.  Goal review and progress/challenges noted with patient.  Next appointment within 1 month.  This record has been created using [employer].  Chart creation errors have been sought, but may not always have been located and corrected.  Such creation errors do not reflect on the standard of medical care provided.   Shanon Ace, LCSW

## 2022-08-12 ENCOUNTER — Telehealth: Payer: Medicare Other | Admitting: Physician Assistant

## 2022-08-15 ENCOUNTER — Other Ambulatory Visit: Payer: Self-pay | Admitting: Family Medicine

## 2022-08-16 ENCOUNTER — Other Ambulatory Visit: Payer: Self-pay | Admitting: Internal Medicine

## 2022-08-30 ENCOUNTER — Other Ambulatory Visit: Payer: Self-pay | Admitting: Physician Assistant

## 2022-09-03 NOTE — Telephone Encounter (Signed)
Please schedule appt

## 2022-09-12 ENCOUNTER — Other Ambulatory Visit: Payer: Self-pay | Admitting: Cardiology

## 2022-09-12 DIAGNOSIS — I10 Essential (primary) hypertension: Secondary | ICD-10-CM

## 2022-09-12 DIAGNOSIS — I420 Dilated cardiomyopathy: Secondary | ICD-10-CM

## 2022-09-12 DIAGNOSIS — I509 Heart failure, unspecified: Secondary | ICD-10-CM

## 2022-09-12 DIAGNOSIS — E7800 Pure hypercholesterolemia, unspecified: Secondary | ICD-10-CM

## 2022-09-17 ENCOUNTER — Other Ambulatory Visit: Payer: Self-pay | Admitting: Cardiology

## 2022-09-17 DIAGNOSIS — I5042 Chronic combined systolic (congestive) and diastolic (congestive) heart failure: Secondary | ICD-10-CM

## 2022-09-18 NOTE — Telephone Encounter (Signed)
LVM to schedule appt.

## 2022-09-23 ENCOUNTER — Ambulatory Visit: Payer: Medicare Other | Admitting: Podiatry

## 2022-10-01 ENCOUNTER — Ambulatory Visit (INDEPENDENT_AMBULATORY_CARE_PROVIDER_SITE_OTHER): Payer: Medicare Other | Admitting: Psychiatry

## 2022-10-01 DIAGNOSIS — F3341 Major depressive disorder, recurrent, in partial remission: Secondary | ICD-10-CM

## 2022-10-01 NOTE — Progress Notes (Signed)
[facility] Counselor/Therapist Progress Note  Patient ID: [name], MRN: [id number],    Date: 10/01/2022  Time Spent: 48 minutes  Virtual Visit via Telehealth Note: My Chart Video Connected with patient by a telemedicine/telehealth application, with their informed consent, and verified patient privacy and that I am speaking with the correct person using two identifiers. I discussed the limitations, risks, security and privacy concerns of performing psychotherapy and the availability of in person appointments. I also discussed with the patient that there may be a patient responsible charge related to this service. The patient expressed understanding and agreed to proceed. I discussed the treatment planning with the patient. The patient was provided an opportunity to ask questions and all were answered. The patient agreed with the plan and demonstrated an understanding of the instructions. The patient was advised to call  our office if  symptoms worsen or feel they are in a crisis state and need immediate contact.   Therapist Location: office Patient Location: home  Treatment Type: Individual Therapy  Reported Symptoms: depression, anxiety improving  Mental Status Exam:  Appearance:   Casual     Behavior:  Appropriate, Sharing, and Motivated  Motor:  Normal  Speech/Language:   Clear and Coherent  Affect:  Depressed  Mood:  anxious and depressed  Thought process:  goal directed  Thought content:    WNL  Sensory/Perceptual disturbances:    WNL  Orientation:  oriented to person, place, time/date, situation, day of week, month of year, year, and stated date of Jan. 25, 2024  Attention:  Fair  Concentration:  Fair  Memory:  [place] of knowledge:   Good  Insight:    Good and Fair  Judgment:   Good  Impulse Control:  Good   Risk Assessment: Danger to Self:  No Self-injurious Behavior: No Danger to Others: No Duty to Warn:no Physical Aggression / Violence:No   Access to Firearms a concern: No  Gang Involvement:No   Subjective: Patient today reporting depression and anxiety with some improvement noted. Has maintained his weight loss. Appetite better past month.  Reporting arthritis in shoulders and hands, and "bone on bone knees" but "I don't want to get cut on and have surgery yet."  If the pain gets unbearable I may consider surgery. Injections have become pricey $300-400/  Uses knee braces helps. Today rating his anxiety as a "7-8" due to family deaths recently and "my 12 yr old mom has cancer and patient having significant sadness about this, "on top of everything else happening within the family." Rates his depression as  a 5-6".  Relationship with daughter that has been problematic, is getting some better and he feels cautiously optimistic about their relationship improving some and being able to see his 93 yr old grandson. Gets outside the home at least twice per week. Sleep is good most recently. Not able to walk long distances due to knee pain but does try to walk some each day inside the house.  Interventions: Cognitive Behavioral Therapy, Ego-Supportive, and Grief Therapy  Long term goal: Develop the ability to recognize, accept, and cope with feelings of depression/anxiety. Short term goal: Identify and replace depressive/anxious thinking that leads to depressive/anxious feelings and actions. Strategy: Educate patient about cognitive restructuring, including self-monitoring of automatic thoughts, and working to interrupt his depressive/anxious thought patterns and replace with cognitive patterns that do not support depression or anxiety.  Diagnosis:   ICD-10-CM   1. Recurrent major depressive disorder, in partial  remission ([place])  F33.41      Plan:  Patient today working on his depression and some anxiety, reporting some decrease in anxiety but depression has been a little more "problematic" mostly due to family stressors of serious  illnesses and a death. Reports feeling motivated and working on treatment goals to improved. With his weight loss, he shares that his doctor feels his overall health has improved some. Being careful "about Covid" as he he "doesn't want to get it again."  Feels he is dealing better with the loss of his brother recently as  they had been very close. Openly sharing today his family concerns and seemed to appreciate the support. Is making progress and needs to continue with goal-directed behaviors to continue his progress in moving forward in healthier ways.Encouraged patient in practicing more positive behaviors as noted in session including: Intentionally looking for more positives each day, staying in the present rather than the future or the past, focusing what he can control versus cannot, positive self talk, staying in contact with people who are supportive, letting his faith be a resource for his emotional health as well as spiritual, healthy boundaries with others, and feel good about the strength he shows working with goal-directed behaviors to move in a direction that supports his improved emotional health and outlook.  Goal review and progress/challenges noted with patient.  Next appointment within 4 to 5 weeks.  This record has been created using [employer].  Chart creation errors have been sought, but may not always have been located and corrected.  Such creation errors do not reflect on the standard of medical care provided.   Shanon Ace, LCSW

## 2022-10-12 ENCOUNTER — Other Ambulatory Visit: Payer: Self-pay | Admitting: Physician Assistant

## 2022-10-12 NOTE — Telephone Encounter (Signed)
Call to RS

## 2022-10-13 NOTE — Telephone Encounter (Signed)
Apt 3/8
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
## 2022-10-21 ENCOUNTER — Telehealth: Payer: Self-pay | Admitting: Cardiology

## 2022-10-21 NOTE — Telephone Encounter (Signed)
Patient calling the office for samples of medication:   1.  What medication and dosage are you requesting samples for?  ENTRESTO 97-103 MG   2.  Are you currently out of this medication? Yes

## 2022-10-21 NOTE — Telephone Encounter (Signed)
Spoke with patient's wife. He has not yet met drug deductible for 2024 so rx for Delene Loll is about $300. Wife is asking if he may could get samples of lower dose and double up.   She is unsure if he is still on Jardiance, gets patient assistance for Trulicity, and said she applied for patient assistance for Entresto but was denied - cannot locate record of this.   Will send PAP link via MyChart  Will send message to MD/PharmD team about samples

## 2022-10-22 MED ORDER — ENTRESTO 49-51 MG PO TABS: 2.0000 | ORAL_TABLET | Freq: Two times a day (BID) | ORAL | 0 refills | Status: DC

## 2022-10-22 NOTE — Telephone Encounter (Signed)
Provided 14 days worth of samples. Wife is informed.

## 2022-10-28 ENCOUNTER — Ambulatory Visit: Payer: Medicare Other | Admitting: Podiatry

## 2022-10-30 ENCOUNTER — Encounter: Payer: Self-pay | Admitting: Cardiology

## 2022-10-30 ENCOUNTER — Ambulatory Visit: Payer: Medicare Other | Attending: Cardiology | Admitting: Cardiology

## 2022-10-30 VITALS — BP 110/78 | HR 67 | Ht 70.0 in | Wt 286.6 lb

## 2022-10-30 DIAGNOSIS — I420 Dilated cardiomyopathy: Secondary | ICD-10-CM | POA: Diagnosis not present

## 2022-10-30 DIAGNOSIS — I509 Heart failure, unspecified: Secondary | ICD-10-CM | POA: Diagnosis not present

## 2022-10-30 DIAGNOSIS — E7800 Pure hypercholesterolemia, unspecified: Secondary | ICD-10-CM | POA: Diagnosis not present

## 2022-10-30 DIAGNOSIS — I10 Essential (primary) hypertension: Secondary | ICD-10-CM | POA: Diagnosis not present

## 2022-10-30 LAB — BASIC METABOLIC PANEL
BUN/Creatinine Ratio: 16 (ref 9–20)
BUN: 23 mg/dL (ref 6–24)
CO2: 24 mmol/L (ref 20–29)
Calcium: 9.8 mg/dL (ref 8.7–10.2)
Chloride: 100 mmol/L (ref 96–106)
Creatinine, Ser: 1.46 mg/dL — ABNORMAL HIGH (ref 0.76–1.27)
Glucose: 135 mg/dL — ABNORMAL HIGH (ref 70–99)
Potassium: 4.9 mmol/L (ref 3.5–5.2)
Sodium: 136 mmol/L (ref 134–144)
eGFR: 55 mL/min/{1.73_m2} — ABNORMAL LOW (ref >59)

## 2022-10-30 NOTE — Patient Instructions (Signed)
  Testing/Procedures:  Your physician has requested that you have an echocardiogram. Echocardiography is a painless test that uses sound waves to create images of your heart. It provides your doctor with information about the size and shape of your heart and how well your heart's chambers and valves are working. This procedure takes approximately one hour. There are no restrictions for this procedure. Please do NOT wear cologne, perfume, aftershave, or lotions (deodorant is allowed). Please arrive 15 minutes prior to your appointment time. [address]   Follow-Up: At [facility], you and your health needs are our priority.  As part of our continuing mission to provide you with exceptional heart care, we have created designated Provider Care Teams.  These Care Teams include your primary Cardiologist (physician) and Advanced Practice Providers (APPs -  Physician Assistants and Nurse Practitioners) who all work together to provide you with the care you need, when you need it.  We recommend signing up for the patient portal called "MyChart".  Sign up information is provided on this After Visit Summary.  MyChart is used to connect with patients for Virtual Visits (Telemedicine).  Patients are able to view lab/test results, encounter notes, upcoming appointments, etc.  Non-urgent messages can be sent to your provider as well.   To learn more about what you can do with MyChart, go to [email or web address].    Your next appointment:   12 month(s)  Provider:   Brian Crenshaw, MD     

## 2022-11-02 ENCOUNTER — Other Ambulatory Visit: Payer: Self-pay | Admitting: Family Medicine

## 2022-11-12 ENCOUNTER — Ambulatory Visit (INDEPENDENT_AMBULATORY_CARE_PROVIDER_SITE_OTHER): Payer: Medicare Other | Admitting: Psychiatry

## 2022-11-12 DIAGNOSIS — F3341 Major depressive disorder, recurrent, in partial remission: Secondary | ICD-10-CM | POA: Diagnosis not present

## 2022-11-12 NOTE — Progress Notes (Signed)
[facility] Counselor/Therapist Progress Note  Patient ID: [name], MRN: [id number],    Date: 11/12/2022  Time Spent: 45 minutes   Treatment Type: Individual Therapy  Virtual Visit via Telehealth Note: MyChart Video Connected with patient by a telemedicine/telehealth application, with their informed consent, and verified patient privacy and that I am speaking with the correct person using two identifiers. I discussed the limitations, risks, security and privacy concerns of performing psychotherapy and the availability of in person appointments. I also discussed with the patient that there may be a patient responsible charge related to this service. The patient expressed understanding and agreed to proceed. I discussed the treatment planning with the patient. The patient was provided an opportunity to ask questions and all were answered. The patient agreed with the plan and demonstrated an understanding of the instructions. The patient was advised to call  our office if  symptoms worsen or feel they are in a crisis state and need immediate contact.   Therapist Location: office Patient Location: home   Reported Symptoms: anxious, some depression, back pain creating difficulty sleeping  Mental Status Exam:  Appearance:   Casual     Behavior:  Appropriate, Sharing, and Motivated  Motor:  Normal (affected by back pain recently)  Speech/Language:   Clear and Coherent  Affect:  Depressed and anxious  Mood:  anxious and depressed  Thought process:  goal directed  Thought content:    WNL  Sensory/Perceptual disturbances:    WNL  Orientation:  oriented to person, place, time/date, situation, day of week, month of year, year, and stated date of November 12, 2022  Attention:  Good  Concentration:  Good  Memory:  Some occasional short term memory issues and Dr is aware  Fund of knowledge:   Good  Insight:    Good  Judgment:   Good  Impulse Control:  Good   Risk Assessment: Danger  to Self:  No Self-injurious Behavior: No Danger to Others: No Duty to Warn:no Physical Aggression / Violence:No  Access to Firearms a concern: No  Gang Involvement:No   Subjective:  Patient today reporting anxiety and depression related to his recent increased back pain and seeing doctor, personal financial stresses, and his 60 yr mother in [place] who was in rehab facility ran out of insurance and had to be discharged but is getting in home assistance. Patient "feeling a lot of stress, anxiety, and depression just about mother's situation." Needed time in session today to more fully process his relationship with mother and how her current health (Physical and Mental) "have really changed her physically and emotionally, often not recognizing patient." Patient feeling very sad over the circumstances and realizing he can't change it for his mom . Has had a couple "anxiety attacks" recently "but was able to manage them pretty well". States he is dealing some better with the brother's death that was the most recent family death. Reports he gained 7 lbs so "now I'm not as concerned about losing so much weight since I know I can gain some also. Still having a more positive relationship with daughter that "had been a challenge to deal with, and is having more contact with grandson who live with that daughter and her husband, although  "husband has no contact with patient and wife."  Interventions: Cognitive Behavioral Therapy and Ego-Supportive  Long term goal: Develop the ability to recognize, accept, and cope with feelings of depression/anxiety. Short term goal: Identify and replace depressive/anxious thinking that  leads to depressive/anxious feelings and actions. Strategy: Educate patient about cognitive restructuring, including self-monitoring of automatic thoughts, and working to interrupt his depressive/anxious thought patterns and replace with cognitive patterns that do not support depression or  anxiety.  Diagnosis:   ICD-10-CM   1. Recurrent major depressive disorder, in partial remission ([place])  F33.41      Plan:  Patient today continues work on his anxiety and depression especially related to some family issues with an adult daughter, his recent increase in back pain, personal financial stressors, and his 60 year old mother who ran out of insurance and had to leave a rehab facility but is now getting in-home assistance.  Patient worries more about her as she lives up in [place] and he is not able to travel where. Felt he really needed session today to help process some of his family relationship issues and work on his increased stress, anxiety, and depression and how to better handle them.  Encouraged by an improved relationship with one of his daughters which is led to him having more contact with his 37 year old grandson which has been a really good thing both for patient and his wife and also the grandson.  Patient is showing good effort with with managing multiple stressors and is very appreciative of being able to have the phone call today and receive emotional support as well as information on strategies that can help him cope better with his symptoms and stressors.  Needs to continue his work with goal-directed behaviors in order to receive much-needed support to keep moving forward in a positive direction. Encouraged patient in his practice of more positive and self affirming behaviors as noted in session including: Intentionally looking for more positives daily, remaining in the present rather than the store too far into the future, focusing on what he can control versus cannot, positive self talk, remain in contact with people who are supportive, letting his faith be a resource for his emotional health as well as spiritual, healthy boundaries with others, and feel good about the strength he shows working with goal-directed behaviors  Self rated scales: 1-10 depression scale-  "6/7" 1-10 anxiety scale- "6/7"  Goal review and progress/challenges noted with patient.  Next appt within approximately 4 to 6 weeks.  This record has been created using [employer].  Chart creation errors have been sought, but may not always have been located and corrected.  Such creation errors do not reflect on the standard of medical care provided.   Shanon Ace, LCSW

## 2022-11-13 ENCOUNTER — Encounter: Payer: Self-pay | Admitting: Physician Assistant

## 2022-11-13 ENCOUNTER — Telehealth (INDEPENDENT_AMBULATORY_CARE_PROVIDER_SITE_OTHER): Payer: Medicare Other | Admitting: Physician Assistant

## 2022-11-13 DIAGNOSIS — F411 Generalized anxiety disorder: Secondary | ICD-10-CM | POA: Diagnosis not present

## 2022-11-13 DIAGNOSIS — G4700 Insomnia, unspecified: Secondary | ICD-10-CM

## 2022-11-13 DIAGNOSIS — F3341 Major depressive disorder, recurrent, in partial remission: Secondary | ICD-10-CM | POA: Diagnosis not present

## 2022-11-13 MED ORDER — DULOXETINE HCL 30 MG PO CPEP: 30.0000 mg | ORAL_CAPSULE | Freq: Every day | ORAL | 1 refills | Status: DC

## 2022-11-13 MED ORDER — ALPRAZOLAM 0.5 MG PO TABS: 0.5000 mg | ORAL_TABLET | Freq: Two times a day (BID) | ORAL | 5 refills | Status: DC | PRN

## 2022-11-13 NOTE — Progress Notes (Signed)
[facility] Med Check  Patient ID: [name],  MRN: [id number]  PCP: Marin Olp, MD  Date of Evaluation: 11/13/2022 Time spent:20 minutes  Chief Complaint:    Virtual Visit via Telehealth  I connected with patient by a video enabled telemedicine application with their informed consent, and verified patient privacy and that I am speaking with the correct person using two identifiers.  I am private, in my office and the patient is at home.  I discussed the limitations, risks, security and privacy concerns of performing an evaluation and management service by  video and the availability of in person appointments. I also discussed with the patient that there may be a patient responsible charge related to this service. The patient expressed understanding and agreed to proceed.   I discussed the assessment and treatment plan with the patient. The patient was provided an opportunity to ask questions and all were answered. The patient agreed with the plan and demonstrated an understanding of the instructions.   The patient was advised to call back or seek an in-person evaluation if the symptoms worsen or if the condition fails to improve as anticipated.  I provided 20  minutes of non-face-to-face time during this encounter.  HISTORY/CURRENT STATUS: HPI for routine med check  [name] states he is doing pretty well.  We started BuSpar 6 months ago but he was not able to tolerate it due to headaches.  He does take the Xanax occasionally and it is effective.  He is not typically having panic attacks though.  He does have generalized anxiety, his mom in [place] has vaginal cancer and is not doing well.  He worries about her and tries to help out as much as he can from here.  Energy and motivation are good most of the time.  No extreme sadness, tearfulness, or feelings of hopelessness.  No anhedonia.  Sleeps well, takes trazodone every night.  ADLs and personal hygiene are normal.    Denies any changes in concentration, making decisions, or remembering things.  Appetite has not changed.  Weight is stable.  Denies suicidal or homicidal thoughts.  Patient denies increased energy with decreased need for sleep, increased talkativeness, racing thoughts, impulsivity or risky behaviors, increased spending, increased libido, grandiosity, increased irritability or anger, paranoia, or hallucinations.  Denies dizziness, syncope, seizures, numbness, tingling, tremor, tics, unsteady gait, slurred speech, confusion. Denies muscle or joint pain, stiffness, or dystonia.  Individual Medical History/ Review of Systems: Changes? :Yes   hurt his back recently, under care of physician  Past medications for mental health diagnoses include: Cymbalta, Trazodone, Xanax, BuSpar caused headaches  Allergies: Shellfish allergy and Buspar [buspirone]  Current Medications:  Current Outpatient Medications:    acetaminophen (TYLENOL) 325 MG tablet, Take 2 tablets (650 mg total) by mouth every 6 (six) hours as needed for mild pain (or Fever >/= 101)., Disp: , Rfl:    albuterol (VENTOLIN HFA) 108 (90 Base) MCG/ACT inhaler, INHALE 2 PUFFS BY MOUTH EVERY 4 HOURS AS NEEDED FOR WHEEZE OR FOR SHORTNESS OF BREATH, Disp: 18 each, Rfl: 3   aspirin 81 MG chewable tablet, Chew 1 tablet (81 mg total) by mouth daily., Disp: , Rfl:    Blood Glucose Monitoring Suppl (FREESTYLE LITE) w/Device KIT, Use to test blood sugars daily. Dx: E11.9, Disp: 1 kit, Rfl: 3   carvedilol (COREG) 12.5 MG tablet, TAKE 1 TABLET BY MOUTH TWICE A DAY, Disp: 180 tablet, Rfl: 0   Dulaglutide (TRULICITY) 1.5 0000000 SOPN, Inject 1.5  mg into the skin once a week., Disp: 5 mL, Rfl: 5   empagliflozin (JARDIANCE) 25 MG TABS tablet, Take 1 tablet (25 mg total) by mouth daily before breakfast., Disp: 30 tablet, Rfl: 5   ENTRESTO 97-103 MG, TAKE 1 TABLET BY MOUTH TWICE A DAY, Disp: 60 tablet, Rfl: 11   fluticasone furoate-vilanterol (BREO ELLIPTA)  100-25 MCG/ACT AEPB, Inhale 1 puff into the lungs daily., Disp: 28 each, Rfl: 11   furosemide (LASIX) 40 MG tablet, Take 40 mg by mouth daily., Disp: , Rfl:    glucose blood (FREESTYLE LITE) test strip, USE 1 STRIP TO CHECK GLUCOSE ONCE DAILY. DX: E11.9, Disp: 100 strip, Rfl: 3   Lancets (FREESTYLE) lancets, USE 1  TO CHECK GLUCOSE ONCE DAILY. Dx: E11.9, Disp: 100 each, Rfl: 0   Multiple Vitamin (MULTIVITAMIN) tablet, Take 1 tablet by mouth daily., Disp: , Rfl:    NON FORMULARY, 1 each by Other route See admin instructions. Use CPAP machine nightly., Disp: , Rfl:    omeprazole (PRILOSEC) 40 MG capsule, TAKE 1 CAPSULE (40 MG TOTAL) BY MOUTH DAILY., Disp: 90 capsule, Rfl: 1   rosuvastatin (CRESTOR) 20 MG tablet, TAKE 1 TABLET BY MOUTH EVERY DAY, Disp: 90 tablet, Rfl: 3   sacubitril-valsartan (ENTRESTO) 49-51 MG, Take 2 tablets by mouth 2 (two) times daily., Disp: 56 tablet, Rfl: 0   spironolactone (ALDACTONE) 25 MG tablet, TAKE 1/2 TABLET BY MOUTH DAILY, Disp: 45 tablet, Rfl: 0   traZODone (DESYREL) 100 MG tablet, Take 1-2 tablets (100-200 mg total) by mouth at bedtime as needed., Disp: 180 tablet, Rfl: 1   ALPRAZolam (XANAX) 0.5 MG tablet, Take 1 tablet (0.5 mg total) by mouth 2 (two) times daily as needed for anxiety., Disp: 30 tablet, Rfl: 5   DULoxetine (CYMBALTA) 30 MG capsule, Take 1 capsule (30 mg total) by mouth daily., Disp: 90 capsule, Rfl: 1 Medication Side Effects: none  Family Medical/ Social History: Changes? No  MENTAL HEALTH EXAM:  There were no vitals taken for this visit.There is no height or weight on file to calculate BMI.  General Appearance: Casual, Well Groomed, and Obese  Eye Contact:  Good  Speech:  Clear and Coherent and Normal Rate  Volume:  Normal  Mood:  Euthymic  Affect:  Congruent  Thought Process:  Goal Directed and Descriptions of Associations: Circumstantial  Orientation:  Full (Time, Place, and Person)  Thought Content: Logical   Suicidal Thoughts:  No   Homicidal Thoughts:  No  Memory:  WNL  Judgement:  Good  Insight:  Good  Psychomotor Activity:  Normal  Concentration:  Concentration: Good  Recall:  Good  Fund of Knowledge: Good  Language: Good  Assets:  Communication Skills Desire for Improvement Financial Resources/Insurance Housing Transportation  ADL's:  Intact  Cognition: WNL  Prognosis:  Good   DIAGNOSES:    ICD-10-CM   1. Recurrent major depressive disorder, in partial remission ([place])  F33.41     2. Generalized anxiety disorder  F41.1     3. Insomnia, unspecified type  G47.00      Receiving Psychotherapy: Yes    with Rinaldo Cloud, LCSW  RECOMMENDATIONS:  PDMP reviewed.  Xanax filled 04/27/2022. I provided 20 minutes of face to face time during this encounter, including time spent before and after the visit in records review, medical decision making, counseling pertinent to today's visit, and charting.   He is doing well with his mental health so no changes will be made. Continue good sleep hygiene.  Continue Xanax 0.5 mg, 1 p.o. twice daily as needed. Continue Cymbalta 30 mg, 1 p.o. daily. Continue trazodone 100 mg, 1-2 nightly as needed sleep. Continue vitamins as per med list. Continue therapy with Rinaldo Cloud, LCSW. Return in 6 months.  Donnal Moat, PA-C

## 2022-11-20 ENCOUNTER — Encounter: Payer: Self-pay | Admitting: Family Medicine

## 2022-11-20 ENCOUNTER — Other Ambulatory Visit: Payer: Self-pay

## 2022-11-20 ENCOUNTER — Ambulatory Visit (INDEPENDENT_AMBULATORY_CARE_PROVIDER_SITE_OTHER): Payer: Medicare Other | Admitting: Family Medicine

## 2022-11-20 ENCOUNTER — Telehealth: Payer: Self-pay | Admitting: Family Medicine

## 2022-11-20 VITALS — BP 120/70 | HR 80 | Temp 98.0°F | Ht 70.0 in | Wt 290.2 lb

## 2022-11-20 DIAGNOSIS — R351 Nocturia: Secondary | ICD-10-CM

## 2022-11-20 DIAGNOSIS — E119 Type 2 diabetes mellitus without complications: Secondary | ICD-10-CM

## 2022-11-20 DIAGNOSIS — I10 Essential (primary) hypertension: Secondary | ICD-10-CM | POA: Diagnosis not present

## 2022-11-20 DIAGNOSIS — F3342 Major depressive disorder, recurrent, in full remission: Secondary | ICD-10-CM | POA: Diagnosis not present

## 2022-11-20 DIAGNOSIS — I428 Other cardiomyopathies: Secondary | ICD-10-CM | POA: Diagnosis not present

## 2022-11-20 DIAGNOSIS — J4540 Moderate persistent asthma, uncomplicated: Secondary | ICD-10-CM | POA: Diagnosis not present

## 2022-11-20 DIAGNOSIS — Z125 Encounter for screening for malignant neoplasm of prostate: Secondary | ICD-10-CM | POA: Diagnosis not present

## 2022-11-20 DIAGNOSIS — E785 Hyperlipidemia, unspecified: Secondary | ICD-10-CM | POA: Diagnosis not present

## 2022-11-20 DIAGNOSIS — M549 Dorsalgia, unspecified: Secondary | ICD-10-CM

## 2022-11-20 DIAGNOSIS — E1169 Type 2 diabetes mellitus with other specified complication: Secondary | ICD-10-CM

## 2022-11-20 DIAGNOSIS — F411 Generalized anxiety disorder: Secondary | ICD-10-CM

## 2022-11-20 LAB — MICROALBUMIN / CREATININE URINE RATIO
Creatinine,U: 46.2 mg/dL
Microalb Creat Ratio: 4.4 mg/g (ref 0.0–30.0)
Microalb, Ur: 2 mg/dL — ABNORMAL HIGH (ref 0.0–1.9)

## 2022-11-20 LAB — CBC WITH DIFFERENTIAL/PLATELET
Basophils Absolute: 0.1 10*3/uL (ref 0.0–0.1)
Basophils Relative: 0.9 % (ref 0.0–3.0)
Eosinophils Absolute: 0.2 10*3/uL (ref 0.0–0.7)
Eosinophils Relative: 3.5 % (ref 0.0–5.0)
HCT: 40.8 % (ref 39.0–52.0)
Hemoglobin: 13.6 g/dL (ref 13.0–17.0)
Lymphocytes Relative: 20.3 % (ref 12.0–46.0)
Lymphs Abs: 1.3 10*3/uL (ref 0.7–4.0)
MCHC: 33.4 g/dL (ref 30.0–36.0)
MCV: 90.7 fl (ref 78.0–100.0)
Monocytes Absolute: 0.6 10*3/uL (ref 0.1–1.0)
Monocytes Relative: 9.2 % (ref 3.0–12.0)
Neutro Abs: 4.3 10*3/uL (ref 1.4–7.7)
Neutrophils Relative %: 66.1 % (ref 43.0–77.0)
Platelets: 172 10*3/uL (ref 150.0–400.0)
RBC: 4.5 Mil/uL (ref 4.22–5.81)
RDW: 14.9 % (ref 11.5–15.5)
WBC: 6.5 10*3/uL (ref 4.0–10.5)

## 2022-11-20 LAB — COMPREHENSIVE METABOLIC PANEL
ALT: 20 U/L (ref 0–53)
AST: 19 U/L (ref 0–37)
Albumin: 4.2 g/dL (ref 3.5–5.2)
Alkaline Phosphatase: 64 U/L (ref 39–117)
BUN: 30 mg/dL — ABNORMAL HIGH (ref 6–23)
CO2: 25 mEq/L (ref 19–32)
Calcium: 9.4 mg/dL (ref 8.4–10.5)
Chloride: 102 mEq/L (ref 96–112)
Creatinine, Ser: 1.52 mg/dL — ABNORMAL HIGH (ref 0.40–1.50)
GFR: 49.77 mL/min — ABNORMAL LOW (ref >60.00)
Glucose, Bld: 135 mg/dL — ABNORMAL HIGH (ref 70–99)
Potassium: 5 mEq/L (ref 3.5–5.1)
Sodium: 136 mEq/L (ref 135–145)
Total Bilirubin: 0.4 mg/dL (ref 0.2–1.2)
Total Protein: 7.4 g/dL (ref 6.0–8.3)

## 2022-11-20 LAB — PSA: PSA: 2.17 ng/mL (ref 0.10–4.00)

## 2022-11-20 LAB — URINALYSIS, ROUTINE W REFLEX MICROSCOPIC
Bilirubin Urine: NEGATIVE
Hgb urine dipstick: NEGATIVE
Ketones, ur: NEGATIVE
Leukocytes,Ua: NEGATIVE
Nitrite: NEGATIVE
Specific Gravity, Urine: 1.01 (ref 1.000–1.030)
Total Protein, Urine: NEGATIVE
Urine Glucose: NEGATIVE
Urobilinogen, UA: 0.2 (ref 0.0–1.0)
pH: 5.5 (ref 5.0–8.0)

## 2022-11-20 LAB — HEMOGLOBIN A1C: Hgb A1c MFr Bld: 6.7 % — ABNORMAL HIGH (ref 4.6–6.5)

## 2022-11-20 MED ORDER — FLUTICASONE FUROATE-VILANTEROL 200-25 MCG/ACT IN AEPB: 1.0000 | INHALATION_SPRAY | Freq: Every day | RESPIRATORY_TRACT | 3 refills | Status: DC

## 2022-11-20 MED ORDER — TIZANIDINE HCL 2 MG PO TABS: 2.0000 mg | ORAL_TABLET | Freq: Three times a day (TID) | ORAL | 0 refills | Status: DC | PRN
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
## 2022-11-20 NOTE — Patient Instructions (Addendum)
Get diabetic eye exam schedule.  Consider prevnar 20- can get at pharmacy or here next visit  with regular use albuterol daily on breo 100-25 we opted to increase dose to 200-25 to see if this helps  Patient with severe low back pain that has now come down to more moderate range over the last 2 weeks-still has significant spasm in the low back and we will try tizanidine.  Cautious with Flexeril as could increase risk of serotonin syndrome with his other medications. - Offered physical therapy but he would like to give the medication a trial for about 2 weeks and follow-up if not improving - We discussed if pain persist beyond 6 weeks and fails physical therapy or has new or worsening symptoms we may need to consider imaging of the low back  Please stop by lab before you go If you have mychart- we will send your results within 3 business days of [place] receiving them.  If you do not have mychart- we will call you about results within 5 business days of [place] receiving them.  *please also note that you will see labs on mychart as soon as they post. I will later go in and write notes on them- will say "notes from Dr. Yong Channel"   Recommended follow up: Return in about 6 months (around 05/23/2023) for followup or sooner if needed.Schedule b4 you leave.

## 2022-11-20 NOTE — Telephone Encounter (Signed)
Pts wife would like [facility] to call her back. She states she spoke with Dr Yong Channel about some referrals and also has some questions. Please advise.

## 2022-11-20 NOTE — Telephone Encounter (Signed)
Returned pt wife call and she would like to get Physical Therapy  scheduled here for pt, please call to schedule.

## 2022-11-23 ENCOUNTER — Ambulatory Visit (INDEPENDENT_AMBULATORY_CARE_PROVIDER_SITE_OTHER): Payer: Medicare Other

## 2022-11-23 VITALS — Wt 290.0 lb

## 2022-11-23 DIAGNOSIS — Z0000 Encounter for general adult medical examination without abnormal findings: Secondary | ICD-10-CM
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
## 2022-11-23 NOTE — Patient Instructions (Addendum)
[name] , Thank you for taking time to come for your Medicare Wellness Visit. I appreciate your ongoing commitment to your health goals. Please review the following plan we discussed and let me know if I can assist you in the future.   These are the goals we discussed:  Goals      Patient Stated     Lose weight      Patient Stated     Lose weight         This is a list of the screening recommended for you and due dates:  Health Maintenance  Topic Date Due   Eye exam for diabetics  01/03/2022   Complete foot exam   09/29/2022   Zoster (Shingles) Vaccine (1 of 2) 02/20/2023*   Hemoglobin A1C  05/23/2023   Yearly kidney function blood test for diabetes  11/20/2023   Yearly kidney health urinalysis for diabetes  11/20/2023   Medicare Annual Wellness Visit  11/23/2023   DTaP/Tdap/Td vaccine (3 - Td or Tdap) 07/20/2027   Colon Cancer Screening  10/24/2031   Flu Shot  Completed   Hepatitis C Screening: USPSTF Recommendation to screen - Ages 18-79 yo.  Completed   HIV Screening  Completed   Pneumococcal Vaccination  Aged Out   HPV Vaccine  Aged Out   COVID-19 Vaccine  Discontinued  *Topic was postponed. The date shown is not the original due date.    Advanced directives: Advance directive discussed with you today. Even though you declined this today please call our office should you change your mind and we can give you the proper paperwork for you to fill out.   Conditions/risks identified: lose weight   Next appointment: Follow up in one year for your annual wellness visit   Preventive Care 40-64 Years, Male Preventive care refers to lifestyle choices and visits with your health care provider that can promote health and wellness. What does preventive care include? A yearly physical exam. This is also called an annual well check. Dental exams once or twice a year. Routine eye exams. Ask your health care provider how often you should have your eyes checked. Personal  lifestyle choices, including: Daily care of your teeth and gums. Regular physical activity. Eating a healthy diet. Avoiding tobacco and drug use. Limiting alcohol use. Practicing safe sex. Taking low-dose aspirin every day starting at age 30. What happens during an annual well check? The services and screenings done by your health care provider during your annual well check will depend on your age, overall health, lifestyle risk factors, and family history of disease. Counseling  Your health care provider may ask you questions about your: Alcohol use. Tobacco use. Drug use. Emotional well-being. Home and relationship well-being. Sexual activity. Eating habits. Work and work Statistician. Screening  You may have the following tests or measurements: Height, weight, and BMI. Blood pressure. Lipid and cholesterol levels. These may be checked every 5 years, or more frequently if you are over 42 years old. Skin check. Lung cancer screening. You may have this screening every year starting at age 62 if you have a 30-pack-year history of smoking and currently smoke or have quit within the past 15 years. Fecal occult blood test (FOBT) of the stool. You may have this test every year starting at age 64. Flexible sigmoidoscopy or colonoscopy. You may have a sigmoidoscopy every 5 years or a colonoscopy every 10 years starting at age 62. Prostate cancer screening. Recommendations will vary depending on your family  history and other risks. Hepatitis C blood test. Hepatitis B blood test. Sexually transmitted disease (STD) testing. Diabetes screening. This is done by checking your blood sugar (glucose) after you have not eaten for a while (fasting). You may have this done every 1-3 years. Discuss your test results, treatment options, and if necessary, the need for more tests with your health care provider. Vaccines  Your health care provider may recommend certain vaccines, such as: Influenza  vaccine. This is recommended every year. Tetanus, diphtheria, and acellular pertussis (Tdap, Td) vaccine. You may need a Td booster every 10 years. Zoster vaccine. You may need this after age 1. Pneumococcal 13-valent conjugate (PCV13) vaccine. You may need this if you have certain conditions and have not been vaccinated. Pneumococcal polysaccharide (PPSV23) vaccine. You may need one or two doses if you smoke cigarettes or if you have certain conditions. Talk to your health care provider about which screenings and vaccines you need and how often you need them. This information is not intended to replace advice given to you by your health care provider. Make sure you discuss any questions you have with your health care provider. Document Released: 09/20/2015 Document Revised: 05/13/2016 Document Reviewed: 06/25/2015 Elsevier Interactive Patient Education  2017 [place] Prevention in the Home Falls can cause injuries. They can happen to people of all ages. There are many things you can do to make your home safe and to help prevent falls. What can I do on the outside of my home? Regularly fix the edges of walkways and driveways and fix any cracks. Remove anything that might make you trip as you walk through a door, such as a raised step or threshold. Trim any bushes or trees on the path to your home. Use bright outdoor lighting. Clear any walking paths of anything that might make someone trip, such as rocks or tools. Regularly check to see if handrails are loose or broken. Make sure that both sides of any steps have handrails. Any raised decks and porches should have guardrails on the edges. Have any leaves, snow, or ice cleared regularly. Use sand or salt on walking paths during winter. Clean up any spills in your garage right away. This includes oil or grease spills. What can I do in the bathroom? Use night lights. Install grab bars by the toilet and in the tub and shower. Do not  use towel bars as grab bars. Use non-skid mats or decals in the tub or shower. If you need to sit down in the shower, use a plastic, non-slip stool. Keep the floor dry. Clean up any water that spills on the floor as soon as it happens. Remove soap buildup in the tub or shower regularly. Attach bath mats securely with double-sided non-slip rug tape. Do not have throw rugs and other things on the floor that can make you trip. What can I do in the bedroom? Use night lights. Make sure that you have a light by your bed that is easy to reach. Do not use any sheets or blankets that are too big for your bed. They should not hang down onto the floor. Have a firm chair that has side arms. You can use this for support while you get dressed. Do not have throw rugs and other things on the floor that can make you trip. What can I do in the kitchen? Clean up any spills right away. Avoid walking on wet floors. Keep items that you use a lot in  easy-to-reach places. If you need to reach something above you, use a strong step stool that has a grab bar. Keep electrical cords out of the way. Do not use floor polish or wax that makes floors slippery. If you must use wax, use non-skid floor wax. Do not have throw rugs and other things on the floor that can make you trip. What can I do with my stairs? Do not leave any items on the stairs. Make sure that there are handrails on both sides of the stairs and use them. Fix handrails that are broken or loose. Make sure that handrails are as long as the stairways. Check any carpeting to make sure that it is firmly attached to the stairs. Fix any carpet that is loose or worn. Avoid having throw rugs at the top or bottom of the stairs. If you do have throw rugs, attach them to the floor with carpet tape. Make sure that you have a light switch at the top of the stairs and the bottom of the stairs. If you do not have them, ask someone to add them for you. What else can I do  to help prevent falls? Wear shoes that: Do not have high heels. Have rubber bottoms. Are comfortable and fit you well. Are closed at the toe. Do not wear sandals. If you use a stepladder: Make sure that it is fully opened. Do not climb a closed stepladder. Make sure that both sides of the stepladder are locked into place. Ask someone to hold it for you, if possible. Clearly mark and make sure that you can see: Any grab bars or handrails. First and last steps. Where the edge of each step is. Use tools that help you move around (mobility aids) if they are needed. These include: Canes. Walkers. Scooters. Crutches. Turn on the lights when you go into a dark area. Replace any light bulbs as soon as they burn out. Set up your furniture so you have a clear path. Avoid moving your furniture around. If any of your floors are uneven, fix them. If there are any pets around you, be aware of where they are. Review your medicines with your doctor. Some medicines can make you feel dizzy. This can increase your chance of falling. Ask your doctor what other things that you can do to help prevent falls. This information is not intended to replace advice given to you by your health care provider. Make sure you discuss any questions you have with your health care provider. Document Released: 06/20/2009 Document Revised: 01/30/2016 Document Reviewed: 09/28/2014 Elsevier Interactive Patient Education  2017 [employer].

## 2022-12-01 ENCOUNTER — Ambulatory Visit (HOSPITAL_COMMUNITY): Payer: Medicare Other | Attending: Cardiology

## 2022-12-01 DIAGNOSIS — I428 Other cardiomyopathies: Secondary | ICD-10-CM

## 2022-12-01 DIAGNOSIS — I420 Dilated cardiomyopathy: Secondary | ICD-10-CM | POA: Insufficient documentation

## 2022-12-02 LAB — ECHOCARDIOGRAM COMPLETE
Area-P 1/2: 3.03 cm2
S' Lateral: 4 cm
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
## 2022-12-03 ENCOUNTER — Telehealth: Payer: Self-pay | Admitting: Cardiology

## 2022-12-03 ENCOUNTER — Ambulatory Visit (INDEPENDENT_AMBULATORY_CARE_PROVIDER_SITE_OTHER): Payer: Medicare Other | Admitting: Physical Therapy

## 2022-12-03 ENCOUNTER — Encounter: Payer: Self-pay | Admitting: Physical Therapy

## 2022-12-03 DIAGNOSIS — M6281 Muscle weakness (generalized): Secondary | ICD-10-CM | POA: Diagnosis not present

## 2022-12-03 DIAGNOSIS — M5459 Other low back pain: Secondary | ICD-10-CM

## 2022-12-03 NOTE — Telephone Encounter (Signed)
Pt c/o medication issue:  1. Name of Medication:   ENTRESTO 97-103 MG    2. How are you currently taking this medication (dosage and times per day)?    3. Are you having a reaction (difficulty breathing--STAT)? no  4. What is your medication issue? Calling to see if our office have any samples. Please advise

## 2022-12-03 NOTE — Telephone Encounter (Signed)
Patient is aware we do not have samples of entresto.

## 2022-12-07 ENCOUNTER — Ambulatory Visit (INDEPENDENT_AMBULATORY_CARE_PROVIDER_SITE_OTHER): Payer: Medicare Other | Admitting: Podiatry

## 2022-12-07 ENCOUNTER — Encounter: Payer: Self-pay | Admitting: Podiatry

## 2022-12-07 DIAGNOSIS — B351 Tinea unguium: Secondary | ICD-10-CM | POA: Diagnosis not present

## 2022-12-07 DIAGNOSIS — M79676 Pain in unspecified toe(s): Secondary | ICD-10-CM | POA: Diagnosis not present

## 2022-12-07 NOTE — Progress Notes (Signed)
Subjective:   Patient ID: [name], male   DOB: 62 y.o.   MRN: [id number]   HPI Patient has thickened nails 1-5 both feet that he cannot take care of of and states that trimming did help he does have chronic ingrown toenail left big toe   ROS      Objective:  Physical Exam  Neurovascular status intact with thick yellow brittle nailbeds 1-5 both feet with left hallux lateral border found to be moderately incurvated all nails sore     Assessment:  Poor health individual chronic mycotic nail infection 1-5 both feet with pain and ingrown toenail left big toe     Plan:  Debridement of nailbeds 1-5 both feet no angiogenic bleeding reappoint routine care
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
## 2022-12-09 ENCOUNTER — Encounter: Payer: Medicare Other | Admitting: Physical Therapy
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
## 2022-12-15 ENCOUNTER — Ambulatory Visit (INDEPENDENT_AMBULATORY_CARE_PROVIDER_SITE_OTHER): Payer: Medicare Other | Admitting: Physical Therapy

## 2022-12-15 ENCOUNTER — Encounter: Payer: Self-pay | Admitting: Physical Therapy

## 2022-12-15 DIAGNOSIS — M5459 Other low back pain: Secondary | ICD-10-CM

## 2022-12-15 DIAGNOSIS — M6281 Muscle weakness (generalized): Secondary | ICD-10-CM | POA: Diagnosis not present

## 2022-12-17 ENCOUNTER — Encounter: Payer: Medicare Other | Admitting: Physical Therapy

## 2022-12-22 ENCOUNTER — Encounter: Payer: Self-pay | Admitting: Physical Therapy

## 2022-12-22 ENCOUNTER — Ambulatory Visit (INDEPENDENT_AMBULATORY_CARE_PROVIDER_SITE_OTHER): Payer: Medicare Other | Admitting: Physical Therapy

## 2022-12-22 DIAGNOSIS — M5459 Other low back pain: Secondary | ICD-10-CM

## 2022-12-22 DIAGNOSIS — M6281 Muscle weakness (generalized): Secondary | ICD-10-CM | POA: Diagnosis not present
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
## 2022-12-23 ENCOUNTER — Ambulatory Visit: Payer: Medicare Other | Admitting: Psychiatry

## 2022-12-24 ENCOUNTER — Encounter: Payer: Medicare Other | Admitting: Physical Therapy

## 2022-12-28 ENCOUNTER — Ambulatory Visit: Payer: Medicare Other | Admitting: Podiatry

## 2022-12-29 ENCOUNTER — Encounter: Payer: Medicare Other | Admitting: Physical Therapy

## 2023-01-05 ENCOUNTER — Encounter: Payer: Medicare Other | Admitting: Physical Therapy

## 2023-01-06 ENCOUNTER — Ambulatory Visit: Payer: Medicare Other | Admitting: Psychiatry

## 2023-01-07 ENCOUNTER — Encounter: Payer: Medicare Other | Admitting: Physical Therapy

## 2023-01-24 ENCOUNTER — Other Ambulatory Visit: Payer: Self-pay | Admitting: Family Medicine

## 2023-02-08 ENCOUNTER — Other Ambulatory Visit: Payer: Self-pay | Admitting: Internal Medicine

## 2023-02-17 ENCOUNTER — Other Ambulatory Visit: Payer: Self-pay

## 2023-02-17 MED ORDER — FLUTICASONE FUROATE-VILANTEROL 200-25 MCG/ACT IN AEPB: 1.0000 | INHALATION_SPRAY | Freq: Every day | RESPIRATORY_TRACT | 3 refills | Status: AC

## 2023-03-02 ENCOUNTER — Other Ambulatory Visit: Payer: Self-pay | Admitting: Family Medicine

## 2023-03-09 ENCOUNTER — Encounter: Payer: Self-pay | Admitting: Podiatry

## 2023-03-09 ENCOUNTER — Ambulatory Visit (INDEPENDENT_AMBULATORY_CARE_PROVIDER_SITE_OTHER): Payer: Medicare Other | Admitting: Podiatry

## 2023-03-09 DIAGNOSIS — B351 Tinea unguium: Secondary | ICD-10-CM

## 2023-03-09 DIAGNOSIS — M79676 Pain in unspecified toe(s): Secondary | ICD-10-CM

## 2023-03-09 DIAGNOSIS — L600 Ingrowing nail: Secondary | ICD-10-CM | POA: Diagnosis not present

## 2023-03-09 NOTE — Progress Notes (Signed)
Subjective:   Patient ID: [name], male   DOB: 62 y.o.   MRN: [id number]   HPI Patient presents with painful ingrown toenail of the left big toe lateral border and also nail disease 1-5 both feet that he cannot take care of.  States that its become very tender in the corner and he feels like it ingrown   ROS      Objective:  Physical Exam  Neurovascular status intact with patient found to have incurvated lateral border left hallux with distal irritation of the tissue and pain with pressure with slight redness noted and nail disease 1-5 both feet with thickness and incurvation with discomfort     Assessment:  Ingrown toenail deformity left hallux lateral border with pain along with mycotic nail infection 1-5 both feet     Plan:  H&P reviewed and I went ahead today and I did anesthetize the left big toe sterile prep done using sterile instrumentation I remove the lateral border did not do a permanent procedure and applied sterile dressing.  I then debrided nailbeds 1-5 both feet nitrogen including

## 2023-04-05 ENCOUNTER — Ambulatory Visit: Payer: Medicare Other | Admitting: Podiatry

## 2023-04-22 ENCOUNTER — Encounter (INDEPENDENT_AMBULATORY_CARE_PROVIDER_SITE_OTHER): Payer: Self-pay

## 2023-05-07 ENCOUNTER — Other Ambulatory Visit: Payer: Self-pay | Admitting: Internal Medicine

## 2023-05-13 ENCOUNTER — Telehealth: Payer: Medicare Other | Admitting: Physician Assistant

## 2023-05-13 DIAGNOSIS — Z91199 Patient's noncompliance with other medical treatment and regimen due to unspecified reason: Secondary | ICD-10-CM

## 2023-05-13 NOTE — Progress Notes (Signed)
He forgot about appt and unable to do it today. He'll call the office and r/s

## 2023-05-25 ENCOUNTER — Ambulatory Visit (INDEPENDENT_AMBULATORY_CARE_PROVIDER_SITE_OTHER): Payer: Medicare Other | Admitting: Family Medicine

## 2023-05-25 ENCOUNTER — Encounter: Payer: Self-pay | Admitting: Family Medicine

## 2023-05-25 VITALS — BP 112/72 | HR 71 | Temp 97.7°F | Ht 70.0 in | Wt 290.2 lb

## 2023-05-25 DIAGNOSIS — E119 Type 2 diabetes mellitus without complications: Secondary | ICD-10-CM

## 2023-05-25 DIAGNOSIS — Z7984 Long term (current) use of oral hypoglycemic drugs: Secondary | ICD-10-CM | POA: Diagnosis not present

## 2023-05-25 DIAGNOSIS — Z7985 Long-term (current) use of injectable non-insulin antidiabetic drugs: Secondary | ICD-10-CM | POA: Diagnosis not present

## 2023-05-25 DIAGNOSIS — I10 Essential (primary) hypertension: Secondary | ICD-10-CM | POA: Diagnosis not present

## 2023-05-25 DIAGNOSIS — E785 Hyperlipidemia, unspecified: Secondary | ICD-10-CM | POA: Diagnosis not present

## 2023-05-25 DIAGNOSIS — F3342 Major depressive disorder, recurrent, in full remission: Secondary | ICD-10-CM

## 2023-05-25 DIAGNOSIS — E1169 Type 2 diabetes mellitus with other specified complication: Secondary | ICD-10-CM

## 2023-05-25 LAB — COMPREHENSIVE METABOLIC PANEL
ALT: 16 U/L (ref 0–53)
AST: 17 U/L (ref 0–37)
Albumin: 4 g/dL (ref 3.5–5.2)
Alkaline Phosphatase: 54 U/L (ref 39–117)
BUN: 20 mg/dL (ref 6–23)
CO2: 24 meq/L (ref 19–32)
Calcium: 9.1 mg/dL (ref 8.4–10.5)
Chloride: 105 meq/L (ref 96–112)
Creatinine, Ser: 1.39 mg/dL (ref 0.40–1.50)
GFR: 55.2 mL/min — ABNORMAL LOW (ref >60.00)
Glucose, Bld: 105 mg/dL — ABNORMAL HIGH (ref 70–99)
Potassium: 4.2 meq/L (ref 3.5–5.1)
Sodium: 137 meq/L (ref 135–145)
Total Bilirubin: 0.4 mg/dL (ref 0.2–1.2)
Total Protein: 7.1 g/dL (ref 6.0–8.3)

## 2023-05-25 LAB — LIPID PANEL
Cholesterol: 99 mg/dL (ref 0–200)
HDL: 56.2 mg/dL (ref >39.00)
LDL Cholesterol: 31 mg/dL (ref 0–99)
NonHDL: 43.2
Total CHOL/HDL Ratio: 2
Triglycerides: 62 mg/dL (ref 0.0–149.0)
VLDL: 12.4 mg/dL (ref 0.0–40.0)

## 2023-05-25 LAB — HEMOGLOBIN A1C: Hgb A1c MFr Bld: 6.3 % (ref 4.6–6.5)

## 2023-05-25 MED ORDER — TIZANIDINE HCL 2 MG PO TABS: 2.0000 mg | ORAL_TABLET | Freq: Three times a day (TID) | ORAL | 1 refills | Status: DC | PRN

## 2023-05-25 NOTE — Addendum Note (Signed)
Addended by: [name] on: 05/25/2023 04:59 PM   Modules accepted: Level of Service

## 2023-05-25 NOTE — Progress Notes (Signed)
Phone [phone] In person visit   Subjective:   [name] is a 62 y.o. year old very pleasant male patient who presents for/with See problem oriented charting Chief Complaint  Patient presents with   Medical Management of Chronic Issues   Diabetes    DEE not scheduled.   Hypertension   Fall    Pt states he had a fall about 2 weeks ago down the steps and has bad bruise.    Past Medical History-  Patient Active Problem List   Diagnosis Date Noted   Diabetes mellitus without complication (HCC) 11/19/2020    Priority: High   NICM (nonischemic cardiomyopathy) (HCC)     Priority: High   Hyperlipidemia associated with type 2 diabetes mellitus (HCC) 05/20/2022    Priority: Medium    Osteoarthritis, knee 11/19/2020    Priority: Medium    Asthma 11/19/2020    Priority: Medium    GAD (generalized anxiety disorder) 11/19/2020    Priority: Medium    GERD (gastroesophageal reflux disease) 11/19/2020    Priority: Medium    Major depressive disorder, recurrent episode, moderate (HCC) 06/16/2018    Priority: Medium    Normal coronary arteries 08/10/2017    Priority: Medium    Sleep apnea 08/10/2017    Priority: Medium    Essential hypertension 11/13/2014    Priority: Medium    Allergic rhinitis 11/19/2020    Priority: Low   Morbid obesity (HCC) 08/10/2017    Priority: Low   Anorectal abscess,left 07/27/2011    Priority: Low    Medications- reviewed and updated Current Outpatient Medications  Medication Sig Dispense Refill   acetaminophen (TYLENOL) 325 MG tablet Take 2 tablets (650 mg total) by mouth every 6 (six) hours as needed for mild pain (or Fever >/= 101).     albuterol (VENTOLIN HFA) 108 (90 Base) MCG/ACT inhaler INHALE 2 PUFFS BY MOUTH EVERY 4 HOURS AS NEEDED FOR WHEEZE OR FOR SHORTNESS OF BREATH 8.5 each 3   ALPRAZolam (XANAX) 0.5 MG tablet Take 1 tablet (0.5 mg total) by mouth 2 (two) times daily as needed for anxiety. 30 tablet 5   aspirin 81 MG chewable  tablet Chew 1 tablet (81 mg total) by mouth daily.     Blood Glucose Monitoring Suppl (FREESTYLE LITE) w/Device KIT Use to test blood sugars daily. Dx: E11.9 1 kit 3   carvedilol (COREG) 12.5 MG tablet TAKE 1 TABLET BY MOUTH TWICE A DAY 180 tablet 0   Dulaglutide (TRULICITY) 1.5 MG/0.5ML SOPN Inject 1.5 mg into the skin once a week. 5 mL 5   DULoxetine (CYMBALTA) 30 MG capsule Take 1 capsule (30 mg total) by mouth daily. 90 capsule 1   empagliflozin (JARDIANCE) 25 MG TABS tablet Take 1 tablet (25 mg total) by mouth daily before breakfast. 30 tablet 5   ENTRESTO 97-103 MG TAKE 1 TABLET BY MOUTH TWICE A DAY 60 tablet 11   fluticasone furoate-vilanterol (BREO ELLIPTA) 200-25 MCG/ACT AEPB Inhale 1 puff into the lungs daily. 90 each 3   furosemide (LASIX) 40 MG tablet Take 40 mg by mouth daily.     glucose blood (FREESTYLE LITE) test strip USE 1 STRIP TO CHECK GLUCOSE ONCE DAILY. DX: E11.9 100 strip 3   Lancets (FREESTYLE) lancets USE 1  TO CHECK GLUCOSE ONCE DAILY. Dx: E11.9 100 each 0   Multiple Vitamin (MULTIVITAMIN) tablet Take 1 tablet by mouth daily.     NON FORMULARY 1 each by Other route See admin instructions. Use CPAP  machine nightly.     omeprazole (PRILOSEC) 40 MG capsule TAKE 1 CAPSULE (40 MG TOTAL) BY MOUTH DAILY. OFFICE VISIT FOR FURTHER REFILLS 90 capsule 0   rosuvastatin (CRESTOR) 20 MG tablet TAKE 1 TABLET BY MOUTH EVERY DAY 90 tablet 3   sacubitril-valsartan (ENTRESTO) 49-51 MG Take 2 tablets by mouth 2 (two) times daily. 56 tablet 0   spironolactone (ALDACTONE) 25 MG tablet TAKE 1/2 TABLET BY MOUTH DAILY 45 tablet 0   tiZANidine (ZANAFLEX) 2 MG tablet Take 1 tablet (2 mg total) by mouth every 8 (eight) hours as needed for muscle spasms (in low back. do not drive for 8 hours after use). 30 tablet 1   traZODone (DESYREL) 100 MG tablet Take 1-2 tablets (100-200 mg total) by mouth at bedtime as needed. 180 tablet 1   No current facility-administered medications for this visit.      Objective:  BP 112/72   Pulse 71   Temp 97.7 F (36.5 C)   Ht 5\' 10"  (1.778 m)   Wt 290 lb 3.2 oz (131.6 kg)   SpO2 98%   BMI 41.64 kg/m  Gen: NAD, resting comfortably CV: RRR no murmurs rubs or gallops Lungs: CTAB no crackles, wheeze, rhonchi Ext: no edema Skin: warm, dry     Assessment and Plan   # Fall S: Patient reports fall about 2 weeks ago and bruised left hip- also flared up his back pain. Tizanidine helping with back pain and spasms  A/P: he reports area improving but slowly- offered x-ray or physical therapy but he wants to give it another week- will reach out if fails to improve.   -needs refill   # Diabetes S: Medication:Trulicity 1.5 mg and Jardiance 25 mg CBGs- sporadic checks- has been good when checked around 120 Exercise and diet- maintained weight from last visit- discussed would love some mild weight loss Lab Results  Component Value Date   HGBA1C 6.7 (H) 11/20/2022   HGBA1C 5.9 05/20/2022   HGBA1C 6.9 (H) 09/29/2021   A/P: diabetes hopefully stable- update a1c today. Continue current meds for now  - encouraged diabetes eye exam   #Nonischemic cardiomyopathy #hypertension S: medication: Carvedilol 6.25 mg twice daily, Entresto 97-103 mg, spironolactone 12.5 mg daily -  Lasix 40 mg as needed only -no increased edema. No shortness of breath  BP Readings from Last 3 Encounters:  05/25/23 112/72  11/20/22 120/70  10/30/22 110/78  A/P: hypertension and cardiomyopathy stable- continue current medicines   #hyperlipidemia S: Medication:Rosuvastatin 20 mg, also on aspirin for primary prevention  Lab Results  Component Value Date   CHOL 118 05/20/2022   HDL 54.30 05/20/2022   LDLCALC 52 05/20/2022   LDLDIRECT 66.0 04/18/2021   TRIG 56.0 05/20/2022   CHOLHDL 2 05/20/2022   A/P: hopefully stable- update lipid panel today. Continue current meds for now   # Asthma S: Maintenance Medication: Breo 100-25 1 puff daily--> 200-25 mg in March 2024 As  needed medication: Albuterol. Patient is using this 2x a month  A/P: doing much better- continue current medications    #Depression/generalized anxiety disorder-managed by [facility] S: Medication: Managed by psychiatry with Cymbalta 30 mg BID, alprazolam 0.5 mg twice daily as needed, trazodone 100 to 200 mg before bed for insomnia as well as buspirone A/P: her reports good control- continue current medications    # GERD- omeprazole 40 mg daily- did not tolerate 20 mg  #OSA managed by [facility]-continue CPAP with humidifier  - congested each morning with  CPAP. Years ago tried claritin- he may retrial claritin before bed to see if helps morning congestion   Recommended follow up: Return in about 6 months (around 11/22/2023) for followup or sooner if needed.Schedule b4 you leave. Future Appointments  Date Time Provider Department Center  06/16/2023  8:45 AM Lenn Sink, DPM [facility]  11/30/2023 11:15 AM [facility] ANNUAL WELLNESS VISIT 1 [facility]    Lab/Order associations: FASTING   ICD-10-CM   1. Diabetes mellitus without complication (HCC)  E11.9 Hemoglobin A1c    Comprehensive metabolic panel    Lipid panel    2. Essential hypertension  I10     3. Hyperlipidemia associated with type 2 diabetes mellitus (HCC)  E11.69    E78.5     4. Major depressive disorder, recurrent episode, in full remission (HCC)  F33.42       Meds ordered this encounter  Medications   tiZANidine (ZANAFLEX) 2 MG tablet    Sig: Take 1 tablet (2 mg total) by mouth every 8 (eight) hours as needed for muscle spasms (in low back. do not drive for 8 hours after use).    Dispense:  30 tablet    Refill:  1    Return precautions advised.  Tana Conch, MD

## 2023-05-25 NOTE — Patient Instructions (Addendum)
Get diabetic eye exam scheduled.  he may retrial claritin before bed to see if helps morning congestion   Let me know if you change your mind on physical therapy or x-rays of either hip or back by next Monday ideally  I suggest myfitnesspal Use 0.5 pounds per week weight loss goal (or up to 1 pounds if needed) Set a reasonable goal such as 5-10 lbs and can reset goal once you reach the goal Do not connect your step counter to this- watch or phone Update me in 2-3 months with how you are doing  Please stop by lab before you go If you have mychart- we will send your results within 3 business days of [place] receiving them.  If you do not have mychart- we will call you about results within 5 business days of [place] receiving them.  *please also note that you will see labs on mychart as soon as they post. I will later go in and write notes on them- will say "notes from Dr. Durene Cal"   Recommended follow up: Return in about 6 months (around 11/22/2023) for followup or sooner if needed.Schedule b4 you leave.

## 2023-06-05 ENCOUNTER — Other Ambulatory Visit: Payer: Self-pay | Admitting: Cardiology

## 2023-06-05 DIAGNOSIS — I509 Heart failure, unspecified: Secondary | ICD-10-CM

## 2023-06-05 DIAGNOSIS — E7800 Pure hypercholesterolemia, unspecified: Secondary | ICD-10-CM

## 2023-06-05 DIAGNOSIS — I420 Dilated cardiomyopathy: Secondary | ICD-10-CM

## 2023-06-05 DIAGNOSIS — I10 Essential (primary) hypertension: Secondary | ICD-10-CM

## 2023-06-16 ENCOUNTER — Ambulatory Visit: Payer: Medicare Other | Admitting: Podiatry

## 2023-06-17 ENCOUNTER — Ambulatory Visit: Payer: Medicare Other | Admitting: Podiatry

## 2023-06-25 ENCOUNTER — Encounter: Payer: Self-pay | Admitting: Podiatry

## 2023-06-25 ENCOUNTER — Ambulatory Visit (INDEPENDENT_AMBULATORY_CARE_PROVIDER_SITE_OTHER): Payer: Medicare Other | Admitting: Podiatry

## 2023-06-25 DIAGNOSIS — E1142 Type 2 diabetes mellitus with diabetic polyneuropathy: Secondary | ICD-10-CM

## 2023-06-25 DIAGNOSIS — E119 Type 2 diabetes mellitus without complications: Secondary | ICD-10-CM

## 2023-06-25 DIAGNOSIS — B351 Tinea unguium: Secondary | ICD-10-CM | POA: Diagnosis not present

## 2023-06-25 DIAGNOSIS — L600 Ingrowing nail: Secondary | ICD-10-CM | POA: Diagnosis not present

## 2023-06-25 DIAGNOSIS — M79676 Pain in unspecified toe(s): Secondary | ICD-10-CM

## 2023-06-25 NOTE — Patient Instructions (Signed)
Ingrown Toenail  An ingrown toenail occurs when the corner or sides of a toenail grow into the surrounding skin. This causes discomfort and pain. The big toe is most commonly affected, but any of the toes can be affected. If an ingrown toenail is not treated, it can become infected. What are the causes? This condition may be caused by: Wearing shoes that are too small or tight. An injury, such as stubbing your toe or having your toe stepped on. Improper cutting or care of your toenails. Having nail or foot abnormalities that were present from birth (congenital abnormalities), such as having a nail that is too big for your toe. What increases the risk? The following factors may make you more likely to develop ingrown toenails: Age. Nails tend to get thicker with age, so ingrown nails are more common among older people. Cutting your toenails incorrectly, such as cutting them very short or cutting them unevenly. An ingrown toenail is more likely to get infected if you have: Diabetes. Blood flow (circulation) problems. What are the signs or symptoms? Symptoms of an ingrown toenail may include: Pain, soreness, or tenderness. Redness. Swelling. Hardening of the skin that surrounds the toenail. Signs that an ingrown toenail may be infected include: Fluid or pus. Symptoms that get worse. How is this diagnosed? Ingrown toenails may be diagnosed based on: Your symptoms and medical history. A physical exam. Labs or tests. If you have fluid or blood coming from your toenail, a sample may be collected to test for the specific type of bacteria that is causing the infection. How is this treated? Treatment depends on the severity of your symptoms. You may be able to care for your toenail at home. If you have an infection, you may be prescribed antibiotic medicines. If you have fluid or pus draining from your toenail, your health care provider may drain it. If you have trouble walking, you may be  given crutches to use. If you have a severe or infected ingrown toenail, you may need a procedure to remove part or all of the nail. Follow these instructions at home: Foot care  Check your wound every day for signs of infection, or as often as told by your health care provider. Check for: More redness, swelling, or pain. More fluid or blood. Warmth. Pus or a bad smell. Do not pick at your toenail or try to remove it yourself. Soak your foot in warm, soapy water. Do this for 20 minutes, 3 times a day, or as often as told by your health care provider. This helps to keep your toe clean and your skin soft. Wear shoes that fit well and are not too tight. Your health care provider may recommend that you wear open-toed shoes while you heal. Trim your toenails regularly and carefully. Cut your toenails straight across to prevent injury to the skin at the corners of the toenail. Do not cut your nails in a curved shape. Keep your feet clean and dry to help prevent infection. General instructions Take over-the-counter and prescription medicines only as told by your health care provider. If you were prescribed an antibiotic, take it as told by your health care provider. Do not stop taking the antibiotic even if you start to feel better. If your health care provider told you to use crutches to help you move around, use them as instructed. Return to your normal activities as told by your health care provider. Ask your health care provider what activities are safe for you.  Keep all follow-up visits. This is important. Contact a health care provider if: You have more redness, swelling, pain, or other symptoms that do not improve with treatment. You have fluid, blood, or pus coming from your toenail. You have a red streak on your skin that starts at your foot and spreads up your leg. You have a fever. Summary An ingrown toenail occurs when the corner or sides of a toenail grow into the surrounding skin.  This causes discomfort and pain. The big toe is most commonly affected, but any of the toes can be affected. If an ingrown toenail is not treated, it can become infected. Fluid or pus draining from your toenail is a sign of infection. Your health care provider may need to drain it. You may be given antibiotics to treat the infection. Trimming your toenails regularly and properly can help you prevent an ingrown toenail. This information is not intended to replace advice given to you by your health care provider. Make sure you discuss any questions you have with your health care provider. Document Revised: 12/24/2020 Document Reviewed: 12/24/2020 Elsevier Patient Education  2024 [employer]   Diabetes Mellitus and Foot Care Diabetes, also called diabetes mellitus, may cause problems with your feet and legs because of poor blood flow (circulation). Poor circulation may make your skin: Become thinner and drier. Break more easily. Heal more slowly. Peel and crack. You may also have nerve damage (neuropathy). This can cause decreased feeling in your legs and feet. This means that you may not notice minor injuries to your feet that could lead to more serious problems. Finding and treating problems early is the best way to prevent future foot problems. How to care for your feet Foot hygiene  Wash your feet daily with warm water and mild soap. Do not use hot water. Then, pat your feet and the areas between your toes until they are fully dry. Do not soak your feet. This can dry your skin. Trim your toenails straight across. Do not dig under them or around the cuticle. File the edges of your nails with an emery board or nail file. Apply a moisturizing lotion or petroleum jelly to the skin on your feet and to dry, brittle toenails. Use lotion that does not contain alcohol and is unscented. Do not apply lotion between your toes. Shoes and socks Wear clean socks or stockings every day. Make sure they are  not too tight. Do not wear knee-high stockings. These may decrease blood flow to your legs. Wear shoes that fit well and have enough cushioning. Always look in your shoes before you put them on to be sure there are no objects inside. To break in new shoes, wear them for just a few hours a day. This prevents injuries on your feet. Wounds, scrapes, corns, and calluses  Check your feet daily for blisters, cuts, bruises, sores, and redness. If you cannot see the bottom of your feet, use a mirror or ask someone for help. Do not cut off corns or calluses or try to remove them with medicine. If you find a minor scrape, cut, or break in the skin on your feet, keep it and the skin around it clean and dry. You may clean these areas with mild soap and water. Do not clean the area with peroxide, alcohol, or iodine. If you have a wound, scrape, corn, or callus on your foot, look at it several times a day to make sure it is healing and not infected.  Check for: Redness, swelling, or pain. Fluid or blood. Warmth. Pus or a bad smell. General tips Do not cross your legs. This may decrease blood flow to your feet. Do not use heating pads or hot water bottles on your feet. They may burn your skin. If you have lost feeling in your feet or legs, you may not know this is happening until it is too late. Protect your feet from hot and cold by wearing shoes, such as at the beach or on hot pavement. Schedule a complete foot exam at least once a year or more often if you have foot problems. Report any cuts, sores, or bruises to your health care provider right away. Where to find more information American Diabetes Association: diabetes.org Association of Diabetes Care & Education Specialists: diabeteseducator.org Contact a health care provider if: You have a condition that increases your risk of infection, and you have any cuts, sores, or bruises on your feet. You have an injury that is not healing. You have redness on  your legs or feet. You feel burning or tingling in your legs or feet. You have pain or cramps in your legs and feet. Your legs or feet are numb. Your feet always feel cold. You have pain around any toenails. Get help right away if: You have a wound, scrape, corn, or callus on your foot and: You have signs of infection. You have a fever. You have a red line going up your leg. This information is not intended to replace advice given to you by your health care provider. Make sure you discuss any questions you have with your health care provider. Document Revised: 02/25/2022 Document Reviewed: 02/25/2022 Elsevier Patient Education  2024 [employer].
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
## 2023-07-06 ENCOUNTER — Ambulatory Visit (INDEPENDENT_AMBULATORY_CARE_PROVIDER_SITE_OTHER): Payer: Medicare Other | Admitting: Podiatry

## 2023-07-06 ENCOUNTER — Encounter: Payer: Self-pay | Admitting: Podiatry

## 2023-07-06 VITALS — BP 123/71 | HR 65

## 2023-07-06 DIAGNOSIS — L603 Nail dystrophy: Secondary | ICD-10-CM

## 2023-07-06 NOTE — Progress Notes (Signed)
Subjective:  Patient ID: [name], male    DOB: [birth year],  MRN: [id number]  Chief Complaint  Patient presents with   Nail Problem    "I have an ingrown toenail on my left big toe.  Dr. Eloy End said I may need to have it permanently removed."    63 y.o. male presents with the above complaint.  Patient presents with left hallux nail dystrophy painful to touch is progressive and worse worse with ambulation or shoe pressure he would like to have it removed and made permanent he has not seen MRIs prior to seeing me denies any other acute complaints he is a diabetic with last A1c of 6.5   Review of Systems: Negative except as noted in the HPI. Denies N/V/F/Ch.  Past Medical History:  Diagnosis Date   Abscess of anal and rectal regions    Acute renal insufficiency    Anxiety    Arthritis    Asthma    Cardiac arrhythmia    Chest pain    CHF (congestive heart failure) (HCC)    COPD (chronic obstructive pulmonary disease) (HCC)    Depression    Diabetes mellitus    "pre diabetic"- no meds   DOE (dyspnea on exertion)    GERD (gastroesophageal reflux disease)    Hemorrhoids, internal    Hyperlipidemia    Hyperplastic colon polyp    Hypertension    IBS (irritable bowel syndrome)    Sleep apnea 2003   BPAP    Current Outpatient Medications:    acetaminophen (TYLENOL) 325 MG tablet, Take 2 tablets (650 mg total) by mouth every 6 (six) hours as needed for mild pain (or Fever >/= 101)., Disp: , Rfl:    albuterol (VENTOLIN HFA) 108 (90 Base) MCG/ACT inhaler, INHALE 2 PUFFS BY MOUTH EVERY 4 HOURS AS NEEDED FOR WHEEZE OR FOR SHORTNESS OF BREATH, Disp: 8.5 each, Rfl: 3   ALPRAZolam (XANAX) 0.5 MG tablet, Take 1 tablet (0.5 mg total) by mouth 2 (two) times daily as needed for anxiety., Disp: 30 tablet, Rfl: 5   aspirin 81 MG chewable tablet, Chew 1 tablet (81 mg total) by mouth daily., Disp: , Rfl:    Blood Glucose Monitoring Suppl (FREESTYLE LITE) w/Device KIT, Use to test blood  sugars daily. Dx: E11.9, Disp: 1 kit, Rfl: 3   carvedilol (COREG) 12.5 MG tablet, TAKE 1 TABLET BY MOUTH TWICE A DAY, Disp: 180 tablet, Rfl: 3   Dulaglutide (TRULICITY) 1.5 MG/0.5ML SOPN, Inject 1.5 mg into the skin once a week., Disp: 5 mL, Rfl: 5   DULoxetine (CYMBALTA) 30 MG capsule, Take 1 capsule (30 mg total) by mouth daily., Disp: 90 capsule, Rfl: 1   empagliflozin (JARDIANCE) 25 MG TABS tablet, Take 1 tablet (25 mg total) by mouth daily before breakfast., Disp: 30 tablet, Rfl: 5   ENTRESTO 97-103 MG, TAKE 1 TABLET BY MOUTH TWICE A DAY, Disp: 60 tablet, Rfl: 11   fluticasone furoate-vilanterol (BREO ELLIPTA) 200-25 MCG/ACT AEPB, Inhale 1 puff into the lungs daily., Disp: 90 each, Rfl: 3   furosemide (LASIX) 40 MG tablet, Take 40 mg by mouth daily., Disp: , Rfl:    glucose blood (FREESTYLE LITE) test strip, USE 1 STRIP TO CHECK GLUCOSE ONCE DAILY. DX: E11.9, Disp: 100 strip, Rfl: 3   Lancets (FREESTYLE) lancets, USE 1  TO CHECK GLUCOSE ONCE DAILY. Dx: E11.9, Disp: 100 each, Rfl: 0   Multiple Vitamin (MULTIVITAMIN) tablet, Take 1 tablet by mouth daily., Disp: , Rfl:  NON FORMULARY, 1 each by Other route See admin instructions. Use CPAP machine nightly., Disp: , Rfl:    omeprazole (PRILOSEC) 40 MG capsule, TAKE 1 CAPSULE (40 MG TOTAL) BY MOUTH DAILY. OFFICE VISIT FOR FURTHER REFILLS, Disp: 90 capsule, Rfl: 0   rosuvastatin (CRESTOR) 20 MG tablet, TAKE 1 TABLET BY MOUTH EVERY DAY, Disp: 90 tablet, Rfl: 3   sacubitril-valsartan (ENTRESTO) 49-51 MG, Take 2 tablets by mouth 2 (two) times daily., Disp: 56 tablet, Rfl: 0   spironolactone (ALDACTONE) 25 MG tablet, TAKE 1/2 TABLET BY MOUTH DAILY, Disp: 45 tablet, Rfl: 3   tiZANidine (ZANAFLEX) 2 MG tablet, Take 1 tablet (2 mg total) by mouth every 8 (eight) hours as needed for muscle spasms (in low back. do not drive for 8 hours after use)., Disp: 30 tablet, Rfl: 1   traZODone (DESYREL) 100 MG tablet, Take 1-2 tablets (100-200 mg total) by mouth at  bedtime as needed., Disp: 180 tablet, Rfl: 1  Social History   Tobacco Use  Smoking Status Former   Current packs/day: 0.00   Average packs/day: 0.5 packs/day for 15.0 years (7.5 ttl pk-yrs)   Types: Cigarettes   Start date: 12/14/1980   Quit date: 12/15/1995   Years since quitting: 27.5  Smokeless Tobacco Never    Allergies  Allergen Reactions   Shellfish Allergy Nausea And Vomiting   Buspar [Buspirone] Other (See Comments)    headache   Objective:   Vitals:   07/06/23 0830  BP: 123/71  Pulse: 65   There is no height or weight on file to calculate BMI. Constitutional Well developed. Well nourished.  Vascular Dorsalis pedis pulses palpable bilaterally. Posterior tibial pulses palpable bilaterally. Capillary refill normal to all digits.  No cyanosis or clubbing noted. Pedal hair growth normal.  Neurologic Normal speech. Oriented to person, place, and time. Epicritic sensation to light touch grossly present bilaterally.  Dermatologic Pain on palpation of the entire/total nail on 1st digit of the left No other open wounds. No skin lesions.  Orthopedic: Normal joint ROM without pain or crepitus bilaterally. No visible deformities. No bony tenderness.   Radiographs: None Assessment:   1. Nail dystrophy    Plan:  Patient was evaluated and treated and all questions answered.  Nail contusion/dystrophy hallux, left -Patient elects to proceed with minor surgery to remove entire toenail today. Consent reviewed and signed by patient. -Entire/total nail excised. See procedure note. -Educated on post-procedure care including soaking. Written instructions provided and reviewed. -Patient to follow up in 2 weeks for nail check.  Procedure: Excision of entire/total nail with phenol matricectomy Location: Left 1st toe digit Anesthesia: Lidocaine 1% plain; 1.5 mL and Marcaine 0.5% plain; 1.5 mL, digital block. Skin Prep: Betadine. Dressing: Silvadene; telfa; dry, sterile,  compression dressing. Technique: Following skin prep, the toe was exsanguinated and a tourniquet was secured at the base of the toe. The affected nail border was freed and excised. The tourniquet was then removed and sterile dressing applied. Disposition: Patient tolerated procedure well. Patient to return in 2 weeks for follow-up.   No follow-ups on file.

## 2023-07-22 ENCOUNTER — Encounter: Payer: Self-pay | Admitting: Podiatry

## 2023-07-22 ENCOUNTER — Ambulatory Visit: Payer: Medicare Other | Admitting: Podiatry

## 2023-07-22 DIAGNOSIS — L603 Nail dystrophy: Secondary | ICD-10-CM

## 2023-07-22 NOTE — Progress Notes (Signed)
Subjective: [name] is a 62 y.o.  male returns to office today for follow up evaluation after having left 1st total nail avulsion performed. Patient has been soaking using epsom salt and applying topical antibiotic covered with bandaid daily. Patient denies fevers, chills, nausea, vomiting. Denies any calf pain, chest pain, SOB.   Objective:  Vitals: Reviewed  General: Well developed, nourished, in no acute distress, alert and oriented x3   Dermatology: Skin is warm, dry and supple bilateral. left 1st nail bed appears to be clean, dry, with mild granular tissue and surrounding scab. There is no surrounding erythema, edema, drainage/purulence. The remaining nails appear unremarkable at this time. There are no other lesions or other signs of infection present.  Neurovascular status: Intact. No lower extremity swelling; No pain with calf compression bilateral.  Musculoskeletal: Decreased tenderness to palpation of the left 1st nail bed. Muscular strength within normal limits bilateral.   Assesement and Plan: S/p total nail avulsion, doing well.   -disContinue soaking in epsom salts twice a day followed by antibiotic ointment and a band-aid. Can leave uncovered at night. Continue this until completely healed.  -If the area has not healed in 2 weeks, call the office for follow-up appointment, or sooner if any problems arise.  -Monitor for any signs/symptoms of infection. Call the office immediately if any occur or go directly to the emergency room. Call with any questions/concerns.  Nicholes Rough, DPM

## 2023-08-12 ENCOUNTER — Other Ambulatory Visit: Payer: Self-pay | Admitting: Internal Medicine

## 2023-09-07 ENCOUNTER — Telehealth: Payer: Self-pay | Admitting: *Deleted

## 2023-09-07 MED ORDER — OMEPRAZOLE 40 MG PO CPDR: 40.0000 mg | DELAYED_RELEASE_CAPSULE | Freq: Every day | ORAL | 0 refills | Status: DC

## 2023-09-07 NOTE — Telephone Encounter (Signed)
 RX sent.    Copied from CRM [id number]. Topic: Clinical - Medication Refill >> Sep 07, 2023  9:09 AM Corean SAUNDERS wrote: Most Recent Primary Care Visit:  Provider: [name]  Department: [facility]  Visit Type: OFFICE VISIT  Date: 05/25/2023  Medication: omeprazole  (PRILOSEC) 40 MG capsule   Has the patient contacted their pharmacy? Yes-PT is out of re-fills (Agent: If no, request that the patient contact the pharmacy for the refill. If patient does not wish to contact the pharmacy document the reason why and proceed with request.) (Agent: If yes, when and what did the pharmacy advise?)  Is this the correct pharmacy for this prescription? Yes If no, delete pharmacy and type the correct one.  This is the patient's preferred pharmacy:  [facility] [phone] [facility] [place] - [address] [place] [address] Phone: [phone] Fax: [phone]   Has the prescription been filled recently? Yes  Is the patient out of the medication? omeprazole  (PRILOSEC) 40 MG capsule   Has the patient been seen for an appointment in the last year OR does the patient have an upcoming appointment? Yes  Can we respond through MyChart? Yes  Agent: Please be advised that Rx refills may take up to 3 business days. We ask that you follow-up with your pharmacy.

## 2023-09-10 NOTE — Telephone Encounter (Signed)
 Called Alinda Money and LM- needs appt if continuing here with Rosey Bath.

## 2023-09-20 ENCOUNTER — Telehealth: Payer: Self-pay | Admitting: Adult Health

## 2023-09-20 NOTE — Telephone Encounter (Signed)
 Pt's wife scheduled appt and wait listed

## 2023-09-30 LAB — HM DIABETES EYE EXAM

## 2023-10-01 ENCOUNTER — Ambulatory Visit (INDEPENDENT_AMBULATORY_CARE_PROVIDER_SITE_OTHER): Payer: Medicare Other | Admitting: Podiatry

## 2023-10-01 DIAGNOSIS — Z91198 Patient's noncompliance with other medical treatment and regimen for other reason: Secondary | ICD-10-CM

## 2023-10-01 NOTE — Progress Notes (Signed)
1. Failure to attend appointment with reason given    Rescheduled by patient.

## 2023-10-07 ENCOUNTER — Other Ambulatory Visit: Payer: Self-pay | Admitting: Cardiology

## 2023-10-07 DIAGNOSIS — I5042 Chronic combined systolic (congestive) and diastolic (congestive) heart failure: Secondary | ICD-10-CM

## 2023-10-14 ENCOUNTER — Ambulatory Visit: Payer: Medicare Other | Admitting: Psychiatry

## 2023-10-18 ENCOUNTER — Ambulatory Visit (INDEPENDENT_AMBULATORY_CARE_PROVIDER_SITE_OTHER): Payer: Medicare Other | Admitting: Podiatry

## 2023-10-18 ENCOUNTER — Encounter: Payer: Self-pay | Admitting: Podiatry

## 2023-10-18 DIAGNOSIS — M79676 Pain in unspecified toe(s): Secondary | ICD-10-CM

## 2023-10-18 DIAGNOSIS — E1142 Type 2 diabetes mellitus with diabetic polyneuropathy: Secondary | ICD-10-CM

## 2023-10-18 DIAGNOSIS — B351 Tinea unguium: Secondary | ICD-10-CM | POA: Diagnosis not present

## 2023-10-19 ENCOUNTER — Ambulatory Visit: Payer: Medicare Other | Admitting: Physician Assistant

## 2023-10-21 ENCOUNTER — Encounter: Payer: Self-pay | Admitting: Neurology

## 2023-10-21 ENCOUNTER — Ambulatory Visit: Payer: Medicare Other | Admitting: Neurology

## 2023-10-21 VITALS — BP 122/68 | HR 84 | Ht 70.0 in | Wt 274.0 lb

## 2023-10-21 DIAGNOSIS — R634 Abnormal weight loss: Secondary | ICD-10-CM

## 2023-10-21 DIAGNOSIS — G4733 Obstructive sleep apnea (adult) (pediatric): Secondary | ICD-10-CM

## 2023-10-21 NOTE — Patient Instructions (Signed)
It was nice to see you both again.  You are fully compliant with your autoPAP. You are eligible for a new machine but we can certainly wait till next year to get you a new machine. You will have to do a home sleep test for re-evaluation at the time.   Please continue using your autoPAP regularly. While your insurance requires that you use PAP at least 4 hours each night on 70% of the nights, I recommend, that you not skip any nights and use it throughout the night if you can. Getting used to PAP and staying with the treatment long term does take time and patience and discipline. Untreated obstructive sleep apnea when it is moderate to severe can have an adverse impact on cardiovascular health and raise her risk for heart disease, arrhythmias, hypertension, congestive heart failure, stroke and diabetes. Untreated obstructive sleep apnea causes sleep disruption, nonrestorative sleep, and sleep deprivation. This can have an impact on your day to day functioning and cause daytime sleepiness and impairment of cognitive function, memory loss, mood disturbance, and problems focussing. Using PAP regularly can improve these symptoms.  We can see you in 1 year, you can see one of our nurse practitioners as you are stable. We can consider a home sleep test for a new machine at the time.
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
## 2023-10-24 NOTE — Progress Notes (Unsigned)
 Cardiology Office Note:  .   Date:  10/25/2023  ID:  [name], DOB 02/28/1961, MRN [id number] PCP: [name], MD  [facility] Providers Cardiologist:  Olga Millers, MD1}   }   History of Present Illness: .   [name] is a 63 y.o. male with history of hypertensive cardiomyopathy, cardiac catheterization November 2018 revealed normal coronary arteries but elevated LV end-diastolic pressure of 26 mmHg.  Most recent echocardiogram was in March 2024 revealing an EF of 50 to 55% with mild left ventricular enlargement and mild left ventricular hypertrophy with grade 1 diastolic dysfunction.  Seen last by Dr. Jens Som on 10/30/2018 for with complaints of ongoing dyspnea on exertion without associated chest pain.  On that office visit Entresto carvedilol and spironolactone were continued, he was to continue Jardiance and Lasix as needed.  Blood pressure was well-controlled.  He was continued on statin therapy.  He comes today feeling very well.  He continues to lose weight as he is dieting.  He is unable to do any strenuous exercise due to bilateral knee pain.  He is not having any issues with significant shortness of breath, dizziness, or chest pain.  He has run out of his Entresto prescriptions.  He states that it cost $800 until he reaches his deductible limit.  He denies any edema PND or orthopnea.  ROS: As above otherwise negative  Studies Reviewed: .   Echocardiogram 12/02/2022 1. Left ventricular ejection fraction, by estimation, is 50 to 55%. The  left ventricle has low normal function. The left ventricle has no regional  wall motion abnormalities. Left ventricular diastolic parameters are  consistent with Grade I diastolic  dysfunction (impaired relaxation).   2. Right ventricular systolic function is normal. The right ventricular  size is normal. There is normal pulmonary artery systolic pressure.   3. The mitral valve is normal in structure. Trivial  mitral valve  regurgitation. No evidence of mitral stenosis.   4. The aortic valve is normal in structure. Aortic valve regurgitation is  not visualized. No aortic stenosis is present.   5. The inferior vena cava is normal in size with greater than 50%  respiratory variability, suggesting right atrial pressure of 3 mmHg.     EKG Interpretation Date/Time:  Monday October 25 2023 15:25:10 EST Ventricular Rate:  72 PR Interval:  178 QRS Duration:  76 QT Interval:  382 QTC Calculation: 418 R Axis:   13  Text Interpretation: Sinus rhythm with Premature atrial complexes with Abberant conduction Low voltage QRS When compared with ECG of 13-Nov-2014 10:17, PREVIOUS ECG IS PRESENT Confirmed by Joni Reining [id number]) on 10/25/2023 3:55:49 PM    Physical Exam:   VS:  BP 112/74 (BP Location: Left Arm, Patient Position: Sitting, Cuff Size: Large)   Pulse 72   Ht 5\' 10"  (1.778 m)   Wt 277 lb (125.6 kg)   SpO2 97%   BMI 39.75 kg/m    Wt Readings from Last 3 Encounters:  10/25/23 277 lb (125.6 kg)  10/21/23 274 lb (124.3 kg)  05/25/23 290 lb 3.2 oz (131.6 kg)    GEN: Well nourished, well developed in no acute distress NECK: No JVD; No carotid bruits CARDIAC: RRR, distant heart sounds no murmurs, rubs, gallops RESPIRATORY:  Clear to auscultation without rales, wheezing or rhonchi, occasional cough ABDOMEN: Soft, non-tender, non-distended EXTREMITIES:  No edema; No deformity   ASSESSMENT AND PLAN: .    Hypertensive cardiomyopathy: Most recent echocardiogram in March 2024  revealed return to normal LVEF.  He will continue on Entresto 97 103 twice daily.  He has run out of prescription refills at lower cost.  He is given a coupon we do not have any samples at that dose.  The patient is to meet his $200 deductible and then can of afford it at $47 a month.  He is given refills on his other medications which will help him to reach this deductible.  This includes furosemide 40 mg daily and  spironolactone 25 mg (1/2 tablet) daily.  He is to continue low-sodium diet, calorie restriction concerning obesity, and hopefully purposeful exercise as he is able.  Will continue annual echocardiograms and 1 will be ordered today.  2.  Hypertension: Excellent control blood pressure today.  He will continue the carvedilol and Entresto as directed.  He has had labs recently by PCP all of which were in normal limits.  No changes in his regimen at this time.  3.  OSA: Remains on CPAP.  Cardiology is not following this.  4.  Type 2 diabetes: He remains on Jardiance and Trulicity.  He does check his blood glucose.  Followed by primary.         Signed, Bettey Mare. Liborio Nixon, ANP, AACC

## 2023-10-25 ENCOUNTER — Encounter: Payer: Self-pay | Admitting: Adult Health

## 2023-10-25 ENCOUNTER — Ambulatory Visit: Payer: Medicare Other | Attending: Adult Health | Admitting: Adult Health

## 2023-10-25 VITALS — BP 112/74 | HR 72 | Ht 70.0 in | Wt 277.0 lb

## 2023-10-25 DIAGNOSIS — I420 Dilated cardiomyopathy: Secondary | ICD-10-CM | POA: Insufficient documentation

## 2023-10-25 DIAGNOSIS — E7800 Pure hypercholesterolemia, unspecified: Secondary | ICD-10-CM | POA: Insufficient documentation

## 2023-10-25 DIAGNOSIS — I10 Essential (primary) hypertension: Secondary | ICD-10-CM | POA: Insufficient documentation

## 2023-10-25 DIAGNOSIS — I509 Heart failure, unspecified: Secondary | ICD-10-CM | POA: Diagnosis not present

## 2023-10-25 MED ORDER — SPIRONOLACTONE 25 MG PO TABS: 12.5000 mg | ORAL_TABLET | Freq: Every day | ORAL | 3 refills | Status: DC

## 2023-10-25 MED ORDER — FUROSEMIDE 40 MG PO TABS: 40.0000 mg | ORAL_TABLET | Freq: Every day | ORAL | 3 refills | Status: DC

## 2023-10-25 MED ORDER — ROSUVASTATIN CALCIUM 20 MG PO TABS
20.0000 mg | ORAL_TABLET | Freq: Every day | ORAL | 3 refills | Status: DC
Start: 2023-10-25 — End: 2024-04-24

## 2023-10-25 MED ORDER — CARVEDILOL 12.5 MG PO TABS: 12.5000 mg | ORAL_TABLET | Freq: Two times a day (BID) | ORAL | 3 refills | Status: DC

## 2023-10-25 NOTE — Patient Instructions (Signed)
 Medication Instructions:  No Changes *If you need a refill on your cardiac medications before your next appointment, please call your pharmacy*   Lab Work: No Labs If you have labs (blood work) drawn today and your tests are completely normal, you will receive your results only by: MyChart Message (if you have MyChart) OR A paper copy in the mail If you have any lab test that is abnormal or we need to change your treatment, we will call you to review the results.   Testing/Procedures: [address]. Your physician has requested that you have an echocardiogram. Echocardiography is a painless test that uses sound waves to create images of your heart. It provides your doctor with information about the size and shape of your heart and how well your heart's chambers and valves are working. This procedure takes approximately one hour. There are no restrictions for this procedure. Please do NOT wear cologne, perfume, aftershave, or lotions (deodorant is allowed). Please arrive 15 minutes prior to your appointment time.  Please note: We ask at that you not bring children with you during ultrasound (echo/ vascular) testing. Due to room size and safety concerns, children are not allowed in the ultrasound rooms during exams. Our front office staff cannot provide observation of children in our lobby area while testing is being conducted. An adult accompanying a patient to their appointment will only be allowed in the ultrasound room at the discretion of the ultrasound technician under special circumstances. We apologize for any inconvenience.    Follow-Up: At [facility], you and your health needs are our priority.  As part of our continuing mission to provide you with exceptional heart care, we have created designated Provider Care Teams.  These Care Teams include your primary Cardiologist (physician) and Advanced Practice Providers (APPs -  Physician Assistants and Nurse  Practitioners) who all work together to provide you with the care you need, when you need it.  We recommend signing up for the patient portal called "MyChart".  Sign up information is provided on this After Visit Summary.  MyChart is used to connect with patients for Virtual Visits (Telemedicine).  Patients are able to view lab/test results, encounter notes, upcoming appointments, etc.  Non-urgent messages can be sent to your provider as well.   To learn more about what you can do with MyChart, go to [email or web address].    Your next appointment:   1 year(s)  Provider:   Olga Millers, MD

## 2023-10-25 NOTE — Progress Notes (Signed)
  Subjective:  Patient ID: [name], male    DOB: [birth year],  MRN: [id number]  63 y.o. male presents at risk foot care with history of diabetic neuropathy and painful mycotic toenails of both feet that are difficult to trim. Pain interferes with daily activities and wearing enclosed shoe gear comfortably. Patient had matrixectomy of left great toe performed by Dr. Allena Katz. He is pleased with outcome of procedure. His wife is present during today's visit. Chief Complaint  Patient presents with   Diabetes    "Clip my toenails and check my feet."   New problem(s): None   PCP is [name], MD , and last visit was May 25, 2023.  Allergies  Allergen Reactions   Shellfish Allergy Nausea And Vomiting   Buspar [Buspirone] Other (See Comments)    headache    Review of Systems: Negative except as noted in the HPI.   Objective:  [name] is a pleasant 63 y.o. male morbidly obese in NAD. AAO x 3.  Vascular Examination: Vascular status intact b/l with palpable pedal pulses. CFT immediate b/l. Pedal hair present. No edema. No pain with calf compression b/l. Skin temperature gradient WNL b/l. No varicosities noted. No cyanosis or clubbing noted.  Neurological Examination: Sensation grossly intact b/l with 10 gram monofilament. Vibratory sensation intact b/l.  Dermatological Examination: Pedal skin with normal turgor, texture and tone b/l. No open wounds nor interdigital macerations noted. Toenails right great toe and 2-5 b/l thick, discolored, elongated with subungual debris and pain on dorsal palpation. No hyperkeratotic lesions noted b/l.   Musculoskeletal Examination: Muscle strength 5/5 to b/l LE.  No pain, crepitus noted b/l. No gross pedal deformities. Patient ambulates independently without assistive aids.   Radiographs: None  Last A1c:      Latest Ref Rng & Units 05/25/2023    9:18 AM 11/20/2022    8:55 AM  Hemoglobin A1C  Hemoglobin-A1c 4.6 - 6.5 %  6.3  6.7      Assessment:   1. Pain due to onychomycosis of toenail   2. Diabetic polyneuropathy associated with type 2 diabetes mellitus (HCC)    Plan:  Patient was evaluated and treated. All patient's and/or POA's questions/concerns addressed on today's visit. Toenails 2-5 bilaterally and right great toe debrided in length and girth without incident. Continue foot and shoe inspections daily. Monitor blood glucose per PCP/Endocrinologist's recommendations. Continue soft, supportive shoe gear daily. Report any pedal injuries to medical professional. Call office if there are any questions/concerns. -Patient/POA to call should there be question/concern in the interim.  Return in about 3 months (around 01/15/2024).  Freddie Breech, DPM      [place] LOCATION: [address] [place] [address]                   Office [phone]   [facility] LOCATION: [address] [place], [place] [address] Office [phone]

## 2023-11-01 ENCOUNTER — Ambulatory Visit (INDEPENDENT_AMBULATORY_CARE_PROVIDER_SITE_OTHER): Payer: Medicare Other | Admitting: Physician Assistant

## 2023-11-01 ENCOUNTER — Encounter: Payer: Self-pay | Admitting: Physician Assistant

## 2023-11-01 DIAGNOSIS — F411 Generalized anxiety disorder: Secondary | ICD-10-CM | POA: Diagnosis not present

## 2023-11-01 DIAGNOSIS — G4700 Insomnia, unspecified: Secondary | ICD-10-CM | POA: Diagnosis not present

## 2023-11-01 DIAGNOSIS — F3341 Major depressive disorder, recurrent, in partial remission: Secondary | ICD-10-CM

## 2023-11-01 MED ORDER — ALPRAZOLAM 0.5 MG PO TABS: 0.5000 mg | ORAL_TABLET | Freq: Two times a day (BID) | ORAL | 5 refills | Status: DC | PRN

## 2023-11-01 MED ORDER — TRAZODONE HCL 100 MG PO TABS: 100.0000 mg | ORAL_TABLET | Freq: Every evening | ORAL | 1 refills | Status: AC | PRN

## 2023-11-01 NOTE — Progress Notes (Signed)
 [facility] Med Check  Patient ID: [name],  MRN: [id number]  PCP: [name], MD  Date of Evaluation: 11/01/2023 Time spent:20 minutes  Chief Complaint:  Chief Complaint   Anxiety; Insomnia; Follow-up    HISTORY/CURRENT STATUS: HPI for routine med check.  Since he was here last time, his Mom died.  He and his siblings are taking care of her estate which is also stressful.  She lived in [place].  He's selling his house hear in [place] and will be buying another house here.  He has had issues with his daughter for quite a while but things are better with her and her husband right now.  He and his wife have had to set some boundaries.  Kharee went off the Cymbalta.  He does not go into it but felt like it was causing side effects and was not helpful enough.  He was not able to tolerate a dose higher than 30 mg.  Denies any depressive symptoms.  Energy and motivation are pretty good.  He has lost a lot of weight and his blood sugars have improved, which makes him feel better as well.  Appetite is normal and weight is stable.  Personal hygiene is normal.  He does not work due to physical and mental health.  He still has anxiety, more so getting overwhelmed than having panic attacks.  His last panic attack was probably 3 or 4 months ago.  He denies suicidal or homicidal thoughts.  The trazodone is still effective for sleep.  If he does not take it he cannot fall asleep or stay asleep well.  It does make him groggy the next morning but states the benefits outweigh that side effect.  Patient denies increased energy with decreased need for sleep, increased talkativeness, racing thoughts, impulsivity or risky behaviors, increased spending, increased libido, grandiosity, increased irritability or anger, paranoia, or hallucinations.  Has unsteady gait so he walks with a cane.  Denies dizziness, syncope, seizures, numbness, tingling, tremor, tics, slurred speech, confusion.  Denies muscle or joint pain, stiffness, or dystonia.   Individual Medical History/ Review of Systems: Changes?  See HPI  Past medications for mental health diagnoses include: Cymbalta, Trazodone, Xanax, BuSpar caused headaches  Allergies: Shellfish allergy and Buspar [buspirone]  Current Medications:  Current Outpatient Medications:    acetaminophen (TYLENOL) 325 MG tablet, Take 2 tablets (650 mg total) by mouth every 6 (six) hours as needed for mild pain (or Fever >/= 101)., Disp: , Rfl:    albuterol (VENTOLIN HFA) 108 (90 Base) MCG/ACT inhaler, INHALE 2 PUFFS BY MOUTH EVERY 4 HOURS AS NEEDED FOR WHEEZE OR FOR SHORTNESS OF BREATH, Disp: 8.5 each, Rfl: 3   aspirin 81 MG chewable tablet, Chew 1 tablet (81 mg total) by mouth daily., Disp: , Rfl:    Blood Glucose Monitoring Suppl (FREESTYLE LITE) w/Device KIT, Use to test blood sugars daily. Dx: E11.9, Disp: 1 kit, Rfl: 3   carvedilol (COREG) 12.5 MG tablet, Take 1 tablet (12.5 mg total) by mouth 2 (two) times daily., Disp: 180 tablet, Rfl: 3   fluticasone furoate-vilanterol (BREO ELLIPTA) 200-25 MCG/ACT AEPB, Inhale 1 puff into the lungs daily., Disp: 90 each, Rfl: 3   glucose blood (FREESTYLE LITE) test strip, USE 1 STRIP TO CHECK GLUCOSE ONCE DAILY. DX: E11.9, Disp: 100 strip, Rfl: 3   Lancets (FREESTYLE) lancets, USE 1  TO CHECK GLUCOSE ONCE DAILY. Dx: E11.9, Disp: 100 each, Rfl: 0   Multiple Vitamin (MULTIVITAMIN) tablet, Take 1  tablet by mouth daily., Disp: , Rfl:    NON FORMULARY, 1 each by Other route See admin instructions. Use CPAP machine nightly., Disp: , Rfl:    omeprazole (PRILOSEC) 40 MG capsule, Take 1 capsule (40 mg total) by mouth daily. Office visit for further refills, Disp: 90 capsule, Rfl: 0   rosuvastatin (CRESTOR) 20 MG tablet, Take 1 tablet (20 mg total) by mouth daily., Disp: 90 tablet, Rfl: 3   sacubitril-valsartan (ENTRESTO) 97-103 MG, TAKE 1 TABLET BY MOUTH TWICE A DAY, Disp: 180 tablet, Rfl: 0   spironolactone  (ALDACTONE) 25 MG tablet, Take 0.5 tablets (12.5 mg total) by mouth daily., Disp: 45 tablet, Rfl: 3   tiZANidine (ZANAFLEX) 2 MG tablet, Take 1 tablet (2 mg total) by mouth every 8 (eight) hours as needed for muscle spasms (in low back. do not drive for 8 hours after use)., Disp: 30 tablet, Rfl: 1   ALPRAZolam (XANAX) 0.5 MG tablet, Take 1 tablet (0.5 mg total) by mouth 2 (two) times daily as needed for anxiety., Disp: 30 tablet, Rfl: 5   Dulaglutide (TRULICITY) 1.5 MG/0.5ML SOPN, Inject 1.5 mg into the skin once a week. (Patient not taking: Reported on 11/01/2023), Disp: 5 mL, Rfl: 5   DULoxetine (CYMBALTA) 30 MG capsule, Take 1 capsule (30 mg total) by mouth daily. (Patient not taking: Reported on 11/01/2023), Disp: 90 capsule, Rfl: 1   empagliflozin (JARDIANCE) 25 MG TABS tablet, Take 1 tablet (25 mg total) by mouth daily before breakfast. (Patient not taking: Reported on 11/01/2023), Disp: 30 tablet, Rfl: 5   furosemide (LASIX) 40 MG tablet, Take 1 tablet (40 mg total) by mouth daily. (Patient not taking: Reported on 11/01/2023), Disp: 90 tablet, Rfl: 3   traZODone (DESYREL) 100 MG tablet, Take 1-2 tablets (100-200 mg total) by mouth at bedtime as needed., Disp: 180 tablet, Rfl: 1 Medication Side Effects: none  Family Medical/ Social History: Changes? No  MENTAL HEALTH EXAM:  There were no vitals taken for this visit.There is no height or weight on file to calculate BMI.  General Appearance: Casual, Well Groomed, and Obese  Eye Contact:  Good  Speech:  Clear and Coherent and Normal Rate  Volume:  Normal  Mood:  Euthymic  Affect:  Congruent  Thought Process:  Goal Directed and Descriptions of Associations: Circumstantial  Orientation:  Full (Time, Place, and Person)  Thought Content: Logical   Suicidal Thoughts:  No  Homicidal Thoughts:  No  Memory:  WNL  Judgement:  Good  Insight:  Good  Psychomotor Activity:   Walks slowly with a cane  Concentration:  Concentration: Good  Recall:   Good  Fund of Knowledge: Good  Language: Good  Assets:  Communication Skills Desire for Improvement Financial Resources/Insurance Housing Transportation  ADL's:  Intact  Cognition: WNL  Prognosis:  Good   DIAGNOSES:    ICD-10-CM   1. Recurrent major depressive disorder, in partial remission (HCC)  F33.41     2. Insomnia, unspecified type  G47.00     3. Generalized anxiety disorder  F41.1       Receiving Psychotherapy: Yes    with Rockne Menghini, LCSW  RECOMMENDATIONS:  PDMP reviewed.  Xanax filled 03/15/2023. I provided 20 minutes of face to face time during this encounter, including time spent before and after the visit in records review, medical decision making, counseling pertinent to today's visit, and charting.   He is doing well overall so no changes in medications are necessary.  Continue Xanax 0.5  mg, 1 p.o. twice daily as needed. Continue trazodone 100 mg, 1-2 nightly as needed sleep. Continue vitamins as per med list. Continue therapy with Rockne Menghini, LCSW. Return in 6 months.  Melony Overly, PA-C

## 2023-11-02 ENCOUNTER — Ambulatory Visit (INDEPENDENT_AMBULATORY_CARE_PROVIDER_SITE_OTHER): Payer: Medicare Other | Admitting: Psychiatry

## 2023-11-02 DIAGNOSIS — F3341 Major depressive disorder, recurrent, in partial remission: Secondary | ICD-10-CM | POA: Diagnosis not present

## 2023-11-02 NOTE — Progress Notes (Signed)
 [facility] Counselor/Therapist Progress Note  Patient ID: [name], MRN: [id number],    Date: 11/02/2023  Time Spent: 50 minutes   Treatment Type: Individual Therapy  Reported Symptoms: anxiety, depression, (some back pain that hinders sleeping sometimes)    Mental Status Exam:  Appearance:   Casual     Behavior:  Appropriate, Sharing, and Motivated  Motor:  Walks with a cane  Speech/Language:   Clear and Coherent  Affect:  Depressed and anxious  Mood:  anxious and depressed  Thought process:  goal directed  Thought content:    WNL  Sensory/Perceptual disturbances:    WNL  Orientation:  oriented to person, place, time/date, situation, day of week, month of year, year, and stated date of Feb. 25, 2025  Attention:  Good  Concentration:  Good  Memory:  WNL  Fund of knowledge:   Good  Insight:    Good  Judgment:   Good  Impulse Control:  Good   Risk Assessment: Danger to Self:  No Self-injurious Behavior: No Danger to Others: No Duty to Warn:no Physical Aggression / Violence:No  Access to Firearms a concern: No  Gang Involvement:No   Subjective:  Patient in office today and reporting anxiety, depression, back pain which affects his depression, and some improvement in family relationships (including their adult children and their grandchildren).  Patient seems to be feeling more happy and grateful for better relationships within the family in recent months and hoping this will continue into the future.  He continues to have some health issues however has lost some weight to be healthier and is continuing to be monitored for other health related conditions and medications.  Since his last appointment, his mother did die and he feels that "while it was difficult, he handled it as well as possible".  A positive for him was the support he had from 3 friends and family during that time recently.  Continues trying to use strategies discussed earlier in sessions to help  deal with his stress, anxiety, and some depression but also feeling that he is making some progress even with these challenges more recently.  Needed part of time in session today to speak more about the loss of his mother and receive support and encouragement.  Wife continues to be very helpful and supportive.  Discussed his goals moving forward and how to continue working on his anxiety and depression, while nurturing family relationships, and making significant decisions for he and his wife as they are downsizing and will be selling their home in the near future and deciding next steps and what housing situation is best for them.  Patient will be following up to schedule his next appointment here with therapist.  Interventions: Cognitive Behavioral Therapy, Solution-Oriented/Positive Psychology, and Ego-Supportive  Long term goal: Develop the ability to recognize, accept, and cope with feelings of depression/anxiety. Short term goal: Identify and replace depressive/anxious thinking that leads to depressive/anxious feelings and actions. Strategy: Educate patient about cognitive restructuring, including self-monitoring of automatic thoughts, and working to interrupt his depressive/anxious thought patterns and replace with cognitive patterns that do not support depression or anxiety.  Diagnosis:   ICD-10-CM   1. Recurrent major depressive disorder, in partial remission (HCC)  F33.41      Plan: Today patient in session and focusing more on his depression, anxiety, some physical issues, and also noting some improvement within family relationships especially their adult children and grandchildren.  He is so hopeful that these relationship improvements  will continue.  Better outlook overall for patient and yet understands he still has issues he is working on and remains motivated. Encouraged patient and practicing more positive and self affirming behaviors as noted in session including: Intentionally  looks for more positives each day, staying in the present rather than jumping too far into the future, focus on what he can control versus cannot, positive self talk, stay in contact with people who are supportive, letting his faith be a resource for his emotional health as well as spiritual, healthy boundaries with others, and feel good about the strength he shows working with goal-directed behaviors.   Self rated scales:  1-10 depression scale- 5 1-10 anxiety scale- 5  Goal review and progress/challenges noted with patient.  Next appointment within   Mathis Fare, LCSW

## 2023-11-19 NOTE — Telephone Encounter (Signed)
 Saw teresa on 2/24

## 2023-11-23 ENCOUNTER — Ambulatory Visit: Payer: Medicare Other | Admitting: Family Medicine

## 2023-11-24 ENCOUNTER — Telehealth: Payer: Self-pay | Admitting: *Deleted

## 2023-11-24 DIAGNOSIS — G4733 Obstructive sleep apnea (adult) (pediatric): Secondary | ICD-10-CM

## 2023-11-24 NOTE — Telephone Encounter (Signed)
 We received a message from [employer] stating pt had asked them for cpap supplies but they do not have an order. Per our records, [facility] is pt's DME. I called the pt and LVM (ok per DPR) asking for call back to let us know if he is desiring to switch DMEs or if this was a mistake.

## 2023-11-25 ENCOUNTER — Ambulatory Visit (HOSPITAL_COMMUNITY)
Admission: RE | Admit: 2023-11-25 | Payer: Medicare Other | Source: Ambulatory Visit | Attending: Adult Health | Admitting: Adult Health

## 2023-12-02 ENCOUNTER — Ambulatory Visit: Payer: Medicare Other | Admitting: Neurology

## 2023-12-13 NOTE — Telephone Encounter (Signed)
 Called pt again and LVM (ok per DPR). Also sent mychart message.

## 2023-12-14 ENCOUNTER — Ambulatory Visit: Payer: Medicare Other | Admitting: Psychiatry

## 2023-12-28 ENCOUNTER — Ambulatory Visit (HOSPITAL_COMMUNITY): Admission: RE | Admit: 2023-12-28 | Source: Ambulatory Visit | Attending: Adult Health | Admitting: Adult Health

## 2023-12-28 ENCOUNTER — Encounter (HOSPITAL_COMMUNITY): Payer: Self-pay | Admitting: Adult Health

## 2023-12-28 ENCOUNTER — Ambulatory Visit: Admitting: Family Medicine

## 2023-12-29 ENCOUNTER — Ambulatory Visit (INDEPENDENT_AMBULATORY_CARE_PROVIDER_SITE_OTHER)

## 2023-12-29 VITALS — Ht 70.0 in | Wt 270.0 lb

## 2023-12-29 DIAGNOSIS — Z0000 Encounter for general adult medical examination without abnormal findings: Secondary | ICD-10-CM
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
## 2023-12-29 NOTE — Patient Instructions (Signed)
 [name]  , Thank you for taking time to come for your Medicare Wellness Visit. I appreciate your ongoing commitment to your health goals. Please review the following plan we discussed and let me know if I can assist you in the future.   Referrals/Orders/Follow-Ups/Clinician Recommendations: continue to work on losing weight Each day, aim for 6 glasses of water, plenty of protein in your diet and try to get up and walk/ stretch every hour for 5-10 minutes at a time.    This is a list of the screening recommended for you and due dates:  Health Maintenance  Topic Date Due   Pneumococcal Vaccination (1 of 2 - PCV) Never done   Zoster (Shingles) Vaccine (1 of 2) Never done   Yearly kidney health urinalysis for diabetes  11/20/2023   Hemoglobin A1C  11/22/2023   Medicare Annual Wellness Visit  11/23/2023   Flu Shot  04/07/2024   Yearly kidney function blood test for diabetes  05/24/2024   Complete foot exam   06/24/2024   Eye exam for diabetics  09/29/2024   DTaP/Tdap/Td vaccine (3 - Td or Tdap) 07/20/2027   Colon Cancer Screening  10/24/2031   Hepatitis C Screening  Completed   HIV Screening  Completed   HPV Vaccine  Aged Out   Meningitis B Vaccine  Aged Out   COVID-19 Vaccine  Discontinued    Advanced directives: (Declined) Advance directive discussed with you today. Even though you declined this today, please call our office should you change your mind, and we can give you the proper paperwork for you to fill out.  Next Medicare Annual Wellness Visit scheduled for next year: Yes

## 2024-01-04 ENCOUNTER — Ambulatory Visit (INDEPENDENT_AMBULATORY_CARE_PROVIDER_SITE_OTHER): Admitting: Psychiatry

## 2024-01-04 DIAGNOSIS — F411 Generalized anxiety disorder: Secondary | ICD-10-CM

## 2024-01-04 NOTE — Progress Notes (Signed)
 [facility] Counselor/Therapist Progress Note  Patient ID: [name] , MRN: [id number],    Date: 01/04/2024  Time Spent: 48 minutes   Treatment Type: Individual Therapy  Virtual Visit via Telehealth Note: Telephone session as patient not able to do the video session Connected with patient by a telemedicine/telehealth application, with their informed consent, and verified patient privacy and that I am speaking with the correct person using two identifiers. I discussed the limitations, risks, security and privacy concerns of performing psychotherapy and the availability of in person appointments. I also discussed with the patient that there may be a patient responsible charge related to this service. The patient expressed understanding and agreed to proceed. I discussed the treatment planning with the patient. The patient was provided an opportunity to ask questions and all were answered. The patient agreed with the plan and demonstrated an understanding of the instructions. The patient was advised to call  our office if  symptoms worsen or feel they are in a crisis state and need immediate contact.   Therapist Location: office Patient Location: home   Reported Symptoms: anxiety, depression, stressed with back pain, no thoughts to harm self nor others   Mental Status Exam:  Appearance:   N/a  telehealth      Behavior:  Appropriate and Sharing  Motor:  Uses cane in walking due to knee issues  Speech/Language:   Clear and Coherent  Affect:  N/a  telehealth  Mood:  anxious and depressed  Thought process:  normal  Thought content:    WNL  Sensory/Perceptual disturbances:    WNL  Orientation:  oriented to person, place, time/date, situation, day of week, month of year, year, and stated date of January 04, 2024  Attention:  Good  Concentration:  Fair  Memory:  WNL  Fund of knowledge:   Good  Insight:    Good  Judgment:   Good  Impulse Control:  Good   Risk  Assessment: Danger to Self:  No Self-injurious Behavior: No Danger to Others: No Duty to Warn:no Physical Aggression / Violence:No  Access to Firearms a concern: No  Gang Involvement:No   Subjective:  Patient today reporting anxiety, depression, and some back pain which impacts his depression and anxiety. Continued visits with 67 yr old grandson now that mom is allowing it. "Family" extremely important to patient. Mom's 86th birthday would have been this past week and that was a difficult time for patient as mom died last yr and he still "thinks and remembers her often." Shared several memories of his mother and that seemed to help his mood too. Sold and moved out of house sold to a company, and moved into an apartment for now. Has met a few of the new neighbors. May be at apt 6 months. Some improvement in relationships within the family and seem to be able to work together better. Trying to take better care of him self and has lost weight down to 268 lbs (from 344 lbs). Remains in contact with his doctors and is good about following up on medical advice. States he continues his work to better manage stress, anxiety, and depression. Talked further about loss of his mom who he had been very close to over the years, and it seemed helpful for patient to share some memories. Shares that his wife is very attentive and supportive in patient's issues of grief and loss and he is feeling better overall with his grief.  Continued work on current goals  and will have another appointment with patient in approximately 4 to 6 weeks, and he knows he can call in the meantime if needed.  Interventions: Cognitive Behavioral Therapy and Ego-Supportive Long term goal: Develop the ability to recognize, accept, and cope with feelings of depression/anxiety. Short term goal: Identify and replace depressive/anxious thinking that leads to depressive/anxious feelings and actions. Strategy: Educate patient about cognitive  restructuring, including self-monitoring of automatic thoughts, and working to interrupt his depressive/anxious thought patterns and replace with cognitive patterns that do not support depression or anxiety.   Diagnosis:   ICD-10-CM   1. Generalized anxiety disorder  F41.1      Plan:  Patient today and telehealth session working further on his depression, anxiety, some physical issues, and also noticing and sharing some improvement within family relationships that has continued since last appointment.  Feels more hopeful and optimistic about the family going forward which also helps him and his personal struggles with anxiety and some depression.  Overall outlook continues to be more positive although still working on his therapy goals and feeling good about some of the progress he has made.  Encouraged patient in his practice of more positive and self affirming behaviors as discussed in session including: Remain in the present rather than jumping ahead into the future, intentionally look for more positives each day versus negatives, focus on what he can control versus cannot, use of positive self talk, stay in contact with people who are supportive, letting his faith be a resource for his emotional health as well as spiritual, use of healthy boundaries, and feel good about the strength he shows as he continues working with goal-directed behaviors.  [name]  has made progress and needs to continue working with goal-directed behaviors to keep moving and a more positive and hopeful direction emotionally and physically.  Self-rating scales: 1-10 depression scale-  6/7 1-10 anxiety scale-   6  Goal review and progress/challenges noted with patient.  Next appointment within 6 weeks and patient knows he can call if needed before that time.    Michael Patee, LCSW

## 2024-01-20 ENCOUNTER — Ambulatory Visit: Payer: Medicare Other | Admitting: Podiatry

## 2024-01-26 ENCOUNTER — Ambulatory Visit (HOSPITAL_COMMUNITY)

## 2024-02-18 ENCOUNTER — Ambulatory Visit
Admission: EM | Admit: 2024-02-18 | Discharge: 2024-02-18 | Disposition: A | Discharge status: Home / Self Care | Attending: Physician Assistant | Admitting: Physician Assistant

## 2024-02-18 ENCOUNTER — Ambulatory Visit (INDEPENDENT_AMBULATORY_CARE_PROVIDER_SITE_OTHER)

## 2024-02-18 ENCOUNTER — Other Ambulatory Visit: Payer: Self-pay

## 2024-02-18 ENCOUNTER — Encounter: Payer: Self-pay | Admitting: Emergency Medicine

## 2024-02-18 DIAGNOSIS — R0602 Shortness of breath: Secondary | ICD-10-CM | POA: Diagnosis not present

## 2024-02-18 DIAGNOSIS — F410 Panic disorder [episodic paroxysmal anxiety] without agoraphobia: Secondary | ICD-10-CM | POA: Insufficient documentation
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
## 2024-02-18 NOTE — Discharge Instructions (Signed)
 I am glad that you are feeling better.  Your EKG was stable.  I do not see anything on your chest x-ray and we will contact you if the radiologist sees something that changes our treatment plan.  As we discussed, we cannot rule out that your heart is contributing to your symptoms but since you are feeling so much better and not having any chest pain anxious more likely this is a panic attack.  If you have any recurrent symptoms including shortness of breath or develop chest pain I want you to go to the emergency room immediately as we discussed.

## 2024-02-18 NOTE — ED Triage Notes (Addendum)
 Pt presents c/o SOB since last night. Pt denies any additional sxs. Pt believes he is having an anxiety attack.

## 2024-03-06 ENCOUNTER — Ambulatory Visit (HOSPITAL_COMMUNITY)
Admission: RE | Admit: 2024-03-06 | Discharge: 2024-03-06 | Disposition: A | Discharge status: Home / Self Care | Source: Ambulatory Visit | Attending: Cardiovascular Disease | Admitting: Cardiovascular Disease

## 2024-03-06 DIAGNOSIS — I10 Essential (primary) hypertension: Secondary | ICD-10-CM | POA: Diagnosis not present

## 2024-03-06 DIAGNOSIS — I420 Dilated cardiomyopathy: Secondary | ICD-10-CM

## 2024-03-06 DIAGNOSIS — I509 Heart failure, unspecified: Secondary | ICD-10-CM

## 2024-03-06 DIAGNOSIS — E7800 Pure hypercholesterolemia, unspecified: Secondary | ICD-10-CM | POA: Diagnosis not present

## 2024-03-06 LAB — ECHOCARDIOGRAM COMPLETE
Area-P 1/2: 4.06 cm2
S' Lateral: 3.8 cm

## 2024-03-08 ENCOUNTER — Ambulatory Visit: Payer: Self-pay | Admitting: Adult Health

## 2024-03-08 NOTE — Progress Notes (Signed)
 I have reviewed echo report.  His heart pumping function continues to improve and is now normal and improvement from the last echo. His valves are normal as well.  He is to continue the current regimen of medications to include Entresto , carvedilol , Jardiance , and spironolactone .   Follow up labs with a BMET is recommended and can be done of follow up appointment with PCP or cardiology.  Good report!

## 2024-03-16 NOTE — Telephone Encounter (Signed)
 Spoke with patient. He is using [facility] now and would like an order for CPAP supplies sent there.

## 2024-03-16 NOTE — Telephone Encounter (Signed)
 New, [name], Otilio Jefferson, RN; Alain Honey; Jeris Penta, [place]; 1 other Received, thank you!

## 2024-03-16 NOTE — Addendum Note (Signed)
 Addended by: HILLIARD HEATHER CROME on: 03/16/2024 11:28 AM   Modules accepted: Orders

## 2024-04-03 ENCOUNTER — Telehealth: Payer: Self-pay | Admitting: Family Medicine

## 2024-04-03 NOTE — Telephone Encounter (Unsigned)
 Copied from CRM [id number]. Topic: General - Call Back - No Documentation >> Apr 03, 2024  2:49 PM Rea C wrote: Reason for CRM: Patient's spouse called in requesting if Dr. Katrinka can write a letter explaining patient's disability and situation to send to the court to be exempt from jury duty. Patient's spouse stated that letter can be mailed to address on file.   [phone] (H)

## 2024-04-05 NOTE — Telephone Encounter (Signed)
 Please see below and schedule ov to discuss.

## 2024-04-05 NOTE — Telephone Encounter (Signed)
 What portion of jury duty does he not feel he could perform? He does have nonischemic cardiomyopathy but is of sound mind so once seated should be able to mentally participate. There should be ADA accomodations so he can get to the court. I am happy to have him in for a visit to discuss this more if he would like to discuss. He is overdue for his march follow up and has not scheduled directly with me as requested at last visit

## 2024-04-18 ENCOUNTER — Telehealth: Payer: Self-pay | Admitting: Family Medicine

## 2024-04-18 VITALS — BP 102/78 | HR 57 | Temp 98.4°F | Ht 70.0 in | Wt 267.2 lb

## 2024-04-18 DIAGNOSIS — E1169 Type 2 diabetes mellitus with other specified complication: Secondary | ICD-10-CM | POA: Diagnosis not present

## 2024-04-18 DIAGNOSIS — I10 Essential (primary) hypertension: Secondary | ICD-10-CM

## 2024-04-18 DIAGNOSIS — E119 Type 2 diabetes mellitus without complications: Secondary | ICD-10-CM

## 2024-04-18 DIAGNOSIS — I428 Other cardiomyopathies: Secondary | ICD-10-CM | POA: Diagnosis not present

## 2024-04-18 DIAGNOSIS — Z7985 Long-term (current) use of injectable non-insulin antidiabetic drugs: Secondary | ICD-10-CM | POA: Diagnosis not present

## 2024-04-18 DIAGNOSIS — F411 Generalized anxiety disorder: Secondary | ICD-10-CM | POA: Diagnosis not present

## 2024-04-18 DIAGNOSIS — Z6838 Body mass index (BMI) 38.0-38.9, adult: Secondary | ICD-10-CM

## 2024-04-18 DIAGNOSIS — E785 Hyperlipidemia, unspecified: Secondary | ICD-10-CM

## 2024-04-18 DIAGNOSIS — Z7984 Long term (current) use of oral hypoglycemic drugs: Secondary | ICD-10-CM

## 2024-04-18 DIAGNOSIS — E66812 Obesity, class 2: Secondary | ICD-10-CM

## 2024-04-18 NOTE — Addendum Note (Signed)
 Addended by: [name] on: 04/18/2024 04:15 PM   Modules accepted: Level of Service

## 2024-04-18 NOTE — Patient Instructions (Addendum)
 Has not tried zyrtec or allegra- may trial for up to 2 weeks.   Please stop by lab before you go If you have mychart- we will send your results within 3 business days of us  receiving them.  If you do not have mychart- we will call you about results within 5 business days of us  receiving them.  *please also note that you will see labs on mychart as soon as they post. I will later go in and write notes on them- will say notes from Dr. Katrinka   Recommended follow up: Return in about 6 months (around 10/19/2024) for followup or sooner if needed.Schedule b4 you leave.

## 2024-04-18 NOTE — Progress Notes (Addendum)
 Phone [phone] In person visit   Subjective:   [name]  is a 63 y.o. year old very pleasant male patient who presents for/with See problem oriented charting Chief Complaint  Patient presents with   Medical Management of Chronic Issues    Pt needs a letter for jury duty; pt would like to discuss respiratory issues;    Diabetes   Congestive Heart Failure    Past Medical History-  Patient Active Problem List   Diagnosis Date Noted   Diabetes mellitus without complication (HCC) 11/19/2020    Priority: High   NICM (nonischemic cardiomyopathy) (HCC)     Priority: High   Hyperlipidemia associated with type 2 diabetes mellitus (HCC) 05/20/2022    Priority: Medium    Osteoarthritis, knee 11/19/2020    Priority: Medium    Asthma 11/19/2020    Priority: Medium    GAD (generalized anxiety disorder) 11/19/2020    Priority: Medium    GERD (gastroesophageal reflux disease) 11/19/2020    Priority: Medium    Major depressive disorder, recurrent episode, in full remission (HCC) 06/16/2018    Priority: Medium    Normal coronary arteries 08/10/2017    Priority: Medium    Sleep apnea 08/10/2017    Priority: Medium    Essential hypertension 11/13/2014    Priority: Medium    Allergic rhinitis 11/19/2020    Priority: Low   Morbid obesity (HCC) 08/10/2017    Priority: Low   Anorectal abscess,left 07/27/2011    Priority: Low   Bilateral impacted cerumen 03/30/2017   Chronic diffuse otitis externa of both ears 03/30/2017    Medications- reviewed and updated Current Outpatient Medications  Medication Sig Dispense Refill   acetaminophen  (TYLENOL ) 325 MG tablet Take 2 tablets (650 mg total) by mouth every 6 (six) hours as needed for mild pain (or Fever >/= 101).     albuterol  (VENTOLIN  HFA) 108 (90 Base) MCG/ACT inhaler INHALE 2 PUFFS BY MOUTH EVERY 4 HOURS AS NEEDED FOR WHEEZE OR FOR SHORTNESS OF BREATH 8.5 each 3   ALPRAZolam  (XANAX ) 0.5 MG tablet Take 1 tablet (0.5 mg total)  by mouth 2 (two) times daily as needed for anxiety. 30 tablet 5   aspirin  81 MG chewable tablet Chew 1 tablet (81 mg total) by mouth daily.     Blood Glucose Monitoring Suppl (FREESTYLE LITE) w/Device KIT Use to test blood sugars daily. Dx: E11.9 1 kit 3   carvedilol  (COREG ) 12.5 MG tablet Take 1 tablet (12.5 mg total) by mouth 2 (two) times daily. 180 tablet 3   empagliflozin  (JARDIANCE ) 25 MG TABS tablet Take 1 tablet (25 mg total) by mouth daily before breakfast. 30 tablet 5   fluticasone  furoate-vilanterol (BREO ELLIPTA ) 200-25 MCG/ACT AEPB Inhale 1 puff into the lungs daily. 90 each 3   glucose blood (FREESTYLE LITE) test strip USE 1 STRIP TO CHECK GLUCOSE ONCE DAILY. DX: E11.9 100 strip 3   Lancets (FREESTYLE) lancets USE 1  TO CHECK GLUCOSE ONCE DAILY. Dx: E11.9 100 each 0   Multiple Vitamin (MULTIVITAMIN) tablet Take 1 tablet by mouth daily.     omeprazole  (PRILOSEC) 40 MG capsule Take 1 capsule (40 mg total) by mouth daily. Office visit for further refills 90 capsule 0   rosuvastatin  (CRESTOR ) 20 MG tablet Take 1 tablet (20 mg total) by mouth daily. 90 tablet 3   sacubitril -valsartan  (ENTRESTO ) 97-103 MG TAKE 1 TABLET BY MOUTH TWICE A DAY 180 tablet 0   spironolactone  (ALDACTONE ) 25 MG tablet Take 0.5 tablets (  12.5 mg total) by mouth daily. 45 tablet 3   tiZANidine  (ZANAFLEX ) 2 MG tablet Take 1 tablet (2 mg total) by mouth every 8 (eight) hours as needed for muscle spasms (in low back. do not drive for 8 hours after use). 30 tablet 1   traZODone  (DESYREL ) 100 MG tablet Take 1-2 tablets (100-200 mg total) by mouth at bedtime as needed. 180 tablet 1   NON FORMULARY 1 each by Other route See admin instructions. Use CPAP machine nightly. (Patient not taking: Reported on 04/18/2024)     No current facility-administered medications for this visit.     Objective:  BP 102/78 (BP Location: Left Arm, Patient Position: Sitting, Cuff Size: Normal)   Pulse (!) 57   Temp 98.4 F (36.9 C) (Temporal)    Ht 5' 10 (1.778 m)   Wt 267 lb 3.2 oz (121.2 kg)   SpO2 98%   BMI 38.34 kg/m  Gen: NAD, resting comfortably CV: RRR no murmurs rubs or gallops Lungs: CTAB no crackles, wheeze, rhonchi Ext: trace edema Skin: warm, dry Walks with cane     Assessment and Plan   #social update- jury duty letter support given  # Diabetes S: Medication:Trulicity  1.5 mg in the past- has not been using regularly lately as his appetite has been naturally lower since COVID -is taking Jardiance  25 mg -metformin caused diarrhea  CBGs- not checking much Exercise and diet- down 23 lbs in last year Lab Results  Component Value Date   HGBA1C 6.3 05/25/2023   HGBA1C 6.7 (H) 11/20/2022   HGBA1C 5.9 05/20/2022   A/P: hopefully stable- update a1c today. Continue current meds for now - jardiance  only 0 remove Trulicity  as not taking  # Morbid obesity with BMI over 35 with hypertension and hyperlipidemia-patient working on gradual weight loss-he does report over time his appetite has decreased and that has been helpful-no longer on Trulicity   #Nonischemic cardiomyopathy-improved EF on most recent echo #hypertension S: medication: Carvedilol  12.5 mg twice daily, Entresto  97-103 mg, spironolactone  12.5 mg daily -  Lasix  40 mg as needed only- not needing at all on spironolactone  though Home blood pressure: reports around 100s-120s/70s. No tlightheaded with lower bp BP Readings from Last 3 Encounters:  04/18/24 102/78  02/18/24 (!) 152/80  10/25/23 112/74  A/P: nonischemic cardiomyopathy- no swelling and weight trending down - apperas euvolemic- continue current medications  Hypertension- blood pressure well controlled continue current medications- does not feel lightheaded with lower blood pressure   #hyperlipidemia S: Medication:Rosuvastatin  20 mg, also on aspirin  for primary prevention Lab Results  Component Value Date   CHOL 99 05/25/2023   HDL 56.20 05/25/2023   LDLCALC 31 05/25/2023   LDLDIRECT  66.0 04/18/2021   TRIG 62.0 05/25/2023   CHOLHDL 2 05/25/2023   A/P: hopefully stable- update LDL today. Continue current meds for now   # Asthma S: Maintenance Medication: Breo 100-25 1 puff daily--> 200-25 mg in March 2024 As needed medication: Albuterol . Patient is using this one to two times per week. Does help morning congestion A/P: reasonable control- continue current medications    #Depression/generalized anxiety disorder-managed by [facility] S: Medication: Managed by psychiatry with Cymbalta  30 mg BID, alprazolam  0.5 mg twice daily as needed, trazodone  100 to 200 mg before bed for insomnia as well as buspirone  - had bad panic attack in July- also felt shortness of breath- no further issues since then- triggered by stress- had just sold home A/P: depression in full remission. Anxiety reasonably controled. Still has  alprazolam  as needed for panic attakcs    #Orthopedic issues managed by [facility] orthopedics Dr. Liam S: Knee and shoulder arthritis. Tylenol  mainly A/P: ongoing struggle- uses cane   # GERD- omeprazole  40 mg daily- did not tolerate 20 mg- reasonable control  #OSA managed by [facility]-continue CPAP  -fonase did not help. Using saline solution before bed. Has not tried zyrtec or allegra- may trial for up to 2 weeks.     Recommended follow up: No follow-ups on file. Future Appointments  Date Time Provider Department Center  04/26/2024  4:15 PM Gaynel Delon CROME, [place] [facility]  05/01/2024  1:00 PM Rhys Verneita DASEN, PA-C [facility] None  10/18/2024  9:45 AM Lomax, Amy, NP [facility] None  01/04/2025  1:00 PM [facility] ANNUAL WELLNESS VISIT 1 [facility]    Lab/Order associations:   ICD-10-CM   1. Diabetes mellitus without complication (HCC)  E11.9     2. Morbid obesity (HCC) Chronic E66.01     3. Essential hypertension  I10     4. NICM (nonischemic cardiomyopathy) (HCC)  I42.8     5. GAD (generalized anxiety disorder)  F41.1     6.  Hyperlipidemia associated with type 2 diabetes mellitus (HCC)  E11.69    E78.5       No orders of the defined types were placed in this encounter.   Return precautions advised.  Garnette Lukes, MD

## 2024-04-19 ENCOUNTER — Ambulatory Visit: Payer: Self-pay | Admitting: Family Medicine

## 2024-04-19 DIAGNOSIS — N184 Chronic kidney disease, stage 4 (severe): Secondary | ICD-10-CM

## 2024-04-19 DIAGNOSIS — R809 Proteinuria, unspecified: Secondary | ICD-10-CM

## 2024-04-19 LAB — COMPREHENSIVE METABOLIC PANEL WITH GFR
ALT: 20 U/L (ref 0–53)
AST: 24 U/L (ref 0–37)
Albumin: 3.9 g/dL (ref 3.5–5.2)
Alkaline Phosphatase: 56 U/L (ref 39–117)
BUN: 32 mg/dL — ABNORMAL HIGH (ref 6–23)
CO2: 22 meq/L (ref 19–32)
Calcium: 8.9 mg/dL (ref 8.4–10.5)
Chloride: 105 meq/L (ref 96–112)
Creatinine, Ser: 2.63 mg/dL — ABNORMAL HIGH (ref 0.40–1.50)
GFR: 25.52 mL/min — ABNORMAL LOW (ref >60.00)
Glucose, Bld: 83 mg/dL (ref 70–99)
Potassium: 5 meq/L (ref 3.5–5.1)
Sodium: 137 meq/L (ref 135–145)
Total Bilirubin: 0.4 mg/dL (ref 0.2–1.2)
Total Protein: 7.1 g/dL (ref 6.0–8.3)

## 2024-04-19 LAB — CBC WITH DIFFERENTIAL/PLATELET
Basophils Absolute: 0.1 K/uL (ref 0.0–0.1)
Basophils Relative: 1 % (ref 0.0–3.0)
Eosinophils Absolute: 0.1 K/uL (ref 0.0–0.7)
Eosinophils Relative: 1.8 % (ref 0.0–5.0)
HCT: 37.7 % — ABNORMAL LOW (ref 39.0–52.0)
Hemoglobin: 12.6 g/dL — ABNORMAL LOW (ref 13.0–17.0)
Lymphocytes Relative: 18.5 % (ref 12.0–46.0)
Lymphs Abs: 1.2 K/uL (ref 0.7–4.0)
MCHC: 33.4 g/dL (ref 30.0–36.0)
MCV: 92.9 fl (ref 78.0–100.0)
Monocytes Absolute: 0.6 K/uL (ref 0.1–1.0)
Monocytes Relative: 8.9 % (ref 3.0–12.0)
Neutro Abs: 4.4 K/uL (ref 1.4–7.7)
Neutrophils Relative %: 69.8 % (ref 43.0–77.0)
Platelets: 186 K/uL (ref 150.0–400.0)
RBC: 4.06 Mil/uL — ABNORMAL LOW (ref 4.22–5.81)
RDW: 14.7 % (ref 11.5–15.5)
WBC: 6.3 K/uL (ref 4.0–10.5)

## 2024-04-19 LAB — MICROALBUMIN / CREATININE URINE RATIO
Creatinine,U: 165.1 mg/dL
Microalb Creat Ratio: 1109.2 mg/g — ABNORMAL HIGH (ref 0.0–30.0)
Microalb, Ur: 183.1 mg/dL — ABNORMAL HIGH (ref 0.0–1.9)

## 2024-04-19 LAB — LDL CHOLESTEROL, DIRECT: Direct LDL: 30 mg/dL

## 2024-04-19 LAB — HEMOGLOBIN A1C: Hgb A1c MFr Bld: 6.2 % (ref 4.6–6.5)

## 2024-04-23 ENCOUNTER — Other Ambulatory Visit: Payer: Self-pay | Admitting: Family Medicine

## 2024-04-24 ENCOUNTER — Ambulatory Visit: Admitting: Family Medicine

## 2024-04-24 MED ORDER — ROSUVASTATIN CALCIUM 10 MG PO TABS
10.0000 mg | ORAL_TABLET | Freq: Every day | ORAL | 3 refills | Status: AC
Start: 2024-04-24 — End: ?

## 2024-04-26 ENCOUNTER — Ambulatory Visit (INDEPENDENT_AMBULATORY_CARE_PROVIDER_SITE_OTHER): Admitting: Podiatry

## 2024-04-26 DIAGNOSIS — Z91198 Patient's noncompliance with other medical treatment and regimen for other reason: Secondary | ICD-10-CM

## 2024-04-26 NOTE — Progress Notes (Signed)
 1. Failure to attend appointment with reason given    Patient rescheduled appointment.

## 2024-05-01 ENCOUNTER — Ambulatory Visit (INDEPENDENT_AMBULATORY_CARE_PROVIDER_SITE_OTHER): Payer: Medicare Other | Admitting: Physician Assistant

## 2024-05-01 DIAGNOSIS — Z91199 Patient's noncompliance with other medical treatment and regimen due to unspecified reason: Secondary | ICD-10-CM

## 2024-05-01 NOTE — Progress Notes (Signed)
 No show

## 2024-05-04 ENCOUNTER — Ambulatory Visit: Admitting: Podiatry

## 2024-05-31 ENCOUNTER — Telehealth: Payer: Self-pay | Admitting: Cardiology

## 2024-05-31 DIAGNOSIS — N289 Disorder of kidney and ureter, unspecified: Secondary | ICD-10-CM

## 2024-05-31 DIAGNOSIS — Z79899 Other long term (current) drug therapy: Secondary | ICD-10-CM

## 2024-05-31 NOTE — Telephone Encounter (Signed)
 Pt c/o medication issue:  1. Name of Medication: sacubitril -valsartan  (ENTRESTO ) 97-103 MG   2. How are you currently taking this medication (dosage and times per day)?   3. Are you having a reaction (difficulty breathing--STAT)?   4. What is your medication issue? Patient called stating his PCP just DX him to stage 4 chronic kidney disease.  He wants to know if medication needs to be adjusted.

## 2024-06-01 ENCOUNTER — Telehealth: Payer: Self-pay | Admitting: Family Medicine

## 2024-06-01 NOTE — Telephone Encounter (Signed)
  [name], Michael, [facility] Pharmacist Pharmacy   Telephone Encounter Signed   Creation Time: 06/01/2024 12:39 PM   Signed     Serum creatinine increased from 1.39 on 05/25/23 to 2.63 on 04/18/24.  Recommend rechecking BMP w/GFR first           S/w patient and given the information above. Order placed for BMP. Pt verbalized understanding and will get labs drawn at [facility]. Informed that we will contact him with results.

## 2024-06-01 NOTE — Telephone Encounter (Unsigned)
 Copied from CRM #[id number]. Topic: Referral - Question >> May 31, 2024  3:40 PM Robinson H wrote: Reason for CRM: Patient called following up on referral for Nephrology, advised patient of information on referral and patient states she needs something closer he can't go all the way to [facility], please reach out.  [place] [phone]

## 2024-06-01 NOTE — Telephone Encounter (Signed)
 Serum creatinine increased from 1.39 on 05/25/23 to 2.63 on 04/18/24.  Recommend rechecking BMP w/GFR first

## 2024-06-12 DIAGNOSIS — N289 Disorder of kidney and ureter, unspecified: Secondary | ICD-10-CM | POA: Diagnosis not present

## 2024-06-12 DIAGNOSIS — Z79899 Other long term (current) drug therapy: Secondary | ICD-10-CM | POA: Diagnosis not present

## 2024-06-13 LAB — BASIC METABOLIC PANEL WITH GFR
BUN/Creatinine Ratio: 13 (ref 10–24)
BUN: 28 mg/dL — ABNORMAL HIGH (ref 8–27)
CO2: 21 mmol/L (ref 20–29)
Calcium: 8.4 mg/dL — ABNORMAL LOW (ref 8.6–10.2)
Chloride: 99 mmol/L (ref 96–106)
Creatinine, Ser: 2.2 mg/dL — ABNORMAL HIGH (ref 0.76–1.27)
Glucose: 117 mg/dL — ABNORMAL HIGH (ref 70–99)
Potassium: 5.5 mmol/L — ABNORMAL HIGH (ref 3.5–5.2)
Sodium: 132 mmol/L — ABNORMAL LOW (ref 134–144)
eGFR: 33 mL/min/1.73 — ABNORMAL LOW (ref >59)

## 2024-06-16 ENCOUNTER — Ambulatory Visit: Admitting: Podiatry

## 2024-06-16 DIAGNOSIS — B351 Tinea unguium: Secondary | ICD-10-CM

## 2024-06-16 DIAGNOSIS — E1142 Type 2 diabetes mellitus with diabetic polyneuropathy: Secondary | ICD-10-CM

## 2024-06-16 DIAGNOSIS — M79676 Pain in unspecified toe(s): Secondary | ICD-10-CM

## 2024-06-19 ENCOUNTER — Other Ambulatory Visit: Payer: Self-pay | Admitting: Cardiology

## 2024-06-19 ENCOUNTER — Other Ambulatory Visit: Payer: Self-pay | Admitting: Physician Assistant

## 2024-06-19 DIAGNOSIS — I5042 Chronic combined systolic (congestive) and diastolic (congestive) heart failure: Secondary | ICD-10-CM

## 2024-06-20 ENCOUNTER — Other Ambulatory Visit: Admitting: Pharmacist

## 2024-06-20 ENCOUNTER — Encounter: Payer: Self-pay | Admitting: Pharmacist

## 2024-06-20 NOTE — Progress Notes (Signed)
 06/20/2024 Name: [name]  MRN: [id number] DOB: 05/19/1961  Chief Complaint  Patient presents with   Medication Management    [name]  is a 63 y.o. year old male who presented for a telephone visit.   They were referred to the pharmacist by a quality report for assistance in managing medication access.    Subjective:  Care Team: Primary Care Provider: [name], MD ; Next Scheduled Visit: 10/23/2024 Podiatrist: Dr May: Next Scheduled Visit: 09/18/2024  Medication Access/Adherence  Current Pharmacy:  [facility] - [place], [place] - [address] [place] [address] Phone: [phone] Fax: [phone]  [id number] - [place], [place] - [address] [place] [place] [address] Phone: [phone] Fax: [phone]  [id number] [place], [place] - [address] [place] [address] Phone: [phone] Fax: [phone]   Patient reports affordability concerns with their medications: Yes  - cost of Entresto . Patient reports access/transportation concerns to their pharmacy: No  Patient reports adherence concerns with their medications:  Yes  - due to cost    Objective:  Lab Results  Component Value Date   HGBA1C 6.2 04/18/2024    Lab Results  Component Value Date   CREATININE 2.20 (H) 06/12/2024   BUN 28 (H) 06/12/2024   NA 132 (L) 06/12/2024   K 5.5 (H) 06/12/2024   CL 99 06/12/2024   CO2 21 06/12/2024    Lab Results  Component Value Date   CHOL 99 05/25/2023   HDL 56.20 05/25/2023   LDLCALC 31 05/25/2023   LDLDIRECT 30.0 04/18/2024   TRIG 62.0 05/25/2023   CHOLHDL 2 05/25/2023    Medications Reviewed Today     Reviewed by Carla Milling, RPH-CPP (Pharmacist) on 06/20/24 at 1556  Med List Status: <None>   Medication Order Taking? Sig Documenting Provider Last Dose Status Informant  acetaminophen  (TYLENOL ) 325 MG tablet [id number]   Take 2 tablets (650 mg total) by mouth every 6 (six) hours as needed for mild pain (or Fever >/= 101). Onita Rush, MD  Active Spouse/Significant Other  albuterol  (VENTOLIN  HFA) 108 [phone] Base) MCG/ACT inhaler [id number]  INHALE 2 PUFFS BY MOUTH EVERY 4 HOURS AS NEEDED FOR WHEEZE OR FOR SHORTNESS OF BREATH [name], MD  Active   ALPRAZolam  (XANAX ) 0.5 MG tablet [id number]  Take 1 tablet (0.5 mg total) by mouth 2 (two) times daily as needed for anxiety. Rhys Verneita ONEIDA DEVONNA  Active   aspirin  81 MG chewable tablet [id number]  Chew 1 tablet (81 mg total) by mouth daily. Onita Rush, MD  Active Spouse/Significant Other  Blood Glucose Monitoring Suppl (FREESTYLE LITE) w/Device KIT [id number]  Use to test blood sugars daily. Dx: E11.9 [name], MD  Active   carvedilol  (COREG ) 12.5 MG tablet [id number]  Take 1 tablet (12.5 mg total) by mouth 2 (two) times daily. Jerilynn Lamarr HERO, NP  Active   empagliflozin  (JARDIANCE ) 25 MG TABS tablet [id number]  Take 1 tablet (25 mg total) by mouth daily before breakfast. [name], MD  Active   fluticasone  furoate-vilanterol (BREO ELLIPTA ) 200-25 MCG/ACT AEPB [id number]  Inhale 1 puff into the lungs daily. [name], MD  Active   glucose blood (FREESTYLE LITE) test strip [id number]  USE 1 STRIP TO CHECK GLUCOSE ONCE DAILY. DX: E11.9 [name], MD  Active   Lancets (FREESTYLE) lancets [id number]  USE 1  TO CHECK GLUCOSE ONCE DAILY. Dx:  E11.9 [name], MD  Active   Multiple Vitamin (MULTIVITAMIN) tablet [id number]  Take 1 tablet by mouth daily. [provider]  Active Spouse/Significant Other  NON FORMULARY [id number]  1 each by Other route See admin instructions. Use CPAP machine nightly.  Patient not taking: Reported on 04/18/2024   [provider]  Active Spouse/Significant Other  omeprazole  (PRILOSEC) 40 MG capsule [id number]  TAKE 1 CAPSULE (40 MG TOTAL) BY MOUTH DAILY. OFFICE VISIT FOR FURTHER REFILLS [name], MD  Active   rosuvastatin  (CRESTOR ) 10 MG tablet [id number]  Take 1 tablet (10 mg total) by mouth daily. [name], MD  Active   sacubitril -valsartan  (ENTRESTO ) 97-103 MG [id number]  TAKE 1 TABLET BY MOUTH TWICE A DAY Pietro Redell RAMAN, MD  Active   spironolactone  (ALDACTONE ) 25 MG tablet [id number]  Take 0.5 tablets (12.5 mg total) by mouth daily. Jerilynn Lamarr HERO, NP  Active   tiZANidine  (ZANAFLEX ) 2 MG tablet [id number]  Take 1 tablet (2 mg total) by mouth every 8 (eight) hours as needed for muscle spasms (in low back. do not drive for 8 hours after use). [name], MD  Active   traZODone  (DESYREL ) 100 MG tablet [id number]  Take 1-2 tablets (100-200 mg total) by mouth at bedtime as needed. Rhys Verneita ONEIDA DEVONNA  Active               Card No.: [id number] BIN: [id number] PCN: [id number] PC Group: [id number]   Assessment/Plan:   Medication Management / Access - Patient approved for for [facility] - should cover all medication cost for cardiomyopathy / CHF including Entresto . He states he is still taking Jardiance  but I don't see that a prescription was sent in recently and [facility]. He has a recent elevated Scr. Will check with Dr Katrinka about Jardiance  Rx.  - [facility]ided [facility] with [facility] processing information above. - [facility]ed [facility] for [facility] Entresto  Rx.    Follow Up Plan: 2 to 4 weeks.   [name], PharmD Clinical Pharmacist [facility]  Population Health [phone]

## 2024-06-21 MED ORDER — EMPAGLIFLOZIN 25 MG PO TABS: 25.0000 mg | ORAL_TABLET | Freq: Every day | ORAL | 0 refills | Status: DC

## 2024-06-21 NOTE — Addendum Note (Signed)
 Addended by: CARLA MILLING B on: 06/21/2024 11:28 AM   Modules accepted: Orders

## 2024-06-24 ENCOUNTER — Encounter: Payer: Self-pay | Admitting: Podiatry

## 2024-06-24 NOTE — Progress Notes (Signed)
  Subjective:  Patient ID: [name] , male    DOB: 17-Jul-1961,  MRN: [id number]  [name]  presents to clinic today for at risk foot care with history of diabetic neuropathy and painful mycotic toenails x 10 which interfere with daily activities. Pain is relieved with periodic professional debridement.  Chief Complaint  Patient presents with   Toe Pain    A1c 6.2 at last visit with Dr. Katrinka in Sept 2024 per patient   New problem(s): None.   PCP is [name], MD.  Allergies  Allergen Reactions   Shellfish Allergy Nausea And Vomiting   Buspar  [Buspirone ] Other (See Comments)    headache    Review of Systems: Negative except as noted in the HPI.  Objective: No changes noted in today's physical examination. There were no vitals filed for this visit. [name]  is a pleasant 63 y.o. male obese in NAD. AAO x 3.  Neurovascular status intact bilaterally and symmetrically.  Pedal integument with normal turgor, texture and tone b/l LE. No open wounds b/l. No interdigital macerations b/l. Toenails 2-5 bilaterally and R hallux elongated, thickened, discolored with subungual debris. +Tenderness with dorsal palpation of nailplates. No hyperkeratotic or porokeratotic lesions present.  Muscle strength 5/5 to all lower extremity muscle groups bilaterally. No pain, crepitus or joint limitation noted with ROM bilateral LE.  Assessment/Plan: 1. Pain due to onychomycosis of toenail   2. Diabetic polyneuropathy associated with type 2 diabetes mellitus [facility])   Consent given for treatment. Patient examined. All patient's and/or POA's questions/concerns addressed on today's visit. Mycotic toenails 1-5 b/l  debrided in length and girth without incident. Continue foot and shoe inspections daily. Monitor blood glucose per PCP/Endocrinologist's recommendations.Continue soft, supportive shoe gear daily. Report any pedal injuries to medical professional. Call office if there are  any quesitons/concerns.  No follow-ups on file.  [name], DPM      [facility] LOCATION: [address] [place] [address]                   Office [phone]   [facility] LOCATION: [address] [place], [place] [address] Office [phone]

## 2024-07-04 DIAGNOSIS — N179 Acute kidney failure, unspecified: Secondary | ICD-10-CM | POA: Diagnosis not present

## 2024-07-04 DIAGNOSIS — N184 Chronic kidney disease, stage 4 (severe): Secondary | ICD-10-CM | POA: Diagnosis not present

## 2024-07-04 DIAGNOSIS — R809 Proteinuria, unspecified: Secondary | ICD-10-CM | POA: Diagnosis not present

## 2024-07-04 DIAGNOSIS — I5020 Unspecified systolic (congestive) heart failure: Secondary | ICD-10-CM | POA: Diagnosis not present

## 2024-07-04 DIAGNOSIS — E669 Obesity, unspecified: Secondary | ICD-10-CM | POA: Diagnosis not present

## 2024-07-04 DIAGNOSIS — D649 Anemia, unspecified: Secondary | ICD-10-CM | POA: Diagnosis not present

## 2024-07-04 DIAGNOSIS — N183 Chronic kidney disease, stage 3 unspecified: Secondary | ICD-10-CM | POA: Diagnosis not present

## 2024-07-04 DIAGNOSIS — I129 Hypertensive chronic kidney disease with stage 1 through stage 4 chronic kidney disease, or unspecified chronic kidney disease: Secondary | ICD-10-CM | POA: Diagnosis not present

## 2024-07-04 DIAGNOSIS — E875 Hyperkalemia: Secondary | ICD-10-CM | POA: Diagnosis not present

## 2024-07-05 ENCOUNTER — Other Ambulatory Visit: Payer: Self-pay | Admitting: Internal Medicine

## 2024-07-05 ENCOUNTER — Encounter: Payer: Self-pay | Admitting: Internal Medicine

## 2024-07-05 DIAGNOSIS — N183 Chronic kidney disease, stage 3 unspecified: Secondary | ICD-10-CM

## 2024-07-07 ENCOUNTER — Other Ambulatory Visit: Payer: Self-pay | Admitting: Family Medicine

## 2024-07-07 ENCOUNTER — Telehealth: Admitting: Physician Assistant

## 2024-07-07 ENCOUNTER — Other Ambulatory Visit: Payer: Self-pay | Admitting: Pharmacist

## 2024-07-07 NOTE — Progress Notes (Signed)
 07/07/2024 Name: [name]  MRN: [id number] DOB: July 15, 1961  Chief Complaint  Patient presents with   Medication Management    [name]  is a 63 y.o. year old male who presented for a telephone visit.   They were referred to the pharmacist by a quality report for assistance in managing medication access.    Subjective:  Care Team: Primary Care Provider: [name], MD ; Next Scheduled Visit: 10/23/2024 Podiatrist: Dr May: Next Scheduled Visit: 09/18/2024  Medication Access/Adherence  Current Pharmacy:  [facility] - [place], [place] - [address] [place] [address] Phone: [phone] Fax: [phone]  [id number] [place], [place] - [address] [place] [address] Phone: [phone] Fax: [phone]  [id number] [place], [place] - [address] [place] [address] Phone: [phone] Fax: [phone]   Patient reports affordability concerns with their medications: Yes  - cost of Entresto  and Jardiance  were high and patient was not taking but last week we applied for [employer] grant. Patient has restarted both medications.  Patient reports access/transportation concerns to their pharmacy: No  Patient reports adherence concerns with their medications:  No  - improved     Objective:  Lab Results  Component Value Date   HGBA1C 6.2 04/18/2024    Lab Results  Component Value Date   CREATININE 2.20 (H) 06/12/2024   BUN 28 (H) 06/12/2024   NA 132 (L) 06/12/2024   K 5.5 (H) 06/12/2024   CL 99 06/12/2024   CO2 21 06/12/2024    Lab Results  Component Value Date   CHOL 99 05/25/2023   HDL 56.20 05/25/2023   LDLCALC 31 05/25/2023   LDLDIRECT 30.0 04/18/2024   TRIG 62.0 05/25/2023   CHOLHDL 2 05/25/2023    Medications Reviewed Today     Reviewed by Carla Milling, RPH-CPP (Pharmacist) on 07/07/24 at 1404  Med List Status: <None>    Medication Order Taking? Sig Documenting Provider Last Dose Status Informant  acetaminophen  (TYLENOL ) 325 MG tablet [id number]  Take 2 tablets (650 mg total) by mouth every 6 (six) hours as needed for mild pain (or Fever >/= 101). Onita Rush, MD  Active Spouse/Significant Other  albuterol  (VENTOLIN  HFA) 108 [phone] Base) MCG/ACT inhaler [id number]  INHALE 2 PUFFS BY MOUTH EVERY 4 HOURS AS NEEDED FOR WHEEZE OR FOR SHORTNESS OF BREATH [name], MD  Active   ALPRAZolam  (XANAX ) 0.5 MG tablet [id number]  Take 1 tablet (0.5 mg total) by mouth 2 (two) times daily as needed for anxiety. [name]  Active   aspirin  81 MG chewable tablet [id number]  Chew 1 tablet (81 mg total) by mouth daily. Onita Rush, MD  Active Spouse/Significant Other  Blood Glucose Monitoring Suppl (FREESTYLE LITE) w/Device KIT [id number]  Use to test blood sugars daily. Dx: E11.9 [name], MD  Active   carvedilol  (COREG ) 12.5 MG tablet [id number]  Take 1 tablet (12.5 mg total) by mouth 2 (two) times daily. Michael Lamarr CHRISTELLA, NP  Active   empagliflozin  (JARDIANCE ) 25 MG TABS tablet [id number]  Take 1 tablet (25 mg total) by mouth daily before breakfast. [name], MD  Active   fluticasone  furoate-vilanterol (BREO ELLIPTA ) 200-25 MCG/ACT AEPB [id number]  Inhale 1 puff into the lungs daily. [name], MD  Active   glucose blood (FREESTYLE LITE) test strip [id number]  USE 1 STRIP TO CHECK GLUCOSE ONCE DAILY. DX: E11.9  [name], MD  Active   Lancets (FREESTYLE) lancets [id number]  USE 1  TO CHECK GLUCOSE ONCE DAILY. Dx: E11.9 [name], MD  Active   Multiple Vitamin (MULTIVITAMIN) tablet [id number]  Take 1 tablet by mouth daily. [provider]  Active Spouse/Significant Other  NON FORMULARY [id number]  1 each by Other route See admin instructions. Use CPAP machine nightly.  Patient not taking: Reported on 04/18/2024   [provider]  Active Spouse/Significant Other   omeprazole  (PRILOSEC) 40 MG capsule [id number]  TAKE 1 CAPSULE (40 MG TOTAL) BY MOUTH DAILY. OFFICE VISIT FOR FURTHER REFILLS [name], MD  Active   rosuvastatin  (CRESTOR ) 10 MG tablet [id number]  Take 1 tablet (10 mg total) by mouth daily. [name], MD  Active   sacubitril -valsartan  (ENTRESTO ) 97-103 MG [id number]  TAKE 1 TABLET BY MOUTH TWICE A DAY Crenshaw, Redell RAMAN, MD  Active   spironolactone  (ALDACTONE ) 25 MG tablet [id number]  Take 0.5 tablets (12.5 mg total) by mouth daily. Michael Lamarr HERO, NP  Active   tiZANidine  (ZANAFLEX ) 2 MG tablet [id number]  Take 1 tablet (2 mg total) by mouth every 8 (eight) hours as needed for muscle spasms (in low back. do not drive for 8 hours after use). [name], MD  Active   traZODone  (DESYREL ) 100 MG tablet [id number]  Take 1-2 tablets (100-200 mg total) by mouth at bedtime as needed. [name]  Active               Card No.: [id number] BIN: [id number] PCN: [id number] PC Group: [id number]   Assessment/Plan:   Medication Management / Access - Patient approved for for [facility] - should cover all medication cost for cardiomyopathy / CHF including Entresto  and Jardiance .  -Reviewed meds and refill history. Patient is due to refill the following - carvedilol , spironolactone . His wife states she will request refills. Reminded rosuvastatin  will also be due soon. He has refills remaining in all these medications.    Follow Up Plan: 2 months  [name], PharmD Clinical Pharmacist [facility]  Population Health [phone]

## 2024-07-14 ENCOUNTER — Ambulatory Visit
Admission: RE | Admit: 2024-07-14 | Discharge: 2024-07-14 | Disposition: A | Discharge status: Home / Self Care | Source: Ambulatory Visit | Attending: Internal Medicine | Admitting: Internal Medicine

## 2024-07-14 DIAGNOSIS — N183 Chronic kidney disease, stage 3 unspecified: Secondary | ICD-10-CM

## 2024-07-18 DIAGNOSIS — N183 Chronic kidney disease, stage 3 unspecified: Secondary | ICD-10-CM | POA: Diagnosis not present

## 2024-07-20 DIAGNOSIS — N186 End stage renal disease: Secondary | ICD-10-CM | POA: Diagnosis not present

## 2024-07-20 DIAGNOSIS — N183 Chronic kidney disease, stage 3 unspecified: Secondary | ICD-10-CM | POA: Diagnosis not present

## 2024-08-02 DIAGNOSIS — E875 Hyperkalemia: Secondary | ICD-10-CM | POA: Diagnosis not present

## 2024-08-02 DIAGNOSIS — N183 Chronic kidney disease, stage 3 unspecified: Secondary | ICD-10-CM | POA: Diagnosis not present

## 2024-08-02 DIAGNOSIS — R809 Proteinuria, unspecified: Secondary | ICD-10-CM | POA: Diagnosis not present

## 2024-08-02 DIAGNOSIS — I5020 Unspecified systolic (congestive) heart failure: Secondary | ICD-10-CM | POA: Diagnosis not present

## 2024-08-02 DIAGNOSIS — D649 Anemia, unspecified: Secondary | ICD-10-CM | POA: Diagnosis not present

## 2024-08-02 DIAGNOSIS — N179 Acute kidney failure, unspecified: Secondary | ICD-10-CM | POA: Diagnosis not present

## 2024-08-02 DIAGNOSIS — I129 Hypertensive chronic kidney disease with stage 1 through stage 4 chronic kidney disease, or unspecified chronic kidney disease: Secondary | ICD-10-CM | POA: Diagnosis not present

## 2024-08-02 DIAGNOSIS — E669 Obesity, unspecified: Secondary | ICD-10-CM | POA: Diagnosis not present

## 2024-08-04 ENCOUNTER — Encounter (HOSPITAL_COMMUNITY): Payer: Self-pay | Admitting: Internal Medicine

## 2024-08-04 DIAGNOSIS — N1832 Chronic kidney disease, stage 3b: Secondary | ICD-10-CM

## 2024-08-04 LAB — LAB REPORT - SCANNED
Creatinine, POC: 77.4 mg/dL
EGFR: 41

## 2024-08-07 ENCOUNTER — Other Ambulatory Visit (HOSPITAL_COMMUNITY): Payer: Self-pay | Admitting: Internal Medicine

## 2024-08-07 DIAGNOSIS — E875 Hyperkalemia: Secondary | ICD-10-CM

## 2024-08-07 DIAGNOSIS — N186 End stage renal disease: Secondary | ICD-10-CM

## 2024-08-07 DIAGNOSIS — N183 Chronic kidney disease, stage 3 unspecified: Secondary | ICD-10-CM

## 2024-08-07 DIAGNOSIS — R809 Proteinuria, unspecified: Secondary | ICD-10-CM

## 2024-08-07 DIAGNOSIS — I129 Hypertensive chronic kidney disease with stage 1 through stage 4 chronic kidney disease, or unspecified chronic kidney disease: Secondary | ICD-10-CM

## 2024-08-07 DIAGNOSIS — I5020 Unspecified systolic (congestive) heart failure: Secondary | ICD-10-CM

## 2024-08-07 DIAGNOSIS — N179 Acute kidney failure, unspecified: Secondary | ICD-10-CM

## 2024-08-07 DIAGNOSIS — F411 Generalized anxiety disorder: Secondary | ICD-10-CM

## 2024-08-07 DIAGNOSIS — E1122 Type 2 diabetes mellitus with diabetic chronic kidney disease: Secondary | ICD-10-CM

## 2024-08-17 ENCOUNTER — Other Ambulatory Visit: Payer: Self-pay

## 2024-08-17 ENCOUNTER — Inpatient Hospital Stay (HOSPITAL_COMMUNITY)
Admission: EM | Admit: 2024-08-17 | Discharge: 2024-08-23 | DRG: 291 | Disposition: A | Attending: Internal Medicine | Admitting: Internal Medicine

## 2024-08-17 ENCOUNTER — Emergency Department (HOSPITAL_COMMUNITY)

## 2024-08-17 ENCOUNTER — Encounter (HOSPITAL_COMMUNITY): Payer: Self-pay | Admitting: Internal Medicine

## 2024-08-17 ENCOUNTER — Ambulatory Visit: Attending: Physician Assistant | Admitting: Physician Assistant

## 2024-08-17 ENCOUNTER — Encounter: Payer: Self-pay | Admitting: Physician Assistant

## 2024-08-17 VITALS — BP 104/70 | HR 104 | Ht 70.0 in | Wt 300.0 lb

## 2024-08-17 DIAGNOSIS — F32A Depression, unspecified: Secondary | ICD-10-CM | POA: Diagnosis present

## 2024-08-17 DIAGNOSIS — E871 Hypo-osmolality and hyponatremia: Secondary | ICD-10-CM

## 2024-08-17 DIAGNOSIS — I130 Hypertensive heart and chronic kidney disease with heart failure and stage 1 through stage 4 chronic kidney disease, or unspecified chronic kidney disease: Principal | ICD-10-CM | POA: Diagnosis present

## 2024-08-17 DIAGNOSIS — E119 Type 2 diabetes mellitus without complications: Secondary | ICD-10-CM | POA: Diagnosis not present

## 2024-08-17 DIAGNOSIS — I428 Other cardiomyopathies: Secondary | ICD-10-CM | POA: Diagnosis present

## 2024-08-17 DIAGNOSIS — N183 Chronic kidney disease, stage 3 unspecified: Secondary | ICD-10-CM | POA: Diagnosis not present

## 2024-08-17 DIAGNOSIS — Z8709 Personal history of other diseases of the respiratory system: Secondary | ICD-10-CM

## 2024-08-17 DIAGNOSIS — I5043 Acute on chronic combined systolic (congestive) and diastolic (congestive) heart failure: Secondary | ICD-10-CM

## 2024-08-17 DIAGNOSIS — N1832 Chronic kidney disease, stage 3b: Secondary | ICD-10-CM | POA: Diagnosis present

## 2024-08-17 DIAGNOSIS — G4700 Insomnia, unspecified: Secondary | ICD-10-CM | POA: Diagnosis present

## 2024-08-17 DIAGNOSIS — I959 Hypotension, unspecified: Secondary | ICD-10-CM | POA: Diagnosis present

## 2024-08-17 DIAGNOSIS — I10 Essential (primary) hypertension: Secondary | ICD-10-CM | POA: Insufficient documentation

## 2024-08-17 DIAGNOSIS — G4730 Sleep apnea, unspecified: Secondary | ICD-10-CM | POA: Diagnosis not present

## 2024-08-17 DIAGNOSIS — I4891 Unspecified atrial fibrillation: Secondary | ICD-10-CM | POA: Insufficient documentation

## 2024-08-17 DIAGNOSIS — E7849 Other hyperlipidemia: Secondary | ICD-10-CM | POA: Diagnosis present

## 2024-08-17 DIAGNOSIS — I517 Cardiomegaly: Secondary | ICD-10-CM | POA: Diagnosis not present

## 2024-08-17 DIAGNOSIS — T501X6A Underdosing of loop [high-ceiling] diuretics, initial encounter: Secondary | ICD-10-CM | POA: Diagnosis present

## 2024-08-17 DIAGNOSIS — I480 Paroxysmal atrial fibrillation: Secondary | ICD-10-CM | POA: Diagnosis not present

## 2024-08-17 DIAGNOSIS — N289 Disorder of kidney and ureter, unspecified: Secondary | ICD-10-CM | POA: Insufficient documentation

## 2024-08-17 DIAGNOSIS — Z860102 Personal history of hyperplastic colon polyps: Secondary | ICD-10-CM

## 2024-08-17 DIAGNOSIS — Z6841 Body Mass Index (BMI) 40.0 and over, adult: Secondary | ICD-10-CM

## 2024-08-17 DIAGNOSIS — E8809 Other disorders of plasma-protein metabolism, not elsewhere classified: Secondary | ICD-10-CM | POA: Diagnosis present

## 2024-08-17 DIAGNOSIS — Z7984 Long term (current) use of oral hypoglycemic drugs: Secondary | ICD-10-CM | POA: Diagnosis not present

## 2024-08-17 DIAGNOSIS — Y92009 Unspecified place in unspecified non-institutional (private) residence as the place of occurrence of the external cause: Secondary | ICD-10-CM

## 2024-08-17 DIAGNOSIS — Z8249 Family history of ischemic heart disease and other diseases of the circulatory system: Secondary | ICD-10-CM | POA: Diagnosis not present

## 2024-08-17 DIAGNOSIS — Z825 Family history of asthma and other chronic lower respiratory diseases: Secondary | ICD-10-CM

## 2024-08-17 DIAGNOSIS — E1122 Type 2 diabetes mellitus with diabetic chronic kidney disease: Secondary | ICD-10-CM | POA: Diagnosis present

## 2024-08-17 DIAGNOSIS — Z888 Allergy status to other drugs, medicaments and biological substances status: Secondary | ICD-10-CM

## 2024-08-17 DIAGNOSIS — I440 Atrioventricular block, first degree: Secondary | ICD-10-CM | POA: Diagnosis present

## 2024-08-17 DIAGNOSIS — I110 Hypertensive heart disease with heart failure: Secondary | ICD-10-CM | POA: Diagnosis not present

## 2024-08-17 DIAGNOSIS — Z8419 Family history of other disorders of kidney and ureter: Secondary | ICD-10-CM

## 2024-08-17 DIAGNOSIS — Z79899 Other long term (current) drug therapy: Secondary | ICD-10-CM

## 2024-08-17 DIAGNOSIS — I5023 Acute on chronic systolic (congestive) heart failure: Secondary | ICD-10-CM | POA: Insufficient documentation

## 2024-08-17 DIAGNOSIS — I509 Heart failure, unspecified: Secondary | ICD-10-CM

## 2024-08-17 DIAGNOSIS — J4489 Other specified chronic obstructive pulmonary disease: Secondary | ICD-10-CM | POA: Diagnosis present

## 2024-08-17 DIAGNOSIS — Z87441 Personal history of nephrotic syndrome: Secondary | ICD-10-CM

## 2024-08-17 DIAGNOSIS — K219 Gastro-esophageal reflux disease without esophagitis: Secondary | ICD-10-CM | POA: Diagnosis present

## 2024-08-17 DIAGNOSIS — F411 Generalized anxiety disorder: Secondary | ICD-10-CM | POA: Diagnosis not present

## 2024-08-17 DIAGNOSIS — Z7901 Long term (current) use of anticoagulants: Secondary | ICD-10-CM

## 2024-08-17 DIAGNOSIS — T7819XA Other adverse food reactions, not elsewhere classified, initial encounter: Secondary | ICD-10-CM | POA: Diagnosis present

## 2024-08-17 DIAGNOSIS — E785 Hyperlipidemia, unspecified: Secondary | ICD-10-CM | POA: Diagnosis not present

## 2024-08-17 DIAGNOSIS — Z87891 Personal history of nicotine dependence: Secondary | ICD-10-CM

## 2024-08-17 DIAGNOSIS — Z136 Encounter for screening for cardiovascular disorders: Secondary | ICD-10-CM | POA: Insufficient documentation

## 2024-08-17 DIAGNOSIS — Z833 Family history of diabetes mellitus: Secondary | ICD-10-CM

## 2024-08-17 DIAGNOSIS — E1169 Type 2 diabetes mellitus with other specified complication: Secondary | ICD-10-CM | POA: Diagnosis present

## 2024-08-17 DIAGNOSIS — Z59868 Other specified financial insecurity: Secondary | ICD-10-CM

## 2024-08-17 DIAGNOSIS — Z7982 Long term (current) use of aspirin: Secondary | ICD-10-CM

## 2024-08-17 DIAGNOSIS — G4733 Obstructive sleep apnea (adult) (pediatric): Secondary | ICD-10-CM | POA: Diagnosis present

## 2024-08-17 DIAGNOSIS — I5031 Acute diastolic (congestive) heart failure: Secondary | ICD-10-CM | POA: Diagnosis not present

## 2024-08-17 DIAGNOSIS — I483 Typical atrial flutter: Secondary | ICD-10-CM | POA: Diagnosis not present

## 2024-08-17 DIAGNOSIS — D631 Anemia in chronic kidney disease: Secondary | ICD-10-CM | POA: Diagnosis present

## 2024-08-17 DIAGNOSIS — Z91128 Patient's intentional underdosing of medication regimen for other reason: Secondary | ICD-10-CM

## 2024-08-17 DIAGNOSIS — I4892 Unspecified atrial flutter: Secondary | ICD-10-CM | POA: Diagnosis present

## 2024-08-17 LAB — CBC WITH DIFFERENTIAL/PLATELET
Abs Immature Granulocytes: 0.03 K/uL (ref 0.00–0.07)
Basophils Absolute: 0.1 K/uL (ref 0.0–0.1)
Basophils Relative: 1 %
Eosinophils Absolute: 0.2 K/uL (ref 0.0–0.5)
Eosinophils Relative: 3 %
HCT: 40.2 % (ref 39.0–52.0)
Hemoglobin: 13.8 g/dL (ref 13.0–17.0)
Immature Granulocytes: 0 %
Lymphocytes Relative: 17 %
Lymphs Abs: 1.2 K/uL (ref 0.7–4.0)
MCH: 32 pg (ref 26.0–34.0)
MCHC: 34.3 g/dL (ref 30.0–36.0)
MCV: 93.3 fL (ref 80.0–100.0)
Monocytes Absolute: 0.7 K/uL (ref 0.1–1.0)
Monocytes Relative: 9 %
Neutro Abs: 5.3 K/uL (ref 1.7–7.7)
Neutrophils Relative %: 70 %
Platelets: 270 K/uL (ref 150–400)
RBC: 4.31 MIL/uL (ref 4.22–5.81)
RDW: 12.9 % (ref 11.5–15.5)
WBC: 7.5 K/uL (ref 4.0–10.5)
nRBC: 0 % (ref 0.0–0.2)

## 2024-08-17 LAB — COMPREHENSIVE METABOLIC PANEL WITH GFR
ALT: 20 U/L (ref 0–44)
AST: 25 U/L (ref 15–41)
Albumin: <1.5 g/dL — ABNORMAL LOW (ref 3.5–5.0)
Alkaline Phosphatase: 82 U/L (ref 38–126)
Anion gap: 9 (ref 5–15)
BUN: 31 mg/dL — ABNORMAL HIGH (ref 8–23)
CO2: 22 mmol/L (ref 22–32)
Calcium: 7.8 mg/dL — ABNORMAL LOW (ref 8.9–10.3)
Chloride: 95 mmol/L — ABNORMAL LOW (ref 98–111)
Creatinine, Ser: 2.2 mg/dL — ABNORMAL HIGH (ref 0.61–1.24)
GFR, Estimated: 33 mL/min — ABNORMAL LOW (ref >60)
Glucose, Bld: 83 mg/dL (ref 70–99)
Potassium: 4.8 mmol/L (ref 3.5–5.1)
Sodium: 126 mmol/L — ABNORMAL LOW (ref 135–145)
Total Bilirubin: 0.5 mg/dL (ref 0.0–1.2)
Total Protein: 5.1 g/dL — ABNORMAL LOW (ref 6.5–8.1)

## 2024-08-17 LAB — TROPONIN I (HIGH SENSITIVITY)
Troponin I (High Sensitivity): 3 ng/L (ref <18)
Troponin I (High Sensitivity): <2 ng/L (ref <18)

## 2024-08-17 LAB — PROTIME-INR
INR: 1 (ref 0.8–1.2)
Prothrombin Time: 13.7 s (ref 11.4–15.2)

## 2024-08-17 LAB — BRAIN NATRIURETIC PEPTIDE: B Natriuretic Peptide: 113.3 pg/mL — ABNORMAL HIGH (ref 0.0–100.0)

## 2024-08-17 MED ORDER — ALBUMIN HUMAN 25 % IV SOLN
12.5000 g | Freq: Once | INTRAVENOUS | Status: AC
  Administered 2024-08-17: 12.5 g via INTRAVENOUS
  Filled 2024-08-17: qty 50

## 2024-08-17 MED ORDER — HEPARIN BOLUS VIA INFUSION
4000.0000 [IU] | Freq: Once | INTRAVENOUS | Status: AC
  Administered 2024-08-18: 4000 [IU] via INTRAVENOUS
  Filled 2024-08-17: qty 4000

## 2024-08-17 MED ORDER — FUROSEMIDE 10 MG/ML IJ SOLN
20.0000 mg | Freq: Once | INTRAMUSCULAR | Status: AC
  Administered 2024-08-17: 20 mg via INTRAVENOUS
  Filled 2024-08-17: qty 2

## 2024-08-17 MED ORDER — ASPIRIN 81 MG PO CHEW
81.0000 mg | CHEWABLE_TABLET | Freq: Every day | ORAL | Status: DC
  Administered 2024-08-18 – 2024-08-20 (×3): 81 mg via ORAL
  Filled 2024-08-17 (×3): qty 1

## 2024-08-17 MED ORDER — SODIUM CHLORIDE 0.9% FLUSH: 3.0000 mL | INTRAVENOUS | Status: DC | PRN

## 2024-08-17 MED ORDER — ALBUMIN HUMAN 25 % IV SOLN
12.5000 g | INTRAVENOUS | Status: AC
  Administered 2024-08-18: 12.5 g via INTRAVENOUS
  Filled 2024-08-17: qty 50

## 2024-08-17 MED ORDER — EMPAGLIFLOZIN 25 MG PO TABS
25.0000 mg | ORAL_TABLET | Freq: Every day | ORAL | Status: DC
  Administered 2024-08-18 – 2024-08-23 (×5): 25 mg via ORAL
  Filled 2024-08-17 (×6): qty 1

## 2024-08-17 MED ORDER — SODIUM CHLORIDE 0.9% FLUSH
3.0000 mL | Freq: Two times a day (BID) | INTRAVENOUS | Status: DC
  Administered 2024-08-18 – 2024-08-23 (×11): 3 mL via INTRAVENOUS

## 2024-08-17 MED ORDER — ONDANSETRON HCL 4 MG PO TABS: 4.0000 mg | ORAL_TABLET | Freq: Four times a day (QID) | ORAL | Status: DC | PRN

## 2024-08-17 MED ORDER — INSULIN ASPART 100 UNIT/ML IJ SOLN: 0.0000 [IU] | Freq: Three times a day (TID) | INTRAMUSCULAR | Status: DC | PRN

## 2024-08-17 MED ORDER — TRAZODONE HCL 50 MG PO TABS
100.0000 mg | ORAL_TABLET | Freq: Every evening | ORAL | Status: DC | PRN
  Administered 2024-08-18 – 2024-08-22 (×5): 100 mg via ORAL
  Filled 2024-08-17 (×5): qty 2

## 2024-08-17 MED ORDER — ROSUVASTATIN CALCIUM 5 MG PO TABS
10.0000 mg | ORAL_TABLET | Freq: Every day | ORAL | Status: DC
  Administered 2024-08-18 – 2024-08-23 (×6): 10 mg via ORAL
  Filled 2024-08-17 (×6): qty 2

## 2024-08-17 MED ORDER — FUROSEMIDE 10 MG/ML IJ SOLN
20.0000 mg | Freq: Two times a day (BID) | INTRAMUSCULAR | Status: DC
  Administered 2024-08-18: 20 mg via INTRAVENOUS
  Filled 2024-08-17: qty 2

## 2024-08-17 MED ORDER — SODIUM CHLORIDE 0.9% FLUSH
3.0000 mL | Freq: Two times a day (BID) | INTRAVENOUS | Status: DC
  Administered 2024-08-18 – 2024-08-23 (×11): 3 mL via INTRAVENOUS

## 2024-08-17 MED ORDER — PANTOPRAZOLE SODIUM 40 MG PO TBEC
40.0000 mg | DELAYED_RELEASE_TABLET | Freq: Every day | ORAL | Status: DC
  Administered 2024-08-18 – 2024-08-23 (×6): 40 mg via ORAL
  Filled 2024-08-17 (×6): qty 1

## 2024-08-17 MED ORDER — INSULIN ASPART 100 UNIT/ML IJ SOLN: 0.0000 [IU] | Freq: Every day | INTRAMUSCULAR | Status: DC

## 2024-08-17 MED ORDER — SODIUM CHLORIDE 0.9 % IV SOLN: 250.0000 mL | INTRAVENOUS | Status: AC | PRN

## 2024-08-17 MED ORDER — FLUTICASONE FUROATE-VILANTEROL 200-25 MCG/ACT IN AEPB
1.0000 | INHALATION_SPRAY | Freq: Every day | RESPIRATORY_TRACT | Status: DC
  Administered 2024-08-20 – 2024-08-23 (×3): 1 via RESPIRATORY_TRACT
  Filled 2024-08-17: qty 28

## 2024-08-17 MED ORDER — ONDANSETRON HCL 4 MG/2ML IJ SOLN: 4.0000 mg | Freq: Four times a day (QID) | INTRAMUSCULAR | Status: DC | PRN

## 2024-08-17 MED ORDER — ALPRAZOLAM 0.5 MG PO TABS: 0.5000 mg | ORAL_TABLET | Freq: Two times a day (BID) | ORAL | Status: DC | PRN

## 2024-08-17 MED ORDER — IPRATROPIUM-ALBUTEROL 0.5-2.5 (3) MG/3ML IN SOLN: 3.0000 mL | Freq: Four times a day (QID) | RESPIRATORY_TRACT | Status: DC | PRN

## 2024-08-17 MED ORDER — ACETAMINOPHEN 650 MG RE SUPP: 650.0000 mg | Freq: Four times a day (QID) | RECTAL | Status: DC | PRN

## 2024-08-17 MED ORDER — HEPARIN (PORCINE) 25000 UT/250ML-% IV SOLN
1450.0000 [IU]/h | INTRAVENOUS | Status: DC
  Administered 2024-08-18: 1900 [IU]/h via INTRAVENOUS
  Administered 2024-08-18: 1600 [IU]/h via INTRAVENOUS
  Administered 2024-08-19: 1900 [IU]/h via INTRAVENOUS
  Administered 2024-08-19: 1500 [IU]/h via INTRAVENOUS
  Administered 2024-08-20: 1400 [IU]/h via INTRAVENOUS
  Administered 2024-08-21: 13:00:00 1450 [IU]/h via INTRAVENOUS
  Filled 2024-08-17 (×6): qty 250

## 2024-08-17 MED ORDER — FUROSEMIDE 10 MG/ML IJ SOLN: 40.0000 mg | Freq: Once | INTRAMUSCULAR | Status: DC

## 2024-08-17 MED ORDER — ALBUTEROL SULFATE (2.5 MG/3ML) 0.083% IN NEBU: 3.0000 mL | INHALATION_SOLUTION | Freq: Four times a day (QID) | RESPIRATORY_TRACT | Status: DC | PRN

## 2024-08-17 MED ORDER — ACETAMINOPHEN 325 MG PO TABS: 650.0000 mg | ORAL_TABLET | Freq: Four times a day (QID) | ORAL | Status: DC | PRN

## 2024-08-17 MED ORDER — CARVEDILOL 3.125 MG PO TABS
6.2500 mg | ORAL_TABLET | Freq: Two times a day (BID) | ORAL | Status: DC
  Administered 2024-08-18: 6.25 mg via ORAL
  Filled 2024-08-17: qty 2

## 2024-08-17 NOTE — ED Triage Notes (Signed)
 See Quick Triage note.  Pt c/o increasing generalized swelling since October worsening over the last week.  Pt reports increased fatigue.  Pt reports restarting Lasix  recently.

## 2024-08-17 NOTE — ED Triage Notes (Addendum)
 Quick triage note: pt to ED via POV from cards office , pt expected, this RN spoke with cards provider prior to pt arrival, pt has new onset a fib rate 104 and concern for fluid overload and not responding to outpatient diuresis. Pt denies CP, SHOB.
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
## 2024-08-17 NOTE — Patient Instructions (Signed)
 Medication Instructions:  Your physician recommends that you continue on your current medications as directed. Please refer to the Current Medication list given to you today.  *If you need a refill on your cardiac medications before your next appointment, please call your pharmacy*  Lab Work: NONE ordered at this time of appointment   Testing/Procedures: NONE ordered at this time of appointment   Follow-Up: At [facility], you and your health needs are our priority.  As part of our continuing mission to provide you with exceptional heart care, our providers are all part of one team.  This team includes your primary Cardiologist (physician) and Advanced Practice Providers or APPs (Physician Assistants and Nurse Practitioners) who all work together to provide you with the care you need, when you need it.  Your next appointment:    Per hospital recommendations   We recommend signing up for the patient portal called MyChart.  Sign up information is provided on this After Visit Summary.  MyChart is used to connect with patients for Virtual Visits (Telemedicine).  Patients are able to view lab/test results, encounter notes, upcoming appointments, etc.  Non-urgent messages can be sent to your provider as well.   To learn more about what you can do with MyChart, go to [email or web address].   Other Instructions Proceed to ED

## 2024-08-17 NOTE — ED Notes (Signed)
 Unable to obtain 2nd trop times 2

## 2024-08-17 NOTE — ED Provider Triage Note (Signed)
 Emergency Medicine Provider Triage Evaluation Note  [name]  , a 63 y.o. male  was evaluated in triage.  Pt complains of: sent from cardiology office. New onset A-fib in office, h/o CHF, CKD recent change to diuretics by nephrology. 25 pound weight gain in the last 2-3 months. Denies significant SOB. No chest pain.  Review of Systems  Positive: Peripheral edema L>R, and as above Negative: Chest pain, significant SOB  Physical Exam  BP 100/81 (BP Location: Left Arm)   Pulse 78   Temp 97.6 F (36.4 C)   Resp 20   SpO2 100%  Gen:   Awake, no distress   Resp:  Normal effort  MSK:   Moves extremities without difficulty  Other:  LE edema  Medical Decision Making  Medically screening exam initiated at 4:07 PM.  Appropriate orders placed.  [name]  was informed that the remainder of the evaluation will be completed by another provider, this initial triage assessment does not replace that evaluation, and the importance of remaining in the ED until their evaluation is complete.     Odell Balls, PA-C 08/17/24 1609
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
## 2024-08-17 NOTE — ED Provider Notes (Signed)
  EMERGENCY DEPARTMENT AT [facility] Provider Note   CSN: [id number] Arrival date & time: 08/17/24  1458     Patient presents with: Atrial Fibrillation and Leg Swelling   Lyrick Lagrand Sox  is a 63 y.o. male. Hx of hypertensive cardiomyopathy, HFpEF EF 50-55%, cardiac catheterization November 2018 revealed normal coronary arteries but elevated LV end-diastolic pressure of 26 mmHg presenting w/ progressively worsening SOB, new a fib, sent by cards office for failed oupt diuresis.  History per patient, patient and family.  Endorses shortness of breath, worsening lower extremity swelling, and abdominal swelling over the past few days.  Patient is currently on Lasix  20 mg daily, however concerned that patient is mildly hypotensive, and would like to manage diuresis inpatient.  Therefore, patient sent to the ED for further management.  Patient denies chest pain, abdominal pain, nausea, vomiting, fever, chills.  Denies recent illnesses.  Does endorse some left leg pain in comparison to the right, as well as left lower extremity swelling more significant in comparison to the right.  Patient also endorses that he has apparently had A-fib for some time, however has not started blood thinners yet.  {Add pertinent medical, surgical, social history, OB history to YEP:[id number]}  Atrial Fibrillation       Prior to Admission medications  Medication Sig Start Date End Date Taking? Authorizing Provider  acetaminophen  (TYLENOL ) 325 MG tablet Take 2 tablets (650 mg total) by mouth every 6 (six) hours as needed for mild pain (or Fever >/= 101). 11/15/14   Onita Rush, MD  albuterol  (VENTOLIN  HFA) 108 [phone] Base) MCG/ACT inhaler INHALE 2 PUFFS BY MOUTH EVERY 4 HOURS AS NEEDED FOR WHEEZE OR FOR SHORTNESS OF BREATH 03/02/23   [name], MD  ALPRAZolam  (XANAX ) 0.5 MG tablet Take 1 tablet (0.5 mg total) by mouth 2 (two) times daily as needed for anxiety. 11/01/23   Rhys Verneita DASEN, PA-C  aspirin   81 MG chewable tablet Chew 1 tablet (81 mg total) by mouth daily. 11/15/14   Onita Rush, MD  Blood Glucose Monitoring Suppl (FREESTYLE LITE) w/Device KIT Use to test blood sugars daily. Dx: E11.9 05/01/22   [name], MD  carvedilol  (COREG ) 12.5 MG tablet Take 1 tablet (12.5 mg total) by mouth 2 (two) times daily. 10/25/23   Jerilynn Lamarr HERO, NP  empagliflozin  (JARDIANCE ) 25 MG TABS tablet Take 1 tablet (25 mg total) by mouth daily before breakfast. 06/21/24   [name], MD  fluticasone  furoate-vilanterol (BREO ELLIPTA ) 200-25 MCG/ACT AEPB Inhale 1 puff into the lungs daily. 02/17/23   [name], MD  furosemide  (LASIX ) 20 MG tablet Take 20 mg by mouth.    [provider]  glucose blood (FREESTYLE LITE) test strip USE 1 STRIP TO CHECK GLUCOSE ONCE DAILY. DX: E11.9 11/02/22   [name], MD  Lancets (FREESTYLE) lancets USE 1  TO CHECK GLUCOSE ONCE DAILY. Dx: E11.9 05/05/22   [name], MD  Multiple Vitamin (MULTIVITAMIN) tablet Take 1 tablet by mouth daily.    [provider]  NON FORMULARY 1 each by Other route See admin instructions. Use CPAP machine nightly. Patient not taking: Reported on 04/18/2024    [provider]  omeprazole  (PRILOSEC) 40 MG capsule TAKE 1 CAPSULE (40 MG TOTAL) BY MOUTH DAILY. OFFICE VISIT FOR FURTHER REFILLS 07/07/24   [name], MD  rosuvastatin  (CRESTOR ) 10 MG tablet Take 1 tablet (10 mg total) by mouth daily. 04/24/24   Katrinka Garnette  O, MD  sacubitril -valsartan  (ENTRESTO ) 97-103 MG TAKE 1 TABLET BY MOUTH TWICE A DAY 06/22/24   Pietro Redell RAMAN, MD  spironolactone  (ALDACTONE ) 25 MG tablet Take 0.5 tablets (12.5 mg total) by mouth daily. Patient not taking: Reported on 08/17/2024 10/25/23   Jerilynn Lamarr HERO, NP  tiZANidine  (ZANAFLEX ) 2 MG tablet Take 1 tablet (2 mg total) by mouth every 8 (eight) hours as needed for muscle spasms (in low back. do not drive for 8 hours after use). 05/25/23   [name], MD  traZODone  (DESYREL ) 100 MG tablet Take 1-2 tablets (100-200 mg total) by mouth at bedtime as needed. 11/01/23   Rhys Verneita DASEN, PA-C    Allergies: Shellfish allergy and Buspar  [buspirone ]    Review of Systems  Updated Vital Signs BP 98/67 (BP Location: Right Arm)   Pulse 73   Temp (!) 97.5 F (36.4 C)   Resp 13   SpO2 100%   Physical Exam Vitals and nursing note reviewed.  Constitutional:      General: He is not in acute distress.    Appearance: He is well-developed. He is not ill-appearing.  HENT:     Head: Normocephalic and atraumatic.     Mouth/Throat:     Mouth: Mucous membranes are moist.     Pharynx: Oropharynx is clear.  Eyes:     Conjunctiva/sclera: Conjunctivae normal.  Cardiovascular:     Rate and Rhythm: Normal rate. Rhythm irregular.     Heart sounds: No murmur heard. Pulmonary:     Effort: Pulmonary effort is normal. No respiratory distress.     Breath sounds: Normal breath sounds. No stridor. No wheezing, rhonchi or rales.  Abdominal:     General: Abdomen is flat. There is distension.     Palpations: Abdomen is soft.  Musculoskeletal:        General: No swelling.     Cervical back: Neck supple. No rigidity.     Right lower leg: Edema present.     Left lower leg: Edema present.     Comments: 3-4+ pitting edema bilateral lower extremities, left significantly worse in comparison to the right.  Mildly tender to palpation.  Skin:    General: Skin is warm and dry.     Capillary Refill: Capillary refill takes 2 to 3 seconds.  Neurological:     General: No focal deficit present.     Mental Status: He is alert and oriented to person, place, and time.  Psychiatric:        Mood and Affect: Mood normal.     (all labs ordered are listed, but only abnormal results are displayed) Labs Reviewed  COMPREHENSIVE METABOLIC PANEL WITH GFR - Abnormal; Notable for the following components:      Result Value   Sodium 126 (*)    Chloride 95 (*)    BUN  31 (*)    Creatinine, Ser 2.20 (*)    Calcium  7.8 (*)    Total Protein 5.1 (*)    Albumin <1.5 (*)    GFR, Estimated 33 (*)    All other components within normal limits  BRAIN NATRIURETIC PEPTIDE - Abnormal; Notable for the following components:   B Natriuretic Peptide 113.3 (*)    All other components within normal limits  CBC WITH DIFFERENTIAL/PLATELET  PROTIME-INR  TROPONIN I (HIGH SENSITIVITY)  TROPONIN I (HIGH SENSITIVITY)    EKG: None  Radiology: DG Chest 2 View Result Date: 08/17/2024 CLINICAL DATA:  Generalized swelling EXAM: CHEST -  2 VIEW COMPARISON:  02/18/2024 FINDINGS: The heart size and mediastinal contours are within normal limits. Both lungs are clear. Degenerative changes of the spine. IMPRESSION: No active cardiopulmonary disease. Electronically Signed   By: Luke Bun M.D.   On: 08/17/2024 17:04    {Document cardiac monitor, telemetry assessment procedure when appropriate:32947} Procedures   Medications Ordered in the ED - No data to display  Clinical Course as of 08/17/24 2219  Thu Aug 17, 2024  2213 Modified geneva of 9, w/ R>L swelling w/ tachy w/ a fib, SOB, c/f PE. Will scan.  [BS]    Clinical Course User Index [BS] Arlee Katz, MD   {Click here for ABCD2, HEART and other calculators REFRESH Note before signing:1}                              Medical Decision Making Amount and/or Complexity of Data Reviewed Radiology: ordered.  Risk Prescription drug management.   ***  {Document critical care time when appropriate  Document review of labs and clinical decision tools ie CHADS2VASC2, etc  Document your independent review of radiology images and any outside records  Document your discussion with family members, caretakers and with consultants  Document social determinants of health affecting pt's care  Document your decision making why or why not admission, treatments were needed:32947:::1}   Final diagnoses:  None    ED  Discharge Orders     None

## 2024-08-17 NOTE — Progress Notes (Signed)
 PHARMACY - ANTICOAGULATION CONSULT NOTE  Pharmacy Consult for Heparin  Indication: atrial fibrillation  Allergies[1]  Patient Measurements: Height: 5' 10 (177.8 cm) Weight: (!) 136.1 kg (300 lb 0.7 oz) IBW/kg (Calculated) : 73 HEPARIN  DW (KG): 104.7  Vital Signs: Temp: 97.7 F (36.5 C) (12/11 2344) Temp Source: Oral (12/11 2344) BP: 107/73 (12/11 2344) Pulse Rate: 79 (12/11 2344)  Labs: Recent Labs    08/17/24 1603 08/17/24 1940  HGB 13.8  --   HCT 40.2  --   PLT 270  --   LABPROT 13.7  --   INR 1.0  --   CREATININE 2.20*  --   TROPONINIHS 3 <2    Estimated Creatinine Clearance: 49 mL/min (A) (by C-G formula based on SCr of 2.2 mg/dL (H)).   Medical History: Past Medical History:  Diagnosis Date   Abscess of anal and rectal regions    Acute renal insufficiency    Allergy 2005   Shellfish   Anxiety    Arthritis    Asthma    Cardiac arrhythmia    Chest pain    CHF (congestive heart failure) (HCC)    COPD (chronic obstructive pulmonary disease) (HCC)    Depression    Diabetes mellitus    pre diabetic- no meds   DOE (dyspnea on exertion)    GERD (gastroesophageal reflux disease)    Hemorrhoids, internal    Hyperlipidemia    Hyperplastic colon polyp    Hypertension    IBS (irritable bowel syndrome)    Sleep apnea 2003   BPAP    Medications:  Medications Ordered Prior to Encounter[2]   Assessment: 63 y.o. male with new onset Afib for heparin  Goal of Therapy:  Heparin  level 0.3-0.7 units/ml Monitor platelets by anticoagulation protocol: Yes   Plan:  Heparin  4000 units IV bolus, then start heparin  1600 unit/shr Check heparin  level in 8 hours.   Lesli Issa, Cordella Misty 08/17/2024,11:45 PM      [1]  Allergies Allergen Reactions   Shellfish Allergy Nausea And Vomiting   Buspar  [Buspirone ] Other (See Comments)    headache  [2]  No current facility-administered medications on file prior to encounter.   Current Outpatient Medications on  File Prior to Encounter  Medication Sig Dispense Refill   acetaminophen  (TYLENOL ) 325 MG tablet Take 2 tablets (650 mg total) by mouth every 6 (six) hours as needed for mild pain (or Fever >/= 101).     albuterol  (VENTOLIN  HFA) 108 (90 Base) MCG/ACT inhaler INHALE 2 PUFFS BY MOUTH EVERY 4 HOURS AS NEEDED FOR WHEEZE OR FOR SHORTNESS OF BREATH 8.5 each 3   ALPRAZolam  (XANAX ) 0.5 MG tablet Take 1 tablet (0.5 mg total) by mouth 2 (two) times daily as needed for anxiety. 30 tablet 5   aspirin  81 MG chewable tablet Chew 1 tablet (81 mg total) by mouth daily.     Blood Glucose Monitoring Suppl (FREESTYLE LITE) w/Device KIT Use to test blood sugars daily. Dx: E11.9 1 kit 3   carvedilol  (COREG ) 12.5 MG tablet Take 1 tablet (12.5 mg total) by mouth 2 (two) times daily. 180 tablet 3   empagliflozin  (JARDIANCE ) 25 MG TABS tablet Take 1 tablet (25 mg total) by mouth daily before breakfast. 90 tablet 0   fluticasone  furoate-vilanterol (BREO ELLIPTA ) 200-25 MCG/ACT AEPB Inhale 1 puff into the lungs daily. 90 each 3   furosemide  (LASIX ) 20 MG tablet Take 20 mg by mouth.     glucose blood (FREESTYLE LITE) test strip USE 1 STRIP TO  CHECK GLUCOSE ONCE DAILY. DX: E11.9 100 strip 3   Lancets (FREESTYLE) lancets USE 1  TO CHECK GLUCOSE ONCE DAILY. Dx: E11.9 100 each 0   Multiple Vitamin (MULTIVITAMIN) tablet Take 1 tablet by mouth daily.     NON FORMULARY 1 each by Other route See admin instructions. Use CPAP machine nightly. (Patient not taking: Reported on 04/18/2024)     omeprazole  (PRILOSEC) 40 MG capsule TAKE 1 CAPSULE (40 MG TOTAL) BY MOUTH DAILY. OFFICE VISIT FOR FURTHER REFILLS 90 capsule 0   rosuvastatin  (CRESTOR ) 10 MG tablet Take 1 tablet (10 mg total) by mouth daily. 90 tablet 3   sacubitril -valsartan  (ENTRESTO ) 97-103 MG TAKE 1 TABLET BY MOUTH TWICE A DAY 180 tablet 0   spironolactone  (ALDACTONE ) 25 MG tablet Take 0.5 tablets (12.5 mg total) by mouth daily. (Patient not taking: Reported on 08/17/2024) 45  tablet 3   tiZANidine  (ZANAFLEX ) 2 MG tablet Take 1 tablet (2 mg total) by mouth every 8 (eight) hours as needed for muscle spasms (in low back. do not drive for 8 hours after use). 30 tablet 1   traZODone  (DESYREL ) 100 MG tablet Take 1-2 tablets (100-200 mg total) by mouth at bedtime as needed. 180 tablet 1

## 2024-08-17 NOTE — ED Provider Notes (Signed)
 Patient sent in from cardiology for failing outpatient diuresis and new onset afib.   Hyponatremic.  Thought 2/2 diuresis.   BPs have been a little low, has been on Entresto .  I consulted with Dr. Sundil, who is appreciated for admitting.  I consulted  with Dr. Vonda, cardiology, will have patient seen in the AM.   Vicky Charleston, PA-C 08/18/24 0127    Palumbo, April, MD 08/18/24 0139

## 2024-08-18 ENCOUNTER — Inpatient Hospital Stay (HOSPITAL_COMMUNITY)

## 2024-08-18 DIAGNOSIS — I5031 Acute diastolic (congestive) heart failure: Secondary | ICD-10-CM | POA: Diagnosis not present

## 2024-08-18 DIAGNOSIS — I517 Cardiomegaly: Secondary | ICD-10-CM | POA: Diagnosis not present

## 2024-08-18 DIAGNOSIS — I4891 Unspecified atrial fibrillation: Secondary | ICD-10-CM | POA: Diagnosis not present

## 2024-08-18 DIAGNOSIS — N183 Chronic kidney disease, stage 3 unspecified: Secondary | ICD-10-CM

## 2024-08-18 DIAGNOSIS — I480 Paroxysmal atrial fibrillation: Secondary | ICD-10-CM | POA: Diagnosis not present

## 2024-08-18 LAB — GLUCOSE, CAPILLARY
Glucose-Capillary: 98 mg/dL (ref 70–99)
Glucose-Capillary: 103 mg/dL — ABNORMAL HIGH (ref 70–99)

## 2024-08-18 LAB — BASIC METABOLIC PANEL WITH GFR
Anion gap: 6 (ref 5–15)
Anion gap: 7 (ref 5–15)
Anion gap: 7 (ref 5–15)
Anion gap: 4 — ABNORMAL LOW (ref 5–15)
BUN: 30 mg/dL — ABNORMAL HIGH (ref 8–23)
BUN: 31 mg/dL — ABNORMAL HIGH (ref 8–23)
BUN: 30 mg/dL — ABNORMAL HIGH (ref 8–23)
BUN: 29 mg/dL — ABNORMAL HIGH (ref 8–23)
CO2: 22 mmol/L (ref 22–32)
CO2: 23 mmol/L (ref 22–32)
CO2: 22 mmol/L (ref 22–32)
CO2: 26 mmol/L (ref 22–32)
Calcium: 7.2 mg/dL — ABNORMAL LOW (ref 8.9–10.3)
Calcium: 7.6 mg/dL — ABNORMAL LOW (ref 8.9–10.3)
Calcium: 7.7 mg/dL — ABNORMAL LOW (ref 8.9–10.3)
Calcium: 8.2 mg/dL — ABNORMAL LOW (ref 8.9–10.3)
Chloride: 103 mmol/L (ref 98–111)
Chloride: 100 mmol/L (ref 98–111)
Chloride: 101 mmol/L (ref 98–111)
Chloride: 101 mmol/L (ref 98–111)
Creatinine, Ser: 1.81 mg/dL — ABNORMAL HIGH (ref 0.61–1.24)
Creatinine, Ser: 1.95 mg/dL — ABNORMAL HIGH (ref 0.61–1.24)
Creatinine, Ser: 1.99 mg/dL — ABNORMAL HIGH (ref 0.61–1.24)
Creatinine, Ser: 1.88 mg/dL — ABNORMAL HIGH (ref 0.61–1.24)
GFR, Estimated: 42 mL/min — ABNORMAL LOW (ref >60)
GFR, Estimated: 38 mL/min — ABNORMAL LOW (ref >60)
GFR, Estimated: 37 mL/min — ABNORMAL LOW (ref >60)
GFR, Estimated: 40 mL/min — ABNORMAL LOW (ref >60)
Glucose, Bld: 81 mg/dL (ref 70–99)
Glucose, Bld: 126 mg/dL — ABNORMAL HIGH (ref 70–99)
Glucose, Bld: 99 mg/dL (ref 70–99)
Glucose, Bld: 93 mg/dL (ref 70–99)
Potassium: 4.3 mmol/L (ref 3.5–5.1)
Potassium: 4.1 mmol/L (ref 3.5–5.1)
Potassium: 5.1 mmol/L (ref 3.5–5.1)
Potassium: 4.4 mmol/L (ref 3.5–5.1)
Sodium: 131 mmol/L — ABNORMAL LOW (ref 135–145)
Sodium: 130 mmol/L — ABNORMAL LOW (ref 135–145)
Sodium: 130 mmol/L — ABNORMAL LOW (ref 135–145)
Sodium: 131 mmol/L — ABNORMAL LOW (ref 135–145)

## 2024-08-18 LAB — HEPARIN LEVEL (UNFRACTIONATED)
Heparin Unfractionated: 0.12 [IU]/mL — ABNORMAL LOW (ref 0.30–0.70)
Heparin Unfractionated: 0.58 [IU]/mL (ref 0.30–0.70)

## 2024-08-18 LAB — CBG MONITORING, ED
Glucose-Capillary: 121 mg/dL — ABNORMAL HIGH (ref 70–99)
Glucose-Capillary: 76 mg/dL (ref 70–99)

## 2024-08-18 LAB — CBC
HCT: 35.9 % — ABNORMAL LOW (ref 39.0–52.0)
Hemoglobin: 12.4 g/dL — ABNORMAL LOW (ref 13.0–17.0)
MCH: 32.2 pg (ref 26.0–34.0)
MCHC: 34.5 g/dL (ref 30.0–36.0)
MCV: 93.2 fL (ref 80.0–100.0)
Platelets: 252 K/uL (ref 150–400)
RBC: 3.85 MIL/uL — ABNORMAL LOW (ref 4.22–5.81)
RDW: 13.1 % (ref 11.5–15.5)
WBC: 7.4 K/uL (ref 4.0–10.5)
nRBC: 0 % (ref 0.0–0.2)

## 2024-08-18 LAB — HEPATIC FUNCTION PANEL
ALT: 16 U/L (ref 0–44)
AST: 20 U/L (ref 15–41)
Albumin: 1.5 g/dL — ABNORMAL LOW (ref 3.5–5.0)
Alkaline Phosphatase: 68 U/L (ref 38–126)
Bilirubin, Direct: 0.2 mg/dL (ref 0.0–0.2)
Indirect Bilirubin: 0.3 mg/dL (ref 0.3–0.9)
Total Bilirubin: 0.5 mg/dL (ref 0.0–1.2)
Total Protein: 4.6 g/dL — ABNORMAL LOW (ref 6.5–8.1)

## 2024-08-18 LAB — OSMOLALITY: Osmolality: 288 mosm/kg (ref 275–295)

## 2024-08-18 LAB — ECHOCARDIOGRAM COMPLETE
Calc EF: 48.8 %
Est EF: 55
Height: 70 in
S' Lateral: 4.2 cm
Single Plane A2C EF: 43.1 %
Single Plane A4C EF: 53.7 %
Weight: 4800.74 [oz_av]

## 2024-08-18 LAB — SODIUM, URINE, RANDOM: Sodium, Ur: <30 mmol/L

## 2024-08-18 LAB — TSH: TSH: 1.766 u[IU]/mL (ref 0.350–4.500)

## 2024-08-18 LAB — HIV ANTIBODY (ROUTINE TESTING W REFLEX): HIV Screen 4th Generation wRfx: NONREACTIVE

## 2024-08-18 LAB — MRSA NEXT GEN BY PCR, NASAL: MRSA by PCR Next Gen: NOT DETECTED

## 2024-08-18 LAB — BRAIN NATRIURETIC PEPTIDE: B Natriuretic Peptide: 63.1 pg/mL (ref 0.0–100.0)

## 2024-08-18 MED ORDER — IOHEXOL 350 MG/ML SOLN
75.0000 mL | Freq: Once | INTRAVENOUS | Status: AC | PRN
  Administered 2024-08-18: 75 mL via INTRAVENOUS

## 2024-08-18 MED ORDER — HEPARIN BOLUS VIA INFUSION
3000.0000 [IU] | Freq: Once | INTRAVENOUS | Status: AC
  Administered 2024-08-18: 3000 [IU] via INTRAVENOUS
  Filled 2024-08-18: qty 3000

## 2024-08-18 MED ORDER — METOPROLOL TARTRATE 25 MG PO TABS
25.0000 mg | ORAL_TABLET | Freq: Two times a day (BID) | ORAL | Status: DC
  Administered 2024-08-18 – 2024-08-23 (×10): 25 mg via ORAL
  Filled 2024-08-18 (×10): qty 1

## 2024-08-18 MED ORDER — FUROSEMIDE 10 MG/ML IJ SOLN
40.0000 mg | Freq: Two times a day (BID) | INTRAMUSCULAR | Status: DC
  Administered 2024-08-18 – 2024-08-22 (×8): 40 mg via INTRAVENOUS
  Filled 2024-08-18 (×8): qty 4

## 2024-08-18 NOTE — Progress Notes (Signed)
 Progress Note   Patient: [name]  FMW:[id number] DOB: 10-15-1960 DOA: 08/17/2024     1 DOS: the patient was seen and examined on 08/18/2024   Brief hospital course:  [name]  is a 63 y.o. 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  Patient was referred to emergency department from cardiology clinic as new onset of atrial fibrillation and concern for acute on chronic CHF exacerbation.  Cardiology consulted  Assessment and Plan: Atrial flutter and New onset of atrial fibrillation rate controlled CHA2DS2-VASc score of 3 Continue telemetry monitoring Currently rate controlled Continue Lopressor Currently on heparin  drip Cardiology is following we appreciate input     Acute on chronic CHF exacerbation History of combined CHF with reduced EF 35 to 40% Essential hypertension -Noncompliance with oral Lasix  and diet.  Volume overloaded.  Low sodium 126.  Flat troponin.   Chest x-ray showing findings of pulmonary vascular congestion Continue IV Lasix  Monitor input and output as well as daily wean Continue metoprolol Follow-up on echocardiogram Cardiology on board and we appreciate input Cardiac diet     Hypervolemic hyponatremia -Low sodium 126.  Hypervolemic hyponatremia due to noncompliance with Lasix .   Continue Lasix  Monitor electrolytes closely   CKD 3 A/3B - Creatinine 2.02.  Baseline creatinine around 2.2-2.6 over the course of last 4 months however over the course of last 1 year creatinine has been progressively worsening and patient renal function has been progressed.   Patient has significant proteinuria per outpatient workup and presently spironolactone  on hold as outpatient nephrology plan for renal biopsy 12/23. -Continue to monitor renal  function     Non-insulin -dependent DM type II -Continue Farxiga and sliding scale insulin  with meals as needed.   History of COPD -Continue DuoNeb as needed and Breo Ellipta  once daily   Hyperlipidemia - Continue Crestor    Obstructive sleep apnea-on CPAP - Continue CPAP at bedtime and  consulted respiratory therapy   Insomnia - Continue trazodone  as needed at bedtime   Generalized anxiety disorder - Continue Xanax  0.5 mg bid prn    DVT prophylaxis:  IV heparin  gtts, SCD and TED hose  Code Status:  Full Code Diet: Heart healthy carb modified diet  Family Communication:   Family was present at bedside, at the time of interview. opportunity was given to ask question and all questions were answered satisfactorily.  Disposition Plan: Need to follow-up with cardiology recommendation, echocardiogram result, TSH and BNP level. Consults: Cardiology and respiratory care Admission status:   Inpatient, Step Down Unit   Subjective:  Patient seen and examined at bedside this morning Denies chest pain nausea vomiting abdominal pain  Physical Exam:  General: He is not in acute distress.    Appearance: He is obese. He is not ill-appearing.  HENT:     Mouth/Throat:     Mouth: Mucous membranes are moist.  Eyes:     Pupils: Pupils are equal, round, and reactive to light.  Cardiovascular:     Rate and Rhythm: Normal rate. Rhythm irregular.     Pulses: Normal pulses.     Heart sounds: Normal heart sounds.  Pulmonary:     Effort: Pulmonary effort is normal.     Breath sounds: Normal breath sounds.  Abdominal:     Palpations: Abdomen is soft.  Musculoskeletal:        General: Swelling present.  Cervical back: Neck supple.     Right lower leg: Edema present.     Left lower leg: Edema present.  Skin:    Capillary Refill: Capillary refill takes less than 2 seconds.  Neurological:     Mental Status: He is alert and oriented to person, place, and time.  Psychiatric:        Mood  and Affect: Mood normal.     Vitals:   08/18/24 0730 08/18/24 0732 08/18/24 1057 08/18/24 1133  BP:   108/64 90/72  Pulse:   (!) 107 88  Resp:    14  Temp:  97.8 F (36.6 C)  97.8 F (36.6 C)  TempSrc:  Oral  Oral  SpO2: 100%   100%  Weight:      Height:        Data Reviewed: Chest x-ray showing findings of pulmonary vascular congestion    Latest Ref Rng & Units 08/18/2024    3:07 AM 08/17/2024    4:03 PM 04/18/2024    4:16 PM  CBC  WBC 4.0 - 10.5 K/uL 7.4  7.5  6.3   Hemoglobin 13.0 - 17.0 g/dL 87.5  86.1  87.3   Hematocrit 39.0 - 52.0 % 35.9  40.2  37.7   Platelets 150 - 400 K/uL 252  270  186.0        Latest Ref Rng & Units 08/18/2024    9:20 AM 08/18/2024    4:45 AM 08/17/2024    4:03 PM  BMP  Glucose 70 - 99 mg/dL 873  81  83   BUN 8 - 23 mg/dL 31  30  31    Creatinine 0.61 - 1.24 mg/dL 8.04  8.18  7.79   Sodium 135 - 145 mmol/L 130  131  126   Potassium 3.5 - 5.1 mmol/L 4.1  4.3  4.8   Chloride 98 - 111 mmol/L 100  103  95   CO2 22 - 32 mmol/L 23  22  22    Calcium  8.9 - 10.3 mg/dL 7.6  7.2  7.8      Family Communication: Discussed with patient's wife at bedside   Time spent: 50 minutes  Author: Drue ONEIDA Potter, MD 08/18/2024 1:39 PM  For on call review [email or web address].

## 2024-08-18 NOTE — ED Notes (Signed)
 Albumin stopped per ER MD orders
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
## 2024-08-18 NOTE — ED Notes (Signed)
 Pt to CT

## 2024-08-18 NOTE — ED Notes (Signed)
 Heparin  meds pending IV start, IV team consult ordered

## 2024-08-18 NOTE — Progress Notes (Incomplete)
*  PRELIMINARY RESULTS* Echocardiogram 2D Echocardiogram has been performed.  [name] 08/18/2024, 8:46 AM

## 2024-08-18 NOTE — Progress Notes (Signed)
 PHARMACY - ANTICOAGULATION CONSULT NOTE  Pharmacy Consult for Heparin  Indication: atrial fibrillation  Allergies[1]  Patient Measurements: Height: 5' 10 (177.8 cm) Weight: 133.9 kg (295 lb 3.1 oz) IBW/kg (Calculated) : 73 HEPARIN  DW (KG): 104  Vital Signs: Temp: 97.8 F (36.6 C) (12/12 1920) Temp Source: Oral (12/12 1920) BP: 126/94 (12/12 1920) Pulse Rate: 68 (12/12 1920)  Labs: Recent Labs    08/17/24 1603 08/17/24 1940 08/18/24 0307 08/18/24 0445 08/18/24 0920 08/18/24 1752 08/18/24 2057  HGB 13.8  --  12.4*  --   --   --   --   HCT 40.2  --  35.9*  --   --   --   --   PLT 270  --  252  --   --   --   --   LABPROT 13.7  --   --   --   --   --   --   INR 1.0  --   --   --   --   --   --   HEPARINUNFRC  --   --   --   --  0.12*  --  0.58  CREATININE 2.20*  --   --    < > 1.95* 1.99* 1.88*  TROPONINIHS 3 <2  --   --   --   --   --    < > = values in this interval not displayed.    Estimated Creatinine Clearance: 56.8 mL/min (A) (by C-G formula based on SCr of 1.88 mg/dL (H)).   Medical History: Past Medical History:  Diagnosis Date   Abscess of anal and rectal regions    Acute renal insufficiency    Allergy 2005   Shellfish   Anxiety    Arthritis    Asthma    Cardiac arrhythmia    Chest pain    CHF (congestive heart failure) (HCC)    COPD (chronic obstructive pulmonary disease) (HCC)    Depression    Diabetes mellitus    pre diabetic- no meds   DOE (dyspnea on exertion)    GERD (gastroesophageal reflux disease)    Hemorrhoids, internal    Hyperlipidemia    Hyperplastic colon polyp    Hypertension    IBS (irritable bowel syndrome)    Sleep apnea 2003   BPAP    Medications:  Medications Ordered Prior to Encounter[2]   Assessment: 63 y.o. male with new onset Afib for heparin .   Heparin  level therapeutic at 0.58 after recent dose increase to 1900 units/hr.   Goal of Therapy:  Heparin  level 0.3-0.7 units/ml Monitor platelets by  anticoagulation protocol: Yes   Plan:  Continue heparin  1900 unit/shr Check confirmatory heparin  level in 6 hrs  Rankin Sams, PharmD, BCPS, BCCCP Clinical Pharmacist        [1]  Allergies Allergen Reactions   Shellfish Allergy Nausea And Vomiting   Buspar  [Buspirone ] Other (See Comments)    headache  [2]  No current facility-administered medications on file prior to encounter.   Current Outpatient Medications on File Prior to Encounter  Medication Sig Dispense Refill   acetaminophen  (TYLENOL ) 650 MG CR tablet Take 1,300 mg by mouth every 8 (eight) hours as needed for pain (chronic knee pain).     albuterol  (VENTOLIN  HFA) 108 (90 Base) MCG/ACT inhaler INHALE 2 PUFFS BY MOUTH EVERY 4 HOURS AS NEEDED FOR WHEEZE OR FOR SHORTNESS OF BREATH 8.5 each 3   ALPRAZolam  (XANAX ) 0.5 MG tablet Take 1 tablet (0.5  mg total) by mouth 2 (two) times daily as needed for anxiety. 30 tablet 5   aspirin  EC 81 MG tablet Take 81 mg by mouth in the morning. Swallow whole.     carvedilol  (COREG ) 12.5 MG tablet Take 1 tablet (12.5 mg total) by mouth 2 (two) times daily. (Patient taking differently: Take 12.5 mg by mouth in the morning.) 180 tablet 3   empagliflozin  (JARDIANCE ) 25 MG TABS tablet Take 1 tablet (25 mg total) by mouth daily before breakfast. 90 tablet 0   fluticasone  furoate-vilanterol (BREO ELLIPTA ) 200-25 MCG/ACT AEPB Inhale 1 puff into the lungs daily. 90 each 3   furosemide  (LASIX ) 40 MG tablet Take 40 mg by mouth in the morning.     lidocaine  4 % Place 1 patch onto the skin daily.     Multiple Vitamin (MULTIVITAMIN) tablet Take 1 tablet by mouth daily.     omeprazole  (PRILOSEC) 40 MG capsule TAKE 1 CAPSULE (40 MG TOTAL) BY MOUTH DAILY. OFFICE VISIT FOR FURTHER REFILLS 90 capsule 0   Polyethyl Glyc-Propyl Glyc PF (SYSTANE HYDRATION PF) 0.4-0.3 % SOLN Place 1 drop into both eyes daily as needed (dry eyes, eye irritations).     rosuvastatin  (CRESTOR ) 10 MG tablet Take 1 tablet (10 mg total)  by mouth daily. (Patient taking differently: Take 10 mg by mouth daily after supper.) 90 tablet 3   sacubitril -valsartan  (ENTRESTO ) 97-103 MG TAKE 1 TABLET BY MOUTH TWICE A DAY 180 tablet 0   traZODone  (DESYREL ) 100 MG tablet Take 1-2 tablets (100-200 mg total) by mouth at bedtime as needed. (Patient taking differently: Take 50-200 mg by mouth at bedtime.) 180 tablet 1   [DISCONTINUED] acetaminophen  (TYLENOL ) 325 MG tablet Take 2 tablets (650 mg total) by mouth every 6 (six) hours as needed for mild pain (or Fever >/= 101). (Patient not taking: Reported on 08/18/2024)     [DISCONTINUED] aspirin  81 MG chewable tablet Chew 1 tablet (81 mg total) by mouth daily. (Patient not taking: Reported on 08/18/2024)     [DISCONTINUED] Blood Glucose Monitoring Suppl (FREESTYLE LITE) w/Device KIT Use to test blood sugars daily. Dx: E11.9 1 kit 3   [DISCONTINUED] furosemide  (LASIX ) 20 MG tablet Take 20 mg by mouth. (Patient not taking: Reported on 08/18/2024)     [DISCONTINUED] glucose blood (FREESTYLE LITE) test strip USE 1 STRIP TO CHECK GLUCOSE ONCE DAILY. DX: E11.9 100 strip 3   [DISCONTINUED] Lancets (FREESTYLE) lancets USE 1  TO CHECK GLUCOSE ONCE DAILY. Dx: E11.9 100 each 0   [DISCONTINUED] NON FORMULARY 1 each by Other route See admin instructions. Use CPAP machine nightly. (Patient not taking: Reported on 04/18/2024)     [DISCONTINUED] spironolactone  (ALDACTONE ) 25 MG tablet Take 0.5 tablets (12.5 mg total) by mouth daily. (Patient not taking: No sig reported) 45 tablet 3   [DISCONTINUED] tiZANidine  (ZANAFLEX ) 2 MG tablet Take 1 tablet (2 mg total) by mouth every 8 (eight) hours as needed for muscle spasms (in low back. do not drive for 8 hours after use). (Patient not taking: Reported on 08/18/2024) 30 tablet 1

## 2024-08-18 NOTE — ED Notes (Signed)
 Phlebotomy asked to stick

## 2024-08-18 NOTE — ED Notes (Signed)
 2nd IV not started, charge RN aware. 3rd RN will attempt to start IV access

## 2024-08-18 NOTE — Care Management (Signed)
 Transition of Care [facility]) - Inpatient Brief Assessment   Patient Details  Name: Quadarius Henton Rossner  MRN: [id number] Date of Birth: 06/15/1961  [facility] of Care [facility] CM/SW Contact:    Corean JAYSON Canary, RN Phone Number: 08/18/2024, 4:45 PM   Clinical Narrative:  63 yo with chronic heart failure started lasix  1 week ago without decreasing in swelling. Presented with lower extremity swelling and weight gain. He has a PCP, and is seen by cards and nephrology.  EF is 55%, presented with new onset afib without symptoms  IV diuresis No needs identified at this time   Transition of Care Asessment: Insurance and Status: Insurance coverage has been reviewed Patient has primary care physician: Yes Home environment has been reviewed: Lives with spouse Prior level of function:: Independent Prior/Current Home Services: No current home services Social Drivers of Health Review: SDOH reviewed no interventions necessary Readmission risk has been reviewed: Yes Transition of care needs: no transition of care needs at this time

## 2024-08-18 NOTE — Plan of Care (Signed)

## 2024-08-18 NOTE — ED Notes (Signed)
 ED phlebotomy attempted to stick pt for his admission labs. Attempt unsuccessful.

## 2024-08-18 NOTE — ED Notes (Signed)
 Patient transported to X-ray

## 2024-08-18 NOTE — TOC CM/SW Note (Signed)
 TOC consult received for d/c planning needs. Follow-up to be completed with patient as appropriate.   Merilee Batty, MSN, RN Case Management [phone]

## 2024-08-18 NOTE — Progress Notes (Signed)
 PHARMACY - ANTICOAGULATION CONSULT NOTE  Pharmacy Consult for Heparin  Indication: atrial fibrillation  Allergies[1]  Patient Measurements: Height: 5' 10 (177.8 cm) Weight: (!) 136.1 kg (300 lb 0.7 oz) IBW/kg (Calculated) : 73 HEPARIN  DW (KG): 104.7  Vital Signs: Temp: 97.8 F (36.6 C) (12/12 0732) Temp Source: Oral (12/12 0732) BP: 108/64 (12/12 1057) Pulse Rate: 107 (12/12 1057)  Labs: Recent Labs    08/17/24 1603 08/17/24 1940 08/18/24 0307 08/18/24 0445 08/18/24 0920  HGB 13.8  --  12.4*  --   --   HCT 40.2  --  35.9*  --   --   PLT 270  --  252  --   --   LABPROT 13.7  --   --   --   --   INR 1.0  --   --   --   --   HEPARINUNFRC  --   --   --   --  0.12*  CREATININE 2.20*  --   --  1.81* 1.95*  TROPONINIHS 3 <2  --   --   --     Estimated Creatinine Clearance: 55.3 mL/min (A) (by C-G formula based on SCr of 1.95 mg/dL (H)).   Medical History: Past Medical History:  Diagnosis Date   Abscess of anal and rectal regions    Acute renal insufficiency    Allergy 2005   Shellfish   Anxiety    Arthritis    Asthma    Cardiac arrhythmia    Chest pain    CHF (congestive heart failure) (HCC)    COPD (chronic obstructive pulmonary disease) (HCC)    Depression    Diabetes mellitus    pre diabetic- no meds   DOE (dyspnea on exertion)    GERD (gastroesophageal reflux disease)    Hemorrhoids, internal    Hyperlipidemia    Hyperplastic colon polyp    Hypertension    IBS (irritable bowel syndrome)    Sleep apnea 2003   BPAP    Medications:  Medications Ordered Prior to Encounter[2]   Assessment: 63 y.o. male with new onset Afib for heparin . Heparin  level subtherapeutic (0.12) on infusion at 1600 units/hr.  Goal of Therapy:  Heparin  level 0.3-0.7 units/ml Monitor platelets by anticoagulation protocol: Yes   Plan:  Heparin  4000 units IV bolus, then start heparin  1900 unit/shr Check heparin  level in 8 hrs  Vito Ralph, PharmD, BCPS Please see amion  for complete clinical pharmacist phone list 08/18/2024,11:16 AM       [1]  Allergies Allergen Reactions   Shellfish Allergy Nausea And Vomiting   Buspar  [Buspirone ] Other (See Comments)    headache  [2]  No current facility-administered medications on file prior to encounter.   Current Outpatient Medications on File Prior to Encounter  Medication Sig Dispense Refill   acetaminophen  (TYLENOL ) 650 MG CR tablet Take 1,300 mg by mouth every 8 (eight) hours as needed for pain (chronic knee pain).     albuterol  (VENTOLIN  HFA) 108 (90 Base) MCG/ACT inhaler INHALE 2 PUFFS BY MOUTH EVERY 4 HOURS AS NEEDED FOR WHEEZE OR FOR SHORTNESS OF BREATH 8.5 each 3   ALPRAZolam  (XANAX ) 0.5 MG tablet Take 1 tablet (0.5 mg total) by mouth 2 (two) times daily as needed for anxiety. 30 tablet 5   aspirin  EC 81 MG tablet Take 81 mg by mouth in the morning. Swallow whole.     carvedilol  (COREG ) 12.5 MG tablet Take 1 tablet (12.5 mg total) by mouth 2 (two) times daily. (  Patient taking differently: Take 12.5 mg by mouth in the morning.) 180 tablet 3   empagliflozin  (JARDIANCE ) 25 MG TABS tablet Take 1 tablet (25 mg total) by mouth daily before breakfast. 90 tablet 0   fluticasone  furoate-vilanterol (BREO ELLIPTA ) 200-25 MCG/ACT AEPB Inhale 1 puff into the lungs daily. 90 each 3   furosemide  (LASIX ) 40 MG tablet Take 40 mg by mouth in the morning.     lidocaine  4 % Place 1 patch onto the skin daily.     Multiple Vitamin (MULTIVITAMIN) tablet Take 1 tablet by mouth daily.     omeprazole  (PRILOSEC) 40 MG capsule TAKE 1 CAPSULE (40 MG TOTAL) BY MOUTH DAILY. OFFICE VISIT FOR FURTHER REFILLS 90 capsule 0   Polyethyl Glyc-Propyl Glyc PF (SYSTANE HYDRATION PF) 0.4-0.3 % SOLN Place 1 drop into both eyes daily as needed (dry eyes, eye irritations).     rosuvastatin  (CRESTOR ) 10 MG tablet Take 1 tablet (10 mg total) by mouth daily. (Patient taking differently: Take 10 mg by mouth daily after supper.) 90 tablet 3    sacubitril -valsartan  (ENTRESTO ) 97-103 MG TAKE 1 TABLET BY MOUTH TWICE A DAY 180 tablet 0   traZODone  (DESYREL ) 100 MG tablet Take 1-2 tablets (100-200 mg total) by mouth at bedtime as needed. (Patient taking differently: Take 50-200 mg by mouth at bedtime.) 180 tablet 1   [DISCONTINUED] acetaminophen  (TYLENOL ) 325 MG tablet Take 2 tablets (650 mg total) by mouth every 6 (six) hours as needed for mild pain (or Fever >/= 101). (Patient not taking: Reported on 08/18/2024)     [DISCONTINUED] aspirin  81 MG chewable tablet Chew 1 tablet (81 mg total) by mouth daily. (Patient not taking: Reported on 08/18/2024)     [DISCONTINUED] Blood Glucose Monitoring Suppl (FREESTYLE LITE) w/Device KIT Use to test blood sugars daily. Dx: E11.9 1 kit 3   [DISCONTINUED] furosemide  (LASIX ) 20 MG tablet Take 20 mg by mouth. (Patient not taking: Reported on 08/18/2024)     [DISCONTINUED] glucose blood (FREESTYLE LITE) test strip USE 1 STRIP TO CHECK GLUCOSE ONCE DAILY. DX: E11.9 100 strip 3   [DISCONTINUED] Lancets (FREESTYLE) lancets USE 1  TO CHECK GLUCOSE ONCE DAILY. Dx: E11.9 100 each 0   [DISCONTINUED] NON FORMULARY 1 each by Other route See admin instructions. Use CPAP machine nightly. (Patient not taking: Reported on 04/18/2024)     [DISCONTINUED] spironolactone  (ALDACTONE ) 25 MG tablet Take 0.5 tablets (12.5 mg total) by mouth daily. (Patient not taking: No sig reported) 45 tablet 3   [DISCONTINUED] tiZANidine  (ZANAFLEX ) 2 MG tablet Take 1 tablet (2 mg total) by mouth every 8 (eight) hours as needed for muscle spasms (in low back. do not drive for 8 hours after use). (Patient not taking: Reported on 08/18/2024) 30 tablet 1

## 2024-08-19 DIAGNOSIS — I480 Paroxysmal atrial fibrillation: Secondary | ICD-10-CM | POA: Diagnosis not present

## 2024-08-19 DIAGNOSIS — I483 Typical atrial flutter: Secondary | ICD-10-CM | POA: Diagnosis not present

## 2024-08-19 LAB — BASIC METABOLIC PANEL WITH GFR
Anion gap: 5 (ref 5–15)
BUN: 28 mg/dL — ABNORMAL HIGH (ref 8–23)
CO2: 22 mmol/L (ref 22–32)
Calcium: 8 mg/dL — ABNORMAL LOW (ref 8.9–10.3)
Chloride: 103 mmol/L (ref 98–111)
Creatinine, Ser: 1.94 mg/dL — ABNORMAL HIGH (ref 0.61–1.24)
GFR, Estimated: 39 mL/min — ABNORMAL LOW (ref >60)
Glucose, Bld: 91 mg/dL (ref 70–99)
Potassium: 4.2 mmol/L (ref 3.5–5.1)
Sodium: 130 mmol/L — ABNORMAL LOW (ref 135–145)

## 2024-08-19 LAB — CBC
HCT: 35.6 % — ABNORMAL LOW (ref 39.0–52.0)
Hemoglobin: 12.3 g/dL — ABNORMAL LOW (ref 13.0–17.0)
MCH: 31.9 pg (ref 26.0–34.0)
MCHC: 34.6 g/dL (ref 30.0–36.0)
MCV: 92.5 fL (ref 80.0–100.0)
Platelets: 220 K/uL (ref 150–400)
RBC: 3.85 MIL/uL — ABNORMAL LOW (ref 4.22–5.81)
RDW: 13.3 % (ref 11.5–15.5)
WBC: 7.4 K/uL (ref 4.0–10.5)
nRBC: 0 % (ref 0.0–0.2)

## 2024-08-19 LAB — GLUCOSE, CAPILLARY
Glucose-Capillary: 90 mg/dL (ref 70–99)
Glucose-Capillary: 99 mg/dL (ref 70–99)
Glucose-Capillary: 87 mg/dL (ref 70–99)
Glucose-Capillary: 90 mg/dL (ref 70–99)

## 2024-08-19 LAB — HEPARIN LEVEL (UNFRACTIONATED)
Heparin Unfractionated: 0.8 [IU]/mL — ABNORMAL HIGH (ref 0.30–0.70)
Heparin Unfractionated: 0.88 [IU]/mL — ABNORMAL HIGH (ref 0.30–0.70)

## 2024-08-19 NOTE — Progress Notes (Signed)
 Rounding Note   Patient Name: [name]  Date of Encounter: 08/19/2024  [facility] Cardiologist: Redell Shallow, MD   Subjective No Dyspnea or CP  Scheduled Meds:  aspirin   81 mg Oral Daily   empagliflozin   25 mg Oral QAC breakfast   fluticasone  furoate-vilanterol  1 puff Inhalation Daily   furosemide   40 mg Intravenous BID   insulin  aspart  0-5 Units Subcutaneous QHS   metoprolol  tartrate  25 mg Oral BID   pantoprazole   40 mg Oral Daily   rosuvastatin   10 mg Oral Daily   sodium chloride  flush  3 mL Intravenous Q12H   sodium chloride  flush  3 mL Intravenous Q12H   Continuous Infusions:  heparin  1,900 Units/hr (08/19/24 0500)   PRN Meds: acetaminophen  **OR** acetaminophen , ALPRAZolam , insulin  aspart, ipratropium-albuterol , ondansetron  **OR** ondansetron  (ZOFRAN ) IV, sodium chloride  flush, traZODone    Vital Signs  Vitals:   08/18/24 1714 08/18/24 1920 08/19/24 0140 08/19/24 0559  BP: (!) 132/95 (!) 126/94 97/67   Pulse: 94 68 86 96  Resp: 20 20 20    Temp: 97.7 F (36.5 C) 97.8 F (36.6 C) 97.9 F (36.6 C)   TempSrc: Oral Oral Oral   SpO2: 100% 100% 100% 98%  Weight:    132.2 kg  Height:        Intake/Output Summary (Last 24 hours) at 08/19/2024 0732 Last data filed at 08/19/2024 0500 Gross per 24 hour  Intake 840.35 ml  Output --  Net 840.35 ml      08/19/2024    5:59 AM 08/18/2024    4:44 PM 08/17/2024   11:42 PM  Last 3 Weights  Weight (lbs) 291 lb 6.4 oz 295 lb 3.1 oz 300 lb 0.7 oz  Weight (kg) 132.178 kg 133.9 kg 136.1 kg      Telemetry Atrial flutter with variable conduction- Personally Reviewed   Physical Exam  GEN: No acute distress.   Neck: No JVD Cardiac: irregular Respiratory: Clear to auscultation bilaterally. GI: Soft, nontender, non-distended  MS: trace edema Neuro:  Nonfocal  Psych: Normal affect   Labs High Sensitivity Troponin:   Recent Labs  Lab 08/17/24 1603 08/17/24 1940  TROPONINIHS 3 <2       Chemistry Recent Labs  Lab 08/17/24 1603 08/18/24 0445 08/18/24 0920 08/18/24 1752 08/18/24 2057 08/19/24 0407  NA 126* 131*   < > 130* 131* 130*  K 4.8 4.3   < > 5.1 4.4 4.2  CL 95* 103   < > 101 101 103  CO2 22 22   < > 22 26 22   GLUCOSE 83 81   < > 99 93 91  BUN 31* 30*   < > 30* 29* 28*  CREATININE 2.20* 1.81*   < > 1.99* 1.88* 1.94*  CALCIUM  7.8* 7.2*   < > 7.7* 8.2* 8.0*  PROT 5.1* 4.6*  --   --   --   --   ALBUMIN  <1.5* 1.5*  --   --   --   --   AST 25 20  --   --   --   --   ALT 20 16  --   --   --   --   ALKPHOS 82 68  --   --   --   --   BILITOT 0.5 0.5  --   --   --   --   GFRNONAA 33* 42*   < > 37* 40* 39*  ANIONGAP 9 6   < >  7 4* 5   < > = values in this interval not displayed.     Hematology Recent Labs  Lab 08/17/24 1603 08/18/24 0307 08/19/24 0407  WBC 7.5 7.4 7.4  RBC 4.31 3.85* 3.85*  HGB 13.8 12.4* 12.3*  HCT 40.2 35.9* 35.6*  MCV 93.3 93.2 92.5  MCH 32.0 32.2 31.9  MCHC 34.3 34.5 34.6  RDW 12.9 13.1 13.3  PLT 270 252 220   Thyroid   Recent Labs  Lab 08/17/24 2344  TSH 1.766    BNP Recent Labs  Lab 08/17/24 1601 08/18/24 0307  BNP 113.3* 63.1      Radiology  ECHOCARDIOGRAM COMPLETE Result Date: 08/18/2024    ECHOCARDIOGRAM REPORT   Patient Name:   [name]  Date of Exam: 08/18/2024 Medical Rec #:  [id number]         Height:       70.0 in Accession #:    [id number]        Weight:       300.0 lb Date of Birth:  1960/09/13         BSA:          2.480 m Patient Age:    63 years          BP:           108/64 mmHg Patient Gender: M                 HR:           130 bpm. Exam Location:  Inpatient Procedure: 2D Echo, Cardiac Doppler and Color Doppler (Both Spectral and Color            Flow Doppler were utilized during procedure). Indications:    CHF I50.31, Afib I48.91  History:        Patient has prior history of Echocardiogram examinations, most                 recent 03/06/2024. CHF, COPD; Risk Factors:Dyslipidemia and                  Hypertension.  Sonographer:    Tinnie Gosling RDCS Referring Phys: [phone] SUBRINA SUNDIL IMPRESSIONS  1. Left ventricular ejection fraction, by estimation, is 55%. The left ventricle has normal function. The left ventricle has no regional wall motion abnormalities. There is mild concentric left ventricular hypertrophy. Left ventricular diastolic parameters are indeterminate.  2. Right ventricular systolic function is normal. The right ventricular size is normal. Tricuspid regurgitation signal is inadequate for assessing PA pressure.  3. Left atrial size was mildly dilated.  4. Right atrial size was mildly dilated.  5. The mitral valve is normal in structure. No evidence of mitral valve regurgitation. No evidence of mitral stenosis.  6. The aortic valve is tricuspid. Aortic valve regurgitation is not visualized. No aortic stenosis is present.  7. The inferior vena cava is normal in size with greater than 50% respiratory variability, suggesting right atrial pressure of 3 mmHg.  8. The patient was in atrial fibrillation. FINDINGS  Left Ventricle: Left ventricular ejection fraction, by estimation, is 55%. The left ventricle has normal function. The left ventricle has no regional wall motion abnormalities. The left ventricular internal cavity size was normal in size. There is mild concentric left ventricular hypertrophy. Left ventricular diastolic parameters are indeterminate. Right Ventricle: The right ventricular size is normal. No increase in right ventricular wall thickness. Right ventricular systolic function is normal. Tricuspid regurgitation signal is inadequate for assessing  PA pressure. Left Atrium: Left atrial size was mildly dilated. Right Atrium: Right atrial size was mildly dilated. Pericardium: There is no evidence of pericardial effusion. Mitral Valve: The mitral valve is normal in structure. No evidence of mitral valve regurgitation. No evidence of mitral valve stenosis. Tricuspid Valve: The tricuspid  valve is normal in structure. Tricuspid valve regurgitation is not demonstrated. Aortic Valve: The aortic valve is tricuspid. Aortic valve regurgitation is not visualized. No aortic stenosis is present. Pulmonic Valve: The pulmonic valve was normal in structure. Pulmonic valve regurgitation is not visualized. Aorta: The aortic root is normal in size and structure. Venous: The inferior vena cava is normal in size with greater than 50% respiratory variability, suggesting right atrial pressure of 3 mmHg. IAS/Shunts: No atrial level shunt detected by color flow Doppler.  LEFT VENTRICLE PLAX 2D LVIDd:         5.40 cm LVIDs:         4.20 cm LV PW:         1.10 cm LV IVS:        1.10 cm LVOT diam:     2.50 cm LV SV:         62 LV SV Index:   25 LVOT Area:     4.91 cm  LV Volumes (MOD) LV vol d, MOD A2C: 90.2 ml LV vol d, MOD A4C: 104.0 ml LV vol s, MOD A2C: 51.3 ml LV vol s, MOD A4C: 48.2 ml LV SV MOD A2C:     38.9 ml LV SV MOD A4C:     104.0 ml LV SV MOD BP:      48.9 ml RIGHT VENTRICLE             IVC RV S prime:     13.40 cm/s  IVC diam: 1.40 cm TAPSE (M-mode): 1.8 cm LEFT ATRIUM             Index        RIGHT ATRIUM           Index LA diam:        4.10 cm 1.65 cm/m   RA Area:     18.30 cm LA Vol (A2C):   74.3 ml 29.96 ml/m  RA Volume:   46.30 ml  18.67 ml/m LA Vol (A4C):   56.2 ml 22.66 ml/m LA Biplane Vol: 65.0 ml 26.21 ml/m  AORTIC VALVE LVOT Vmax:   97.40 cm/s LVOT Vmean:  64.000 cm/s LVOT VTI:    0.127 m  AORTA Ao Root diam: 3.60 cm Ao Asc diam:  3.30 cm  SHUNTS Systemic VTI:  0.13 m Systemic Diam: 2.50 cm Dalton [name] Electronically signed by [name] Signature Date/Time: 08/18/2024/2:39:57 PM    Final    CT Angio Chest PE W and/or Wo Contrast Result Date: 08/18/2024 EXAM: CTA of the Chest with contrast for PE 08/18/2024 12:44:41 AM TECHNIQUE: CTA of the chest was performed after the administration of intravenous contrast. Multiplanar reformatted images are provided for review. MIP images are  provided for review. Automated exposure control, iterative reconstruction, and/or weight based adjustment of the mA/kV was utilized to reduce the radiation dose to as low as reasonably achievable. COMPARISON: PA lat chest yesterday, PA lat chest 02/18/2024, CTA chest 11/13/2014. CLINICAL HISTORY: c/f PE c/f PE FINDINGS: PULMONARY ARTERIES: Pulmonary arteries are normal in caliber. No embolism is seen to the segmental level. The subsegmental arterial bed is mostly obscured due to respiratory motion artifact. There are no findings of  acute right heart strain. MEDIASTINUM: There is mild cardiomegaly. No pericardial effusion. The pulmonary veins, aorta, and great vessels are normal caliber. There are minimal single vessel calcifications in the proximal lad coronary artery . . . There is aortic tortuosity and mild atherosclerosis without aneurysm or dissection. Mild mediastinal lipomatosis similar to the prior study. LYMPH NODES: No mediastinal, hilar or axillary lymphadenopathy. LUNGS AND PLEURA: There is a stable 5 mm right middle lobe nodule on series 5 axial 81, consistent with a benign nodule given the length of stability. . No other pulmonary nodules are seen. There is no consolidation, effusion or pneumothorax. UPPER ABDOMEN: There is a small hiatal hernia. The esophagus is mildly patulous with normal wall thickness. SOFT TISSUES AND BONES: There is multilevel bridging enthesopathy of the thoracic spine. Spondylosis. No acute or other significant osseous findings. There is subareolar gynecomastia in both breasts. . Nonspecific symmetric skin thickening laterally in the mid to lower chest, query dependent congestive changes, cellulitis or third spacing. IMPRESSION: 1. No evidence of pulmonary embolism to the segmental level. Subsegmental evaluation is limited by respiratory motion artifact. 2. No acute right heart strain. Mild cardiomegaly. Minimal single vessel cad. 3. Nonspecific symmetric skin thickening  laterally in the mid to lower chest, possibly due to dependent congestive changes, cellulitis, or third spacing. Electronically signed by: Michael Quam MD 08/18/2024 01:13 AM EST RP Workstation: [id number]   DG Chest 2 View Result Date: 08/17/2024 CLINICAL DATA:  Generalized swelling EXAM: CHEST - 2 VIEW COMPARISON:  02/18/2024 FINDINGS: The heart size and mediastinal contours are within normal limits. Both lungs are clear. Degenerative changes of the spine. IMPRESSION: No active cardiopulmonary disease. Electronically Signed   By: Luke Bun M.D.   On: 08/17/2024 17:04    Patient Profile   Evart Mcdonnell Tromp  is a 63 y.o. 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  Echocardiogram this admission shows normal LV function, mild left ventricular hypertrophy, mild biatrial enlargement.  CTA showed no pulmonary embolus.  Assessment & Plan  1 atrial flutter-patient remains in atrial flutter.  Continue metoprolol  and heparin .  He is scheduled to have renal biopsy in the near future.  Will review with nephrology timing.  Would avoid cardioversion for now (he will require 4 weeks of uninterrupted anticoagulation following cardioversion).  Will likely have patient follow-up with electrophysiology as an outpatient for consideration of ablation.  2 acute on chronic diastolic congestive heart failure-volume status has improved.  Continue Lasix  at present dose and follow renal function.  3 history of nonischemic cardiomyopathy-continue Jardiance  and beta-blocker.  Entresto  and spironolactone  on hold in the setting of borderline blood pressure and renal insufficiency.  4 chronic stage III kidney disease-he is scheduled for renal biopsy and need to review timing with nephrology before cardioversion as outlined above.  5  hypertension-patient's blood pressure is controlled.  Continue present medications.  For questions or updates, please contact [facility] Please consult [email or web address] for contact info under       Signed, Redell Shallow, MD  08/19/2024, 7:32 AM

## 2024-08-19 NOTE — Progress Notes (Signed)
 Progress Note   Patient: [name]  FMW:[id number] DOB: 04/02/1961 DOA: 08/17/2024     2 DOS: the patient was seen and examined on 08/19/2024    Brief hospital course:  [name]  is a 63 y.o. 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  Patient was referred to emergency department from cardiology clinic as new onset of atrial fibrillation and concern for acute on chronic CHF exacerbation.  Cardiology consulted   Assessment and Plan: Atrial flutter and New onset of atrial fibrillation rate controlled CHA2DS2-VASc score of 3 Continue telemetry monitoring Currently rate controlled Continue Lopressor  Currently on heparin  drip Cardiology is following we appreciate input     Acute on chronic CHF exacerbation History of combined CHF with reduced EF 35 to 40% Essential hypertension -Noncompliance with oral Lasix  and diet.  Volume overloaded.  Low sodium 126.  Flat troponin.   Chest x-ray showing findings of pulmonary vascular congestion Continue IV Lasix  Monitor input and output as well as daily wean Continue metoprolol  Repeat echo in this admission showing EF of 55% with indeterminate diastolic parameters Cardiology on board and we appreciate input Continue cardiac diet     Hypervolemic hyponatremia -Low sodium 126.  Hypervolemic hyponatremia due to noncompliance with Lasix .   Continue Lasix  Monitor electrolytes closely   CKD 3 A/3B - Creatinine 2.02.  Baseline creatinine around 2.2-2.6 over the course of last 4 months however over the course of last 1 year creatinine has been progressively worsening and patient renal function has been progressed.   Patient has significant proteinuria per outpatient workup and presently spironolactone  on hold as outpatient  nephrology plan for renal biopsy 12/23. -Continue to monitor renal function     Non-insulin -dependent DM type II -Continue Farxiga and sliding scale insulin  with meals as needed.   History of COPD -Continue DuoNeb as needed and Breo Ellipta  once daily   Hyperlipidemia - Continue Crestor    Obstructive sleep apnea-on CPAP - Continue CPAP at bedtime and  consulted respiratory therapy   Insomnia - Continue trazodone  as needed at bedtime   Generalized anxiety disorder - Continue Xanax  0.5 mg bid prn     DVT prophylaxis:  IV heparin  gtts, SCD and TED hose   Code Status:  Full Code Diet: Heart healthy carb modified diet   Family Communication: Discussed with wife at bedside  Disposition Plan: Pending cardiology clearance  Consults: Cardiology and respiratory care  Admission status: Telemetry     Subjective:  Patient seen and examined at bedside this morning Patient denies chest pain nausea vomiting abdominal pain Cardiologist following   Physical Exam:   General: He is not in acute distress.    Appearance: He is obese. He is not ill-appearing.  HENT:     Mouth/Throat:     Mouth: Mucous membranes are moist.  Eyes:     Pupils: Pupils are equal, round, and reactive to light.  Cardiovascular:     Rate and Rhythm: Normal rate. Rhythm irregular.     Pulses: Normal pulses.     Heart sounds: Normal heart sounds.  Pulmonary:     Effort: Pulmonary effort is normal.     Breath sounds: Normal breath sounds.  Abdominal:     Palpations: Abdomen is soft.  Musculoskeletal:        General: Swelling present.     Cervical back: Neck supple.  Right lower leg: Edema present.     Left lower leg: Edema present.  Skin:    Capillary Refill: Capillary refill takes less than 2 seconds.  Neurological:     Mental Status: He is alert and oriented to person, place, and time.  Psychiatric:        Mood and Affect: Mood normal.   Data Reviewed: Chest x-ray showing findings of  pulmonary vascular congestion  Vitals:   08/19/24 0140 08/19/24 0559 08/19/24 0816 08/19/24 1216  BP: 97/67  101/73 95/67  Pulse: 86 96 91 94  Resp: 20     Temp: 97.9 F (36.6 C)  (!) 97.5 F (36.4 C) 97.8 F (36.6 C)  TempSrc: Oral   Oral  SpO2: 100% 98% 97% 98%  Weight:  132.2 kg    Height:          Latest Ref Rng & Units 08/19/2024    4:07 AM 08/18/2024    3:07 AM 08/17/2024    4:03 PM  CBC  WBC 4.0 - 10.5 K/uL 7.4  7.4  7.5   Hemoglobin 13.0 - 17.0 g/dL 87.6  87.5  86.1   Hematocrit 39.0 - 52.0 % 35.6  35.9  40.2   Platelets 150 - 400 K/uL 220  252  270        Latest Ref Rng & Units 08/19/2024    4:07 AM 08/18/2024    8:57 PM 08/18/2024    5:52 PM  BMP  Glucose 70 - 99 mg/dL 91  93  99   BUN 8 - 23 mg/dL 28  29  30    Creatinine 0.61 - 1.24 mg/dL 8.05  8.11  8.00   Sodium 135 - 145 mmol/L 130  131  130   Potassium 3.5 - 5.1 mmol/L 4.2  4.4  5.1   Chloride 98 - 111 mmol/L 103  101  101   CO2 22 - 32 mmol/L 22  26  22    Calcium  8.9 - 10.3 mg/dL 8.0  8.2  7.7      Author: Drue ONEIDA Potter, MD 08/19/2024 2:16 PM  For on call review [email or web address].

## 2024-08-19 NOTE — Progress Notes (Signed)
 PHARMACY - ANTICOAGULATION CONSULT NOTE  Pharmacy Consult for Heparin  Indication: atrial fibrillation  Allergies[1]  Patient Measurements: Height: 5' 10 (177.8 cm) Weight: 132.2 kg (291 lb 6.4 oz) IBW/kg (Calculated) : 73 HEPARIN  DW (KG): 104  Vital Signs: Temp: 97.7 F (36.5 C) (12/13 1502) Temp Source: Oral (12/13 1502) BP: 123/76 (12/13 1502) Pulse Rate: 95 (12/13 1502)  Labs: Recent Labs    08/17/24 1603 08/17/24 1940 08/18/24 0307 08/18/24 0445 08/18/24 1752 08/18/24 2057 08/19/24 0407 08/19/24 1838  HGB 13.8  --  12.4*  --   --   --  12.3*  --   HCT 40.2  --  35.9*  --   --   --  35.6*  --   PLT 270  --  252  --   --   --  220  --   LABPROT 13.7  --   --   --   --   --   --   --   INR 1.0  --   --   --   --   --   --   --   HEPARINUNFRC  --   --   --    < >  --  0.58 0.80* 0.88*  CREATININE 2.20*  --   --    < > 1.99* 1.88* 1.94*  --   TROPONINIHS 3 <2  --   --   --   --   --   --    < > = values in this interval not displayed.    Estimated Creatinine Clearance: 54.7 mL/min (A) (by C-G formula based on SCr of 1.94 mg/dL (H)).   Medical History: Past Medical History:  Diagnosis Date   Abscess of anal and rectal regions    Acute renal insufficiency    Allergy 2005   Shellfish   Anxiety    Arthritis    Asthma    Cardiac arrhythmia    Chest pain    CHF (congestive heart failure) (HCC)    COPD (chronic obstructive pulmonary disease) (HCC)    Depression    Diabetes mellitus    pre diabetic- no meds   DOE (dyspnea on exertion)    GERD (gastroesophageal reflux disease)    Hemorrhoids, internal    Hyperlipidemia    Hyperplastic colon polyp    Hypertension    IBS (irritable bowel syndrome)    Sleep apnea 2003   BPAP    Medications:  Medications Ordered Prior to Encounter[2]   Assessment: 63 y.o. male with new onset Afib for heparin .   08/19/24 PM: Heparin  level 0.88, supra-therapeutic at heparin  1700 units/hr.   Goal of Therapy:   Heparin  level 0.3-0.7 units/ml Monitor platelets by anticoagulation protocol: Yes   Plan:  Decrease heparin  to 1500 units/hr Check heparin  level with AM labs Monitor heparin  level, CBC, and s/sx of bleeding daily  Larraine Brazier, PharmD Clinical Pharmacist 08/19/2024  7:12 PM **Pharmacist phone directory can now be found on [email or web address] ([email or web address]).  Listed under [facility].       [1]  Allergies Allergen Reactions   Shellfish Allergy Nausea And Vomiting   Buspar  [Buspirone ] Other (See Comments)    headache  [2]  No current facility-administered medications on file prior to encounter.   Current Outpatient Medications on File Prior to Encounter  Medication Sig Dispense Refill   acetaminophen  (TYLENOL ) 650 MG CR tablet Take 1,300 mg by mouth every 8 (eight) hours as needed for pain (chronic knee pain).  albuterol  (VENTOLIN  HFA) 108 (90 Base) MCG/ACT inhaler INHALE 2 PUFFS BY MOUTH EVERY 4 HOURS AS NEEDED FOR WHEEZE OR FOR SHORTNESS OF BREATH 8.5 each 3   ALPRAZolam  (XANAX ) 0.5 MG tablet Take 1 tablet (0.5 mg total) by mouth 2 (two) times daily as needed for anxiety. 30 tablet 5   aspirin  EC 81 MG tablet Take 81 mg by mouth in the morning. Swallow whole.     carvedilol  (COREG ) 12.5 MG tablet Take 1 tablet (12.5 mg total) by mouth 2 (two) times daily. (Patient taking differently: Take 12.5 mg by mouth in the morning.) 180 tablet 3   empagliflozin  (JARDIANCE ) 25 MG TABS tablet Take 1 tablet (25 mg total) by mouth daily before breakfast. 90 tablet 0   fluticasone  furoate-vilanterol (BREO ELLIPTA ) 200-25 MCG/ACT AEPB Inhale 1 puff into the lungs daily. 90 each 3   furosemide  (LASIX ) 40 MG tablet Take 40 mg by mouth in the morning.     lidocaine  4 % Place 1 patch onto the skin daily.     Multiple Vitamin (MULTIVITAMIN) tablet Take 1 tablet by mouth daily.     omeprazole  (PRILOSEC) 40 MG capsule TAKE 1 CAPSULE (40 MG TOTAL) BY MOUTH DAILY. OFFICE VISIT FOR FURTHER REFILLS 90 capsule 0    Polyethyl Glyc-Propyl Glyc PF (SYSTANE HYDRATION PF) 0.4-0.3 % SOLN Place 1 drop into both eyes daily as needed (dry eyes, eye irritations).     rosuvastatin  (CRESTOR ) 10 MG tablet Take 1 tablet (10 mg total) by mouth daily. (Patient taking differently: Take 10 mg by mouth daily after supper.) 90 tablet 3   sacubitril -valsartan  (ENTRESTO ) 97-103 MG TAKE 1 TABLET BY MOUTH TWICE A DAY 180 tablet 0   traZODone  (DESYREL ) 100 MG tablet Take 1-2 tablets (100-200 mg total) by mouth at bedtime as needed. (Patient taking differently: Take 50-200 mg by mouth at bedtime.) 180 tablet 1

## 2024-08-19 NOTE — Progress Notes (Signed)
 PHARMACY - ANTICOAGULATION CONSULT NOTE  Pharmacy Consult for Heparin  Indication: atrial fibrillation  Allergies[1]  Patient Measurements: Height: 5' 10 (177.8 cm) Weight: 132.2 kg (291 lb 6.4 oz) IBW/kg (Calculated) : 73 HEPARIN  DW (KG): 104  Vital Signs: Temp: 97.9 F (36.6 C) (12/13 0140) Temp Source: Oral (12/13 0140) BP: 101/73 (12/13 0816) Pulse Rate: 91 (12/13 0816)  Labs: Recent Labs    08/17/24 1603 08/17/24 1940 08/18/24 0307 08/18/24 0445 08/18/24 0920 08/18/24 1752 08/18/24 2057 08/19/24 0407  HGB 13.8  --  12.4*  --   --   --   --  12.3*  HCT 40.2  --  35.9*  --   --   --   --  35.6*  PLT 270  --  252  --   --   --   --  220  LABPROT 13.7  --   --   --   --   --   --   --   INR 1.0  --   --   --   --   --   --   --   HEPARINUNFRC  --   --   --   --  0.12*  --  0.58 0.80*  CREATININE 2.20*  --   --    < > 1.95* 1.99* 1.88* 1.94*  TROPONINIHS 3 <2  --   --   --   --   --   --    < > = values in this interval not displayed.    Estimated Creatinine Clearance: 54.7 mL/min (A) (by C-G formula based on SCr of 1.94 mg/dL (H)).   Medical History: Past Medical History:  Diagnosis Date   Abscess of anal and rectal regions    Acute renal insufficiency    Allergy 2005   Shellfish   Anxiety    Arthritis    Asthma    Cardiac arrhythmia    Chest pain    CHF (congestive heart failure) (HCC)    COPD (chronic obstructive pulmonary disease) (HCC)    Depression    Diabetes mellitus    pre diabetic- no meds   DOE (dyspnea on exertion)    GERD (gastroesophageal reflux disease)    Hemorrhoids, internal    Hyperlipidemia    Hyperplastic colon polyp    Hypertension    IBS (irritable bowel syndrome)    Sleep apnea 2003   BPAP    Medications:  Medications Ordered Prior to Encounter[2]   Assessment: 63 y.o. male with new onset Afib for heparin .   08/19/24 AM: Heparin  level 0.80, supra-therapeutic at heparin  1900 units/hr. No issues with infusion  running or signs of bleeding per RN. CBC stable (Hgb 12.3, PLT 220).   Goal of Therapy:  Heparin  level 0.3-0.7 units/ml Monitor platelets by anticoagulation protocol: Yes   Plan:  Decrease heparin  to 1700 unit/shr Check heparin  level in 6 hrs Monitor heparin  level, CBC, and s/sx of bleeding daily  Morna Breach, PharmD, BCPS PGY2 Cardiology Pharmacy Resident 08/19/2024 8:27 AM    [1]  Allergies Allergen Reactions   Shellfish Allergy Nausea And Vomiting   Buspar  [Buspirone ] Other (See Comments)    headache  [2]  No current facility-administered medications on file prior to encounter.   Current Outpatient Medications on File Prior to Encounter  Medication Sig Dispense Refill   acetaminophen  (TYLENOL ) 650 MG CR tablet Take 1,300 mg by mouth every 8 (eight) hours as needed for pain (chronic knee pain).  albuterol  (VENTOLIN  HFA) 108 (90 Base) MCG/ACT inhaler INHALE 2 PUFFS BY MOUTH EVERY 4 HOURS AS NEEDED FOR WHEEZE OR FOR SHORTNESS OF BREATH 8.5 each 3   ALPRAZolam  (XANAX ) 0.5 MG tablet Take 1 tablet (0.5 mg total) by mouth 2 (two) times daily as needed for anxiety. 30 tablet 5   aspirin  EC 81 MG tablet Take 81 mg by mouth in the morning. Swallow whole.     carvedilol  (COREG ) 12.5 MG tablet Take 1 tablet (12.5 mg total) by mouth 2 (two) times daily. (Patient taking differently: Take 12.5 mg by mouth in the morning.) 180 tablet 3   empagliflozin  (JARDIANCE ) 25 MG TABS tablet Take 1 tablet (25 mg total) by mouth daily before breakfast. 90 tablet 0   fluticasone  furoate-vilanterol (BREO ELLIPTA ) 200-25 MCG/ACT AEPB Inhale 1 puff into the lungs daily. 90 each 3   furosemide  (LASIX ) 40 MG tablet Take 40 mg by mouth in the morning.     lidocaine  4 % Place 1 patch onto the skin daily.     Multiple Vitamin (MULTIVITAMIN) tablet Take 1 tablet by mouth daily.     omeprazole  (PRILOSEC) 40 MG capsule TAKE 1 CAPSULE (40 MG TOTAL) BY MOUTH DAILY. OFFICE VISIT FOR FURTHER REFILLS 90 capsule 0    Polyethyl Glyc-Propyl Glyc PF (SYSTANE HYDRATION PF) 0.4-0.3 % SOLN Place 1 drop into both eyes daily as needed (dry eyes, eye irritations).     rosuvastatin  (CRESTOR ) 10 MG tablet Take 1 tablet (10 mg total) by mouth daily. (Patient taking differently: Take 10 mg by mouth daily after supper.) 90 tablet 3   sacubitril -valsartan  (ENTRESTO ) 97-103 MG TAKE 1 TABLET BY MOUTH TWICE A DAY 180 tablet 0   traZODone  (DESYREL ) 100 MG tablet Take 1-2 tablets (100-200 mg total) by mouth at bedtime as needed. (Patient taking differently: Take 50-200 mg by mouth at bedtime.) 180 tablet 1

## 2024-08-20 DIAGNOSIS — I483 Typical atrial flutter: Secondary | ICD-10-CM | POA: Diagnosis not present

## 2024-08-20 LAB — BASIC METABOLIC PANEL WITH GFR
Anion gap: 12 (ref 5–15)
BUN: 28 mg/dL — ABNORMAL HIGH (ref 8–23)
CO2: 19 mmol/L — ABNORMAL LOW (ref 22–32)
Calcium: 7.8 mg/dL — ABNORMAL LOW (ref 8.9–10.3)
Chloride: 102 mmol/L (ref 98–111)
Creatinine, Ser: 1.95 mg/dL — ABNORMAL HIGH (ref 0.61–1.24)
GFR, Estimated: 38 mL/min — ABNORMAL LOW (ref >60)
Glucose, Bld: 82 mg/dL (ref 70–99)
Potassium: 4 mmol/L (ref 3.5–5.1)
Sodium: 133 mmol/L — ABNORMAL LOW (ref 135–145)

## 2024-08-20 LAB — CBC
HCT: 34 % — ABNORMAL LOW (ref 39.0–52.0)
Hemoglobin: 11.5 g/dL — ABNORMAL LOW (ref 13.0–17.0)
MCH: 31.9 pg (ref 26.0–34.0)
MCHC: 33.8 g/dL (ref 30.0–36.0)
MCV: 94.2 fL (ref 80.0–100.0)
Platelets: 201 K/uL (ref 150–400)
RBC: 3.61 MIL/uL — ABNORMAL LOW (ref 4.22–5.81)
RDW: 13.4 % (ref 11.5–15.5)
WBC: 6.9 K/uL (ref 4.0–10.5)
nRBC: 0 % (ref 0.0–0.2)

## 2024-08-20 LAB — GLUCOSE, CAPILLARY
Glucose-Capillary: 89 mg/dL (ref 70–99)
Glucose-Capillary: 95 mg/dL (ref 70–99)
Glucose-Capillary: 85 mg/dL (ref 70–99)
Glucose-Capillary: 99 mg/dL (ref 70–99)

## 2024-08-20 LAB — HEPARIN LEVEL (UNFRACTIONATED): Heparin Unfractionated: 0.72 [IU]/mL — ABNORMAL HIGH (ref 0.30–0.70)

## 2024-08-20 MED ORDER — LOPERAMIDE HCL 2 MG PO CAPS
2.0000 mg | ORAL_CAPSULE | Freq: Once | ORAL | Status: AC | PRN
  Administered 2024-08-20: 2 mg via ORAL
  Filled 2024-08-20: qty 1
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
## 2024-08-20 NOTE — Progress Notes (Signed)
°   08/20/24 2000  BiPAP/CPAP/SIPAP  BiPAP/CPAP/SIPAP Pt Type Adult (Pt places himself on home unit. RT will assist as needed)  Patient Home Machine Yes  Safety Check Completed by RT for Home Unit Yes, no issues noted  Patient Home Mask Yes  Patient Home Tubing Yes  BiPAP/CPAP /SiPAP Vitals  Pulse Rate 89  SpO2 96 %  MEWS Score/Color  MEWS Score 0  MEWS Score Color Landy

## 2024-08-20 NOTE — Progress Notes (Signed)
 Rounding Note   Patient Name: [name]  Date of Encounter: 08/20/2024  [facility] Cardiologist: Redell Shallow, MD   Subjective Denies CP or dyspnea  Scheduled Meds:  aspirin   81 mg Oral Daily   empagliflozin   25 mg Oral QAC breakfast   fluticasone  furoate-vilanterol  1 puff Inhalation Daily   furosemide   40 mg Intravenous BID   insulin  aspart  0-5 Units Subcutaneous QHS   metoprolol  tartrate  25 mg Oral BID   pantoprazole   40 mg Oral Daily   rosuvastatin   10 mg Oral Daily   sodium chloride  flush  3 mL Intravenous Q12H   sodium chloride  flush  3 mL Intravenous Q12H   Continuous Infusions:  heparin  1,400 Units/hr (08/20/24 0840)   PRN Meds: acetaminophen  **OR** acetaminophen , ALPRAZolam , insulin  aspart, ipratropium-albuterol , ondansetron  **OR** ondansetron  (ZOFRAN ) IV, sodium chloride  flush, traZODone    Vital Signs  Vitals:   08/20/24 0337 08/20/24 0711 08/20/24 0741 08/20/24 0823  BP: 116/86 106/85  106/85  Pulse: 84 96  96  Resp: 18 18    Temp: 97.9 F (36.6 C) 97.6 F (36.4 C)    TempSrc: Oral Oral    SpO2: 97% 98% 98%   Weight: 131.8 kg     Height:        Intake/Output Summary (Last 24 hours) at 08/20/2024 0928 Last data filed at 08/20/2024 0827 Gross per 24 hour  Intake 1419.43 ml  Output 900 ml  Net 519.43 ml      08/20/2024    3:37 AM 08/19/2024    5:59 AM 08/18/2024    4:44 PM  Last 3 Weights  Weight (lbs) 290 lb 9.6 oz 291 lb 6.4 oz 295 lb 3.1 oz  Weight (kg) 131.815 kg 132.178 kg 133.9 kg      Telemetry Atrial flutter with variable conduction- Personally Reviewed   Physical Exam  GEN: NAD Neck: supple Cardiac: irregular, no gallop Respiratory: CTA GI: Soft, NT/ND MS: trace edema Neuro:  Grossly intact Psych: Normal affect   Labs High Sensitivity Troponin:   Recent Labs  Lab 08/17/24 1603 08/17/24 1940  TROPONINIHS 3 <2      Chemistry Recent Labs  Lab 08/17/24 1603 08/18/24 0445 08/18/24 0920  08/18/24 2057 08/19/24 0407 08/20/24 0331  NA 126* 131*   < > 131* 130* 133*  K 4.8 4.3   < > 4.4 4.2 4.0  CL 95* 103   < > 101 103 102  CO2 22 22   < > 26 22 19*  GLUCOSE 83 81   < > 93 91 82  BUN 31* 30*   < > 29* 28* 28*  CREATININE 2.20* 1.81*   < > 1.88* 1.94* 1.95*  CALCIUM  7.8* 7.2*   < > 8.2* 8.0* 7.8*  PROT 5.1* 4.6*  --   --   --   --   ALBUMIN  <1.5* 1.5*  --   --   --   --   AST 25 20  --   --   --   --   ALT 20 16  --   --   --   --   ALKPHOS 82 68  --   --   --   --   BILITOT 0.5 0.5  --   --   --   --   GFRNONAA 33* 42*   < > 40* 39* 38*  ANIONGAP 9 6   < > 4* 5 12   < > = values in  this interval not displayed.     Hematology Recent Labs  Lab 08/18/24 0307 08/19/24 0407 08/20/24 0331  WBC 7.4 7.4 6.9  RBC 3.85* 3.85* 3.61*  HGB 12.4* 12.3* 11.5*  HCT 35.9* 35.6* 34.0*  MCV 93.2 92.5 94.2  MCH 32.2 31.9 31.9  MCHC 34.5 34.6 33.8  RDW 13.1 13.3 13.4  PLT 252 220 201   Thyroid   Recent Labs  Lab 08/17/24 2344  TSH 1.766    BNP Recent Labs  Lab 08/17/24 1601 08/18/24 0307  BNP 113.3* 63.1      Patient Profile   [name]  is a 63 y.o. 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  Echocardiogram this admission shows normal LV function, mild left ventricular hypertrophy, mild biatrial enlargement.  CTA showed no pulmonary embolus.  Assessment & Plan  1 atrial flutter-patient remains in atrial flutter.  Continue metoprolol  and heparin .  He is scheduled to have renal biopsy in the near future.  Need to review with nephrology timing.  Would avoid cardioversion for now (he will require 4 weeks of uninterrupted anticoagulation following cardioversion which would delay biopsy).  Will likely have patient follow-up with electrophysiology as an outpatient for consideration of  ablation.  2 acute on chronic diastolic congestive heart failure-volume status has improved.  Continue Lasix  at present dose and follow renal function.  3 history of nonischemic cardiomyopathy-continue Jardiance  and beta-blocker.  Entresto  and spironolactone  on hold in the setting of borderline blood pressure and renal insufficiency.  4 chronic stage III kidney disease-he is scheduled for renal biopsy and need to review timing with nephrology before cardioversion as outlined above.  5 hypertension-patient's blood pressure is controlled.  Continue present medications.  For questions or updates, please contact [facility] Please consult [email or web address] for contact info under       Signed, Redell Shallow, MD  08/20/2024, 9:28 AM

## 2024-08-20 NOTE — Progress Notes (Signed)
 Pt was complaining of loose bowel movements/diarrhea, attempted to notify [place], MD. See new orders.  Lonell LITTIE Lyme, RN

## 2024-08-20 NOTE — Progress Notes (Addendum)
 Progress Note    [name]   FMW:[id number] DOB: [birth year]  DOA: 08/17/2024 PCP: [name], MD      Brief Narrative:    Medical records reviewed and are as summarized below:  [name]  is a 63 y.o. 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  He was referred to the ED from the cardiology clinic because of new onset A-fib with concern for CHF exacerbation.      Assessment/Plan:   Principal Problem:   Paroxysmal atrial fibrillation (HCC) Active Problems:   Acute on chronic combined systolic and diastolic CHF (congestive heart failure) (HCC)   Hyponatremia   Essential hypertension   Morbid obesity (HCC)   Sleep apnea   GAD (generalized anxiety disorder)   Diabetes mellitus without complication (HCC)   Hyperlipidemia associated with type 2 diabetes mellitus (HCC)   History of COPD   New onset atrial fibrillation (HCC)   Body mass index is 41.7 kg/m.   Paroxysmal atrial fibrillation and atrial flutter: Patient is still in atrial flutter. Continue metoprolol  and IV heparin  drip.  Monitor heparin  level per protocol. Patient is contemplating cardioversion. Patient will likely follow-up with electrophysiologist as an outpatient for consideration of ablation   Acute on chronic diastolic CHF, history of nonischemic cardiomyopathy: Improving.  Continue IV Lasix .  Monitor BMP, daily weight and urine output. Entresto  and spironolactone  on hold because of borderline BP on CKD   CKD stage IIIb: His nephrologist was planning renal biopsy in the outpatient setting.  Cardiologist is planning cardioversion and prefers that renal biopsy be done prior to cardioversion he will be on anticoagulation that may preclude renal biopsy. Consulted Dr. Geralynn,  nephrologist, per cardiologist request. Dr. Geralynn recommended stopping aspirin  in anticipation for renal biopsy.   Comorbidities include OSA on CPAP, hypertension, general anxiety disorder, hyperlipidemia, COPD, type II DM    Diet Order             Diet heart healthy/carb modified Room service appropriate? Yes; Fluid consistency: Thin; Fluid restriction: 2000 mL Fluid  Diet effective now                                  Consultants: Cardiologist Nephrologist  Procedures: None    Medications:    aspirin   81 mg Oral Daily   empagliflozin   25 mg Oral QAC breakfast   fluticasone  furoate-vilanterol  1 puff Inhalation Daily   furosemide   40 mg Intravenous BID   insulin  aspart  0-5 Units Subcutaneous QHS   metoprolol  tartrate  25 mg Oral BID   pantoprazole   40 mg Oral Daily   rosuvastatin   10 mg Oral Daily   sodium chloride  flush  3 mL Intravenous Q12H   sodium chloride  flush  3 mL Intravenous Q12H   Continuous Infusions:  heparin  1,400 Units/hr (08/20/24 0840)     Anti-infectives (From admission, onward)    None              Family Communication/Anticipated D/C date and plan/Code Status   DVT prophylaxis: SCDs Start: 08/17/24 2347 Place TED hose Start: 08/17/24 2347     Code Status: Full Code  Family Communication: Plan discussed with his wife at the bedside Disposition Plan: Plan to discharge home   Status is: Inpatient  Remains inpatient appropriate because: A-fib, CHF exacerbation       Subjective:   Interval events noted.  He reports significant improvement in the upper and lower extremity swelling and abdominal distention.  No shortness of breath or chest pain.  He feels better.  His wife was at the bedside.  Objective:    Vitals:   08/20/24 0711 08/20/24 0741 08/20/24 0823 08/20/24 1048  BP: 106/85  106/85 105/78  Pulse: 96  96   Resp: 18   17  Temp: 97.6 F (36.4 C)   97.7 F (36.5 C)  TempSrc: Oral    Oral  SpO2: 98% 98%    Weight:      Height:       No data found.   Intake/Output Summary (Last 24 hours) at 08/20/2024 1210 Last data filed at 08/20/2024 1049 Gross per 24 hour  Intake 1298.88 ml  Output 1450 ml  Net -151.12 ml   Filed Weights   08/18/24 1644 08/19/24 0559 08/20/24 0337  Weight: 133.9 kg 132.2 kg 131.8 kg    Exam:  GEN: NAD SKIN: Warm and dry EYES: No pallor or icterus ENT: MMM CV: RRR PULM: CTA B ABD: soft, mild distention, NT, +BS CNS: AAO x 3, non focal EXT: Edema bilateral upper and lower extremities slowly improving        Data Reviewed:   I have personally reviewed following labs and imaging studies:  Labs: Labs show the following:   Basic Metabolic Panel: Recent Labs  Lab 08/18/24 0920 08/18/24 1752 08/18/24 2057 08/19/24 0407 08/20/24 0331  NA 130* 130* 131* 130* 133*  K 4.1 5.1 4.4 4.2 4.0  CL 100 101 101 103 102  CO2 23 22 26 22  19*  GLUCOSE 126* 99 93 91 82  BUN 31* 30* 29* 28* 28*  CREATININE 1.95* 1.99* 1.88* 1.94* 1.95*  CALCIUM  7.6* 7.7* 8.2* 8.0* 7.8*   GFR Estimated Creatinine Clearance: 54.3 mL/min (A) (by C-G formula based on SCr of 1.95 mg/dL (H)). Liver Function Tests: Recent Labs  Lab 08/17/24 1603 08/18/24 0445  AST 25 20  ALT 20 16  ALKPHOS 82 68  BILITOT 0.5 0.5  PROT 5.1* 4.6*  ALBUMIN  <1.5* 1.5*   No results for input(s): LIPASE, AMYLASE in the last 168 hours. No results for input(s): AMMONIA in the last 168 hours. Coagulation profile Recent Labs  Lab 08/17/24 1603  INR 1.0    CBC: Recent Labs  Lab 08/17/24 1603 08/18/24 0307 08/19/24 0407 08/20/24 0331  WBC 7.5 7.4 7.4 6.9  NEUTROABS 5.3  --   --   --   HGB 13.8 12.4* 12.3* 11.5*  HCT 40.2 35.9* 35.6* 34.0*  MCV 93.3 93.2 92.5 94.2  PLT 270 252 220 201   Cardiac Enzymes: No results for input(s): CKTOTAL, CKMB, CKMBINDEX, TROPONINI in the last 168 hours. BNP (last 3 results) No results for input(s): PROBNP  in the last 8760 hours. CBG: Recent Labs  Lab 08/19/24 1136 08/19/24 1643 08/19/24 2115 08/20/24 0605 08/20/24 1055  GLUCAP 99 87 90 89 95   D-Dimer: No results for input(s): DDIMER in the last 72 hours. Hgb A1c: No results for input(s): HGBA1C in the last 72 hours. Lipid Profile: No results for input(s): CHOL, HDL, LDLCALC, TRIG, CHOLHDL, LDLDIRECT in the last 72 hours. Thyroid  function studies: Recent Labs    08/17/24 2344  TSH 1.766   Anemia work up: No results for input(s): VITAMINB12, FOLATE, FERRITIN, TIBC, IRON, RETICCTPCT in the last 72  hours. Sepsis Labs: Recent Labs  Lab 08/17/24 1603 08/18/24 0307 08/19/24 0407 08/20/24 0331  WBC 7.5 7.4 7.4 6.9    Microbiology Recent Results (from the past 240 hours)  MRSA Next Gen by PCR, Nasal     Status: None   Collection Time: 08/18/24  4:49 PM   Specimen: Nasal Mucosa; Nasal Swab  Result Value Ref Range Status   MRSA by PCR Next Gen NOT DETECTED NOT DETECTED Final    Comment: (NOTE) The GeneXpert MRSA Assay (FDA approved for NASAL specimens only), is one component of a comprehensive MRSA colonization surveillance program. It is not intended to diagnose MRSA infection nor to guide or monitor treatment for MRSA infections. Test performance is not FDA approved in patients less than 18 years old. Performed at [facility], [address], [place], [place] [address]     Procedures and diagnostic studies:  No results found.             LOS: 3 days   Islah Eve  [facility]   Pager on [email or web address]. If 7PM-7AM, please contact night-coverage at [email or web address]     08/20/2024, 12:10 PM

## 2024-08-20 NOTE — Progress Notes (Signed)
 PHARMACY - ANTICOAGULATION CONSULT NOTE  Pharmacy Consult for Heparin  Indication: atrial fibrillation  Allergies[1]  Patient Measurements: Height: 5' 10 (177.8 cm) Weight: 131.8 kg (290 lb 9.6 oz) IBW/kg (Calculated) : 73 HEPARIN  DW (KG): 104  Vital Signs: Temp: 97.6 F (36.4 C) (12/14 0711) Temp Source: Oral (12/14 0711) BP: 106/85 (12/14 0823) Pulse Rate: 96 (12/14 0823)  Labs: Recent Labs    08/17/24 1603 08/17/24 1940 08/18/24 0307 08/18/24 0445 08/18/24 2057 08/19/24 0407 08/19/24 1838 08/20/24 0331  HGB 13.8  --  12.4*  --   --  12.3*  --  11.5*  HCT 40.2  --  35.9*  --   --  35.6*  --  34.0*  PLT 270  --  252  --   --  220  --  201  LABPROT 13.7  --   --   --   --   --   --   --   INR 1.0  --   --   --   --   --   --   --   HEPARINUNFRC  --   --   --    < > 0.58 0.80* 0.88* 0.72*  CREATININE 2.20*  --   --    < > 1.88* 1.94*  --  1.95*  TROPONINIHS 3 <2  --   --   --   --   --   --    < > = values in this interval not displayed.    Estimated Creatinine Clearance: 54.3 mL/min (A) (by C-G formula based on SCr of 1.95 mg/dL (H)).   Medical History: Past Medical History:  Diagnosis Date   Abscess of anal and rectal regions    Acute renal insufficiency    Allergy 2005   Shellfish   Anxiety    Arthritis    Asthma    Cardiac arrhythmia    Chest pain    CHF (congestive heart failure) (HCC)    COPD (chronic obstructive pulmonary disease) (HCC)    Depression    Diabetes mellitus    pre diabetic- no meds   DOE (dyspnea on exertion)    GERD (gastroesophageal reflux disease)    Hemorrhoids, internal    Hyperlipidemia    Hyperplastic colon polyp    Hypertension    IBS (irritable bowel syndrome)    Sleep apnea 2003   BPAP    Medications:  Medications Ordered Prior to Encounter[2]   Assessment: 63 y.o. male with new onset Afib for heparin .   08/20/24 AM: Heparin  level 0.72, slightly supra-therapeutic at heparin  1500 units/hr. No issues with  infusion running or signs of bleeding per RN. CBC stable (Hgb 11.5, PLT 201).   Goal of Therapy:  Heparin  level 0.3-0.7 units/ml Monitor platelets by anticoagulation protocol: Yes   Plan:  Decrease heparin  to 1400 unit/shr Check heparin  level with AM labs tomorrow Monitor heparin  level, CBC, and s/sx of bleeding daily  Morna Breach, PharmD, BCPS PGY2 Cardiology Pharmacy Resident 08/20/2024 8:39 AM    [1]  Allergies Allergen Reactions   Shellfish Allergy Nausea And Vomiting   Buspar  [Buspirone ] Other (See Comments)    headache  [2]  No current facility-administered medications on file prior to encounter.   Current Outpatient Medications on File Prior to Encounter  Medication Sig Dispense Refill   acetaminophen  (TYLENOL ) 650 MG CR tablet Take 1,300 mg by mouth every 8 (eight) hours as needed for pain (chronic knee pain).     albuterol  (VENTOLIN   HFA) 108 (90 Base) MCG/ACT inhaler INHALE 2 PUFFS BY MOUTH EVERY 4 HOURS AS NEEDED FOR WHEEZE OR FOR SHORTNESS OF BREATH 8.5 each 3   ALPRAZolam  (XANAX ) 0.5 MG tablet Take 1 tablet (0.5 mg total) by mouth 2 (two) times daily as needed for anxiety. 30 tablet 5   aspirin  EC 81 MG tablet Take 81 mg by mouth in the morning. Swallow whole.     carvedilol  (COREG ) 12.5 MG tablet Take 1 tablet (12.5 mg total) by mouth 2 (two) times daily. (Patient taking differently: Take 12.5 mg by mouth in the morning.) 180 tablet 3   empagliflozin  (JARDIANCE ) 25 MG TABS tablet Take 1 tablet (25 mg total) by mouth daily before breakfast. 90 tablet 0   fluticasone  furoate-vilanterol (BREO ELLIPTA ) 200-25 MCG/ACT AEPB Inhale 1 puff into the lungs daily. 90 each 3   furosemide  (LASIX ) 40 MG tablet Take 40 mg by mouth in the morning.     lidocaine  4 % Place 1 patch onto the skin daily.     Multiple Vitamin (MULTIVITAMIN) tablet Take 1 tablet by mouth daily.     omeprazole  (PRILOSEC) 40 MG capsule TAKE 1 CAPSULE (40 MG TOTAL) BY MOUTH DAILY. OFFICE VISIT FOR  FURTHER REFILLS 90 capsule 0   Polyethyl Glyc-Propyl Glyc PF (SYSTANE HYDRATION PF) 0.4-0.3 % SOLN Place 1 drop into both eyes daily as needed (dry eyes, eye irritations).     rosuvastatin  (CRESTOR ) 10 MG tablet Take 1 tablet (10 mg total) by mouth daily. (Patient taking differently: Take 10 mg by mouth daily after supper.) 90 tablet 3   sacubitril -valsartan  (ENTRESTO ) 97-103 MG TAKE 1 TABLET BY MOUTH TWICE A DAY 180 tablet 0   traZODone  (DESYREL ) 100 MG tablet Take 1-2 tablets (100-200 mg total) by mouth at bedtime as needed. (Patient taking differently: Take 50-200 mg by mouth at bedtime.) 180 tablet 1

## 2024-08-21 LAB — GLUCOSE, CAPILLARY
Glucose-Capillary: 81 mg/dL (ref 70–99)
Glucose-Capillary: 102 mg/dL — ABNORMAL HIGH (ref 70–99)
Glucose-Capillary: 90 mg/dL (ref 70–99)
Glucose-Capillary: 100 mg/dL — ABNORMAL HIGH (ref 70–99)

## 2024-08-21 LAB — COMPREHENSIVE METABOLIC PANEL WITH GFR
ALT: 20 U/L (ref 0–44)
AST: 32 U/L (ref 15–41)
Albumin: 1.6 g/dL — ABNORMAL LOW (ref 3.5–5.0)
Alkaline Phosphatase: 58 U/L (ref 38–126)
Anion gap: 6 (ref 5–15)
BUN: 28 mg/dL — ABNORMAL HIGH (ref 8–23)
CO2: 23 mmol/L (ref 22–32)
Calcium: 7.6 mg/dL — ABNORMAL LOW (ref 8.9–10.3)
Chloride: 105 mmol/L (ref 98–111)
Creatinine, Ser: 2.13 mg/dL — ABNORMAL HIGH (ref 0.61–1.24)
GFR, Estimated: 35 mL/min — ABNORMAL LOW (ref >60)
Glucose, Bld: 86 mg/dL (ref 70–99)
Potassium: 4.1 mmol/L (ref 3.5–5.1)
Sodium: 134 mmol/L — ABNORMAL LOW (ref 135–145)
Total Bilirubin: 0.7 mg/dL (ref 0.0–1.2)
Total Protein: 4.9 g/dL — ABNORMAL LOW (ref 6.5–8.1)

## 2024-08-21 LAB — HEPARIN LEVEL (UNFRACTIONATED): Heparin Unfractionated: 0.34 [IU]/mL (ref 0.30–0.70)

## 2024-08-21 MED ORDER — LOPERAMIDE HCL 2 MG PO CAPS: 2.0000 mg | ORAL_CAPSULE | ORAL | Status: DC | PRN

## 2024-08-21 NOTE — Progress Notes (Addendum)
 As below, patient seen and examined.  He denies dyspnea or chest pain.  He remains in atrial flutter this morning.  Continue metoprolol  for rate control.  He is scheduled for renal biopsy tomorrow.  Once hemostasis achieved will resume anticoagulation and proceed with either TEE guided cardioversion or cardioversion 21 days after on therapeutic anticoagulation.  Will continue Lasix  for now and follow renal function.  Redell Shallow, MD     Progress Note  Patient Name: [name]  Date of Encounter: 08/21/2024 [facility] Cardiologist: Redell Shallow, MD   Interval Summary   Denied lightheadedness, dizziness, chest pain, palpitations, shortness of breath, PND, orthopnea, and appetite change. Overall feels good Reports ongoing peripheral edema but improved  Vital Signs Vitals:   08/20/24 2129 08/20/24 2342 08/21/24 0430 08/21/24 0800  BP: 134/81 117/83 (!) 118/92 125/75  Pulse: (!) 109 73 82 98  Resp:  17 18 17   Temp:  98.2 F (36.8 C) 97.6 F (36.4 C) 98.4 F (36.9 C)  TempSrc:  Oral Oral Oral  SpO2:  94% 98% 97%  Weight:   130.2 kg   Height:        Intake/Output Summary (Last 24 hours) at 08/21/2024 0840 Last data filed at 08/21/2024 9171 Gross per 24 hour  Intake 599.28 ml  Output 2500 ml  Net -1900.72 ml      08/21/2024    4:30 AM 08/20/2024    3:37 AM 08/19/2024    5:59 AM  Last 3 Weights  Weight (lbs) 287 lb 0.6 oz 290 lb 9.6 oz 291 lb 6.4 oz  Weight (kg) 130.2 kg 131.815 kg 132.178 kg      Telemetry/ECG  AFL [at night slow ventricular response HR ~45] [AFL and AF during the day VR ~105 with brief intermittent episodes of HR ~130]  - Personally Reviewed  Physical Exam  GEN: No acute distress.   Neck: JVD Cardiac: Irregularly, irregular, no murmurs, rubs, or gallops.  Respiratory: Clear to auscultation bilaterally. GI: Soft, nontender, non-distended  MS: 1+ pitting edema with compression stockings in place  Assessment & Plan  Tacari Repass  Arrick  is a 63 y.o. male with a hx of chronic systolic and diastolic heart failure secondary to hypertensive heart disease/NICM, obesity, DM, HLD, and CKD 3 with proteinuria who is being seen 08/18/2024 for the evaluation of new Afib RVR. Echocardiogram this admission shows normal LV function, mild left ventricular hypertrophy, mild biatrial enlargement. CTA showed no pulmonary embolus.   Atrial Flutter Telemetry shows AFL with slow ventricular response overnight and intermittent episodes of RVR. Currently in AF HR ~105  Slow VR most likely 2/2 OSA. Will hold on further BB titration.  Continue IV heparin , patient is pending renal biopsy most likely tomorrow Continue lopressor  25 mg BID  Acute on chronic systolic and diastolic heart failure  Receiving IV lasix  40mg  BID Net IO Since Admission: -465.94 mL [08/21/24 0842] On exam, volume up Albumin  on admission 1.5, could be contributing to assessment Will update CMP today.   Continue IV lasix  40 mg BID BB as above, will need to consolidate at discharge  Continue jardiance  25 mg ARNI and MRA on hold with hypotension [improving] and renal function  HX of Hypertension BP:125/75 Medications as above  Hyperlipidemia Lipid panel pending for am Consider switching to lipitor pending renal function in the future Continue crestor  10 mg  Per primary  CKD OSA on CPAP T2DM Obesity Anemia Electrolyte Disturbance Anxiety COPD    For questions or updates, please  contact [facility] Please consult [email or web address] for contact info under       Signed, Leontine LOISE Salen, PA-C

## 2024-08-21 NOTE — Care Management Important Message (Signed)
 Important Message  Patient Details  Name: [name]  MRN: [id number] Date of Birth: 1961/05/25   Important Message Given:  Yes - Medicare IM     Claretta Deed 08/21/2024, 3:36 PM
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
## 2024-08-21 NOTE — Progress Notes (Signed)
 PHARMACY - ANTICOAGULATION CONSULT NOTE  Pharmacy Consult for Heparin  Indication: atrial fibrillation  Allergies[1]  Patient Measurements: Height: 5' 10 (177.8 cm) Weight: 130.2 kg (287 lb 0.6 oz) IBW/kg (Calculated) : 73 HEPARIN  DW (KG): 104  Vital Signs: Temp: 98.4 F (36.9 C) (12/15 0800) Temp Source: Oral (12/15 0800) BP: 125/75 (12/15 0800) Pulse Rate: 98 (12/15 0800)  Labs: Recent Labs    08/18/24 2057 08/19/24 0407 08/19/24 1838 08/20/24 0331 08/21/24 0350  HGB  --  12.3*  --  11.5*  --   HCT  --  35.6*  --  34.0*  --   PLT  --  220  --  201  --   HEPARINUNFRC 0.58 0.80* 0.88* 0.72* 0.34  CREATININE 1.88* 1.94*  --  1.95*  --     Estimated Creatinine Clearance: 54 mL/min (A) (by C-G formula based on SCr of 1.95 mg/dL (H)).   Medical History: Past Medical History:  Diagnosis Date   Abscess of anal and rectal regions    Acute renal insufficiency    Allergy 2005   Shellfish   Anxiety    Arthritis    Asthma    Cardiac arrhythmia    Chest pain    CHF (congestive heart failure) (HCC)    COPD (chronic obstructive pulmonary disease) (HCC)    Depression    Diabetes mellitus    pre diabetic- no meds   DOE (dyspnea on exertion)    GERD (gastroesophageal reflux disease)    Hemorrhoids, internal    Hyperlipidemia    Hyperplastic colon polyp    Hypertension    IBS (irritable bowel syndrome)    Sleep apnea 2003   BPAP    Medications:  Medications Ordered Prior to Encounter[2]   Assessment: 63 y.o. male with new onset Afib for heparin .   08/21/24 AM: Heparin  level is at the lower end of the therapeutic range at 0.34 on heparin  1400 units/hr. No issues with infusion running or signs of bleeding per RN. Heparin  level was slightly supratherapeutic on 12/14 at 0.72 on 1500 units/hr. Will increase slightly to keep within therapeutic range.  Goal of Therapy:  Heparin  level 0.3-0.7 units/ml Monitor platelets by anticoagulation protocol: Yes   Plan:   Increase heparin  to 1450 units/hr Check heparin  level with AM labs tomorrow Monitor heparin  level, CBC, and s/sx of bleeding daily  B. Amon Rocher, PharmD PGY-1 Pharmacy Resident [facility] 08/21/2024 8:54 AM      [1]  Allergies Allergen Reactions   Shellfish Allergy Nausea And Vomiting   Buspar  [Buspirone ] Other (See Comments)    headache  [2]  No current facility-administered medications on file prior to encounter.   Current Outpatient Medications on File Prior to Encounter  Medication Sig Dispense Refill   acetaminophen  (TYLENOL ) 650 MG CR tablet Take 1,300 mg by mouth every 8 (eight) hours as needed for pain (chronic knee pain).     albuterol  (VENTOLIN  HFA) 108 (90 Base) MCG/ACT inhaler INHALE 2 PUFFS BY MOUTH EVERY 4 HOURS AS NEEDED FOR WHEEZE OR FOR SHORTNESS OF BREATH 8.5 each 3   ALPRAZolam  (XANAX ) 0.5 MG tablet Take 1 tablet (0.5 mg total) by mouth 2 (two) times daily as needed for anxiety. 30 tablet 5   aspirin  EC 81 MG tablet Take 81 mg by mouth in the morning. Swallow whole.     carvedilol  (COREG ) 12.5 MG tablet Take 1 tablet (12.5 mg total) by mouth 2 (two) times daily. (Patient taking differently: Take 12.5 mg  by mouth in the morning.) 180 tablet 3   empagliflozin  (JARDIANCE ) 25 MG TABS tablet Take 1 tablet (25 mg total) by mouth daily before breakfast. 90 tablet 0   fluticasone  furoate-vilanterol (BREO ELLIPTA ) 200-25 MCG/ACT AEPB Inhale 1 puff into the lungs daily. 90 each 3   furosemide  (LASIX ) 40 MG tablet Take 40 mg by mouth in the morning.     lidocaine  4 % Place 1 patch onto the skin daily.     Multiple Vitamin (MULTIVITAMIN) tablet Take 1 tablet by mouth daily.     omeprazole  (PRILOSEC) 40 MG capsule TAKE 1 CAPSULE (40 MG TOTAL) BY MOUTH DAILY. OFFICE VISIT FOR FURTHER REFILLS 90 capsule 0   Polyethyl Glyc-Propyl Glyc PF (SYSTANE HYDRATION PF) 0.4-0.3 % SOLN Place 1 drop into both eyes daily as needed (dry eyes, eye irritations).      rosuvastatin  (CRESTOR ) 10 MG tablet Take 1 tablet (10 mg total) by mouth daily. (Patient taking differently: Take 10 mg by mouth daily after supper.) 90 tablet 3   sacubitril -valsartan  (ENTRESTO ) 97-103 MG TAKE 1 TABLET BY MOUTH TWICE A DAY 180 tablet 0   traZODone  (DESYREL ) 100 MG tablet Take 1-2 tablets (100-200 mg total) by mouth at bedtime as needed. (Patient taking differently: Take 50-200 mg by mouth at bedtime.) 180 tablet 1

## 2024-08-21 NOTE — Progress Notes (Signed)
°   08/21/24 2110  BiPAP/CPAP/SIPAP  BiPAP/CPAP/SIPAP Pt Type Adult (Pt places himself on his home unit. RT will assist as needed.)  Patient Home Machine Yes  Safety Check Completed by RT for Home Unit Yes, no issues noted  Patient Home Mask Yes  Patient Home Tubing Yes

## 2024-08-21 NOTE — Progress Notes (Addendum)
 Progress Note    [name]   FMW:[id number] DOB: April 14, 1961  DOA: 08/17/2024 PCP: [name], MD      Brief Narrative:    Medical records reviewed and are as summarized below:  [name]  is a 63 y.o. 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  He was referred to the ED from the cardiology clinic because of new onset A-fib with concern for CHF exacerbation.      Assessment/Plan:   Principal Problem:   Paroxysmal atrial fibrillation (HCC) Active Problems:   Acute on chronic combined systolic and diastolic CHF (congestive heart failure) (HCC)   Hyponatremia   Essential hypertension   Morbid obesity (HCC)   Sleep apnea   GAD (generalized anxiety disorder)   Diabetes mellitus without complication (HCC)   Hyperlipidemia associated with type 2 diabetes mellitus (HCC)   History of COPD   New onset atrial fibrillation (HCC)   Body mass index is 41.19 kg/m.   Paroxysmal atrial fibrillation and atrial flutter: Continue IV heparin  drip and monitor heparin  level per protocol.  Continue metoprolol . Plan for cardioversion after renal biopsy. Patient will likely follow-up with electrophysiologist as an outpatient for consideration of ablation   Acute on chronic diastolic CHF, history of nonischemic cardiomyopathy: Improving.  Continue IV Lasix .  Monitor BMP, daily weight and urine output. Entresto  and spironolactone  on hold because of borderline BP on CKD 2D echo on 08/18/2024 showed EF estimated at 55%, indeterminate LV diastolic parameters   CKD stage IIIb, nephrotic syndrome: IR has been consulted for renal biopsy. Plan for renal biopsy tomorrow. Aspirin  on hold for biopsy   Comorbidities include OSA on CPAP, hypertension, general anxiety  disorder, hyperlipidemia, COPD, type II DM, hypoalbuminemia    Diet Order             Diet NPO time specified  Diet effective midnight           Diet heart healthy/carb modified Room service appropriate? Yes; Fluid consistency: Thin; Fluid restriction: 2000 mL Fluid  Diet effective now                                  Consultants: Cardiologist Nephrologist  Procedures: None    Medications:    empagliflozin   25 mg Oral QAC breakfast   fluticasone  furoate-vilanterol  1 puff Inhalation Daily   furosemide   40 mg Intravenous BID   insulin  aspart  0-5 Units Subcutaneous QHS   metoprolol  tartrate  25 mg Oral BID   pantoprazole   40 mg Oral Daily   rosuvastatin   10 mg Oral Daily   sodium chloride  flush  3 mL Intravenous Q12H   sodium chloride  flush  3 mL Intravenous Q12H   Continuous Infusions:  heparin  1,450 Units/hr (08/21/24 0910)     Anti-infectives (From admission, onward)    None              Family Communication/Anticipated D/C date and plan/Code Status   DVT prophylaxis: SCDs Start: 08/17/24 2347 Place TED hose Start: 08/17/24 2347     Code Status: Full Code  Family Communication: Plan discussed with his wife at the bedside Disposition Plan: Plan to discharge home   Status is: Inpatient Remains inpatient appropriate because: A-fib, CHF exacerbation  Subjective:   Interval events noted.  No complaints.  He feels better.  No shortness of breath or chest pain.  No diarrhea.  He said he had diarrhea few days ago.  He requested Imodium  to be used only as needed.  His wife at the bedside  Objective:    Vitals:   08/20/24 2342 08/21/24 0430 08/21/24 0800 08/21/24 1122  BP: 117/83 (!) 118/92 125/75 120/89  Pulse: 73 82 98 85  Resp: 17 18 17 16   Temp: 98.2 F (36.8 C) 97.6 F (36.4 C) 98.4 F (36.9 C) 98.1 F (36.7 C)  TempSrc: Oral Oral Oral Oral  SpO2: 94% 98% 97% 97%  Weight:  130.2 kg    Height:       No  data found.   Intake/Output Summary (Last 24 hours) at 08/21/2024 1131 Last data filed at 08/21/2024 9171 Gross per 24 hour  Intake 599.28 ml  Output 1950 ml  Net -1350.72 ml   Filed Weights   08/19/24 0559 08/20/24 0337 08/21/24 0430  Weight: 132.2 kg 131.8 kg 130.2 kg    Exam:  GEN: NAD SKIN: Warm and dry EYES: No pallor or icterus ENT: MMM CV: RRR PULM: CTA B ABD: soft, distended, NT, +BS CNS: AAO x 3, non focal EXT: Swelling of bilateral upper and lower extremities (slowly improving)      Data Reviewed:   I have personally reviewed following labs and imaging studies:  Labs: Labs show the following:   Basic Metabolic Panel: Recent Labs  Lab 08/18/24 0920 08/18/24 1752 08/18/24 2057 08/19/24 0407 08/20/24 0331  NA 130* 130* 131* 130* 133*  K 4.1 5.1 4.4 4.2 4.0  CL 100 101 101 103 102  CO2 23 22 26 22  19*  GLUCOSE 126* 99 93 91 82  BUN 31* 30* 29* 28* 28*  CREATININE 1.95* 1.99* 1.88* 1.94* 1.95*  CALCIUM  7.6* 7.7* 8.2* 8.0* 7.8*   GFR Estimated Creatinine Clearance: 54 mL/min (A) (by C-G formula based on SCr of 1.95 mg/dL (H)). Liver Function Tests: Recent Labs  Lab 08/17/24 1603 08/18/24 0445  AST 25 20  ALT 20 16  ALKPHOS 82 68  BILITOT 0.5 0.5  PROT 5.1* 4.6*  ALBUMIN  <1.5* 1.5*   No results for input(s): LIPASE, AMYLASE in the last 168 hours. No results for input(s): AMMONIA in the last 168 hours. Coagulation profile Recent Labs  Lab 08/17/24 1603  INR 1.0    CBC: Recent Labs  Lab 08/17/24 1603 08/18/24 0307 08/19/24 0407 08/20/24 0331  WBC 7.5 7.4 7.4 6.9  NEUTROABS 5.3  --   --   --   HGB 13.8 12.4* 12.3* 11.5*  HCT 40.2 35.9* 35.6* 34.0*  MCV 93.3 93.2 92.5 94.2  PLT 270 252 220 201   Cardiac Enzymes: No results for input(s): CKTOTAL, CKMB, CKMBINDEX, TROPONINI in the last 168 hours. BNP (last 3 results) No results for input(s): PROBNP in the last 8760 hours. CBG: Recent Labs  Lab 08/20/24 1055  08/20/24 1620 08/20/24 2129 08/21/24 0636 08/21/24 1124  GLUCAP 95 85 99 81 102*   D-Dimer: No results for input(s): DDIMER in the last 72 hours. Hgb A1c: No results for input(s): HGBA1C in the last 72 hours. Lipid Profile: No results for input(s): CHOL, HDL, LDLCALC, TRIG, CHOLHDL, LDLDIRECT in the last 72 hours. Thyroid  function studies: No results for input(s): TSH, T4TOTAL, T3FREE, THYROIDAB in the last 72 hours.  Invalid input(s): FREET3  Anemia work up: No results for input(s): VITAMINB12, FOLATE,  FERRITIN, TIBC, IRON, RETICCTPCT in the last 72 hours. Sepsis Labs: Recent Labs  Lab 08/17/24 1603 08/18/24 0307 08/19/24 0407 08/20/24 0331  WBC 7.5 7.4 7.4 6.9    Microbiology Recent Results (from the past 240 hours)  MRSA Next Gen by PCR, Nasal     Status: None   Collection Time: 08/18/24  4:49 PM   Specimen: Nasal Mucosa; Nasal Swab  Result Value Ref Range Status   MRSA by PCR Next Gen NOT DETECTED NOT DETECTED Final    Comment: (NOTE) The GeneXpert MRSA Assay (FDA approved for NASAL specimens only), is one component of a comprehensive MRSA colonization surveillance program. It is not intended to diagnose MRSA infection nor to guide or monitor treatment for MRSA infections. Test performance is not FDA approved in patients less than 35 years old. Performed at [facility], [address] [place], [place] [address]     Procedures and diagnostic studies:  No results found.             LOS: 4 days   Cleven Jansma  [facility]   Pager on [email or web address]. If 7PM-7AM, please contact night-coverage at [email or web address]     08/21/2024, 11:31 AM

## 2024-08-21 NOTE — Progress Notes (Addendum)
 [facility] Progress Note    Assessment/ Plan:   CKD3b, nonoliguric Nephrotic Syndrome -followed by Dr. Norine. Serological work up as an OP unrevealing thus far (including PLA2R). Pending renal biopsy which is to be performed before cardioversion given the need for anticoagulation 4 weeks thereafter. IR consult pending. Will tentatively place him NPO after midnight -no labs today but renal function stable yesterday, can follow up labs tomorrow -Avoid nephrotoxic medications including NSAIDs and iodinated intravenous contrast exposure unless the latter is absolutely indicated.  Preferred narcotic agents for pain control are hydromorphone, fentanyl , and methadone. Morphine should not be used. Avoid Baclofen and avoid oral sodium phosphate and magnesium citrate based laxatives / bowel preps. Continue strict Input and Output monitoring. Will monitor the patient closely with you and intervene or adjust therapy as indicated by changes in clinical status/labs   A Flutter -cardiology following, on heparin  gtt and PO metoprolol   Acute on chronic diastolic CHF -continue with lasix  40mg  IV BID, can likely transition to torsemide very soon. Continue with jardiance  -daily weights, strict I/O  HTN -BP acceptable  Anemia -Hgb acceptable, monitor for now  Hyponatremia -mild, secondary to hypervolemia, diuresis as above  Discussed with wife at the bedside.  Subjective:   Patient seen and examined bedside. No acute events. He reports that his swelling is better today. Weight down to 130kg. Already ate this AM. Waiting on renal biopsy   Objective:   BP 125/75 (BP Location: Left Arm)   Pulse 98   Temp 98.4 F (36.9 C) (Oral)   Resp 17   Ht 5' 10 (1.778 m)   Wt 130.2 kg   SpO2 97%   BMI 41.19 kg/m   Intake/Output Summary (Last 24 hours) at 08/21/2024 0815 Last data filed at 08/21/2024 9562 Gross per 24 hour  Intake 938.16 ml  Output 2325 ml  Net -1386.84 ml   Weight  change: -1.615 kg  Physical Exam: Gen: NAD CVS: irreg irreg Resp: normal wob, unlabored Abd: obese, soft Ext: trace to 1+ pitting edema b/l Les Neuro: awake, alert  Imaging: No results found.  Labs: BMET Recent Labs  Lab 08/17/24 1603 08/18/24 0445 08/18/24 0920 08/18/24 1752 08/18/24 2057 08/19/24 0407 08/20/24 0331  NA 126* 131* 130* 130* 131* 130* 133*  K 4.8 4.3 4.1 5.1 4.4 4.2 4.0  CL 95* 103 100 101 101 103 102  CO2 22 22 23 22 26 22  19*  GLUCOSE 83 81 126* 99 93 91 82  BUN 31* 30* 31* 30* 29* 28* 28*  CREATININE 2.20* 1.81* 1.95* 1.99* 1.88* 1.94* 1.95*  CALCIUM  7.8* 7.2* 7.6* 7.7* 8.2* 8.0* 7.8*   CBC Recent Labs  Lab 08/17/24 1603 08/18/24 0307 08/19/24 0407 08/20/24 0331  WBC 7.5 7.4 7.4 6.9  NEUTROABS 5.3  --   --   --   HGB 13.8 12.4* 12.3* 11.5*  HCT 40.2 35.9* 35.6* 34.0*  MCV 93.3 93.2 92.5 94.2  PLT 270 252 220 201    Medications:     empagliflozin   25 mg Oral QAC breakfast   fluticasone  furoate-vilanterol  1 puff Inhalation Daily   furosemide   40 mg Intravenous BID   insulin  aspart  0-5 Units Subcutaneous QHS   metoprolol  tartrate  25 mg Oral BID   pantoprazole   40 mg Oral Daily   rosuvastatin   10 mg Oral Daily   sodium chloride  flush  3 mL Intravenous Q12H   sodium chloride  flush  3 mL Intravenous Q12H  Ephriam Stank, MD [facility] 08/21/2024, 8:15 AM

## 2024-08-21 NOTE — Plan of Care (Signed)

## 2024-08-22 ENCOUNTER — Telehealth: Payer: Self-pay | Admitting: Cardiology

## 2024-08-22 ENCOUNTER — Telehealth (HOSPITAL_COMMUNITY): Payer: Self-pay

## 2024-08-22 ENCOUNTER — Inpatient Hospital Stay (HOSPITAL_COMMUNITY)

## 2024-08-22 ENCOUNTER — Other Ambulatory Visit (HOSPITAL_COMMUNITY): Payer: Self-pay

## 2024-08-22 DIAGNOSIS — I483 Typical atrial flutter: Secondary | ICD-10-CM | POA: Diagnosis not present

## 2024-08-22 LAB — GLUCOSE, CAPILLARY
Glucose-Capillary: 96 mg/dL (ref 70–99)
Glucose-Capillary: 81 mg/dL (ref 70–99)
Glucose-Capillary: 111 mg/dL — ABNORMAL HIGH (ref 70–99)
Glucose-Capillary: 101 mg/dL — ABNORMAL HIGH (ref 70–99)

## 2024-08-22 LAB — CBC
HCT: 38.7 % — ABNORMAL LOW (ref 39.0–52.0)
Hemoglobin: 13 g/dL (ref 13.0–17.0)
MCH: 31.6 pg (ref 26.0–34.0)
MCHC: 33.6 g/dL (ref 30.0–36.0)
MCV: 94.2 fL (ref 80.0–100.0)
Platelets: 200 K/uL (ref 150–400)
RBC: 4.11 MIL/uL — ABNORMAL LOW (ref 4.22–5.81)
RDW: 13.2 % (ref 11.5–15.5)
WBC: 5.5 K/uL (ref 4.0–10.5)
nRBC: 0 % (ref 0.0–0.2)

## 2024-08-22 LAB — RENAL FUNCTION PANEL
Albumin: 2.4 g/dL — ABNORMAL LOW (ref 3.5–5.0)
Anion gap: 10 (ref 5–15)
BUN: 26 mg/dL — ABNORMAL HIGH (ref 8–23)
CO2: 23 mmol/L (ref 22–32)
Calcium: 8.7 mg/dL — ABNORMAL LOW (ref 8.9–10.3)
Chloride: 102 mmol/L (ref 98–111)
Creatinine, Ser: 2.05 mg/dL — ABNORMAL HIGH (ref 0.61–1.24)
GFR, Estimated: 36 mL/min — ABNORMAL LOW (ref >60)
Glucose, Bld: 87 mg/dL (ref 70–99)
Phosphorus: 3.8 mg/dL (ref 2.5–4.6)
Potassium: 4.2 mmol/L (ref 3.5–5.1)
Sodium: 135 mmol/L (ref 135–145)

## 2024-08-22 LAB — LIPID PANEL
Cholesterol: 291 mg/dL — ABNORMAL HIGH (ref 0–200)
HDL: 125 mg/dL (ref >40)
LDL Cholesterol: 146 mg/dL — ABNORMAL HIGH (ref 0–99)
Total CHOL/HDL Ratio: 2.3 ratio
Triglycerides: 101 mg/dL (ref <150)
VLDL: 20 mg/dL (ref 0–40)

## 2024-08-22 LAB — PROTIME-INR
INR: 0.9 (ref 0.8–1.2)
Prothrombin Time: 12.8 s (ref 11.4–15.2)

## 2024-08-22 MED ORDER — MIDAZOLAM HCL (PF) 2 MG/2ML IJ SOLN
INTRAMUSCULAR | Status: AC | PRN
  Administered 2024-08-22 (×2): .5 mg via INTRAVENOUS
  Administered 2024-08-22 (×2): 1 mg via INTRAVENOUS

## 2024-08-22 MED ORDER — MIDAZOLAM HCL 2 MG/2ML IJ SOLN
INTRAMUSCULAR | Status: AC
  Filled 2024-08-22: qty 4

## 2024-08-22 MED ORDER — FUROSEMIDE 40 MG PO TABS
40.0000 mg | ORAL_TABLET | Freq: Two times a day (BID) | ORAL | Status: DC
  Administered 2024-08-22 – 2024-08-23 (×2): 40 mg via ORAL
  Filled 2024-08-22 (×2): qty 1

## 2024-08-22 MED ORDER — FENTANYL CITRATE (PF) 100 MCG/2ML IJ SOLN
INTRAMUSCULAR | Status: AC | PRN
  Administered 2024-08-22 (×2): 50 ug via INTRAVENOUS

## 2024-08-22 MED ORDER — FENTANYL CITRATE (PF) 100 MCG/2ML IJ SOLN
INTRAMUSCULAR | Status: AC
  Filled 2024-08-22: qty 4

## 2024-08-22 MED ORDER — LIDOCAINE 1 % OPTIME INJ - NO CHARGE
10.0000 mL | Freq: Once | INTRAMUSCULAR | Status: AC
  Administered 2024-08-22: 10:00:00 10 mL

## 2024-08-22 MED ORDER — APIXABAN 5 MG PO TABS
5.0000 mg | ORAL_TABLET | Freq: Two times a day (BID) | ORAL | Status: DC
  Administered 2024-08-23: 09:00:00 5 mg via ORAL
  Filled 2024-08-22: qty 1

## 2024-08-22 NOTE — Telephone Encounter (Signed)
 Carmine  from Pharm rep calling to see if Dr Pietro thinks pt should continue Heparin  medication. Please advise.

## 2024-08-22 NOTE — Telephone Encounter (Signed)
 Secure chat hospital pharmacist regarding call. Gave pager number to contact MD.

## 2024-08-22 NOTE — Progress Notes (Signed)
 Rounding Note   Patient Name: [name]  Date of Encounter: 08/22/2024  [facility] Cardiologist: Redell Shallow, MD   Subjective No CP or dyspnea  Scheduled Meds:  empagliflozin   25 mg Oral QAC breakfast   fluticasone  furoate-vilanterol  1 puff Inhalation Daily   furosemide   40 mg Intravenous BID   insulin  aspart  0-5 Units Subcutaneous QHS   metoprolol  tartrate  25 mg Oral BID   pantoprazole   40 mg Oral Daily   rosuvastatin   10 mg Oral Daily   sodium chloride  flush  3 mL Intravenous Q12H   sodium chloride  flush  3 mL Intravenous Q12H   Continuous Infusions:   PRN Meds: acetaminophen  **OR** acetaminophen , ALPRAZolam , insulin  aspart, ipratropium-albuterol , loperamide , ondansetron  **OR** ondansetron  (ZOFRAN ) IV, sodium chloride  flush, traZODone    Vital Signs  Vitals:   08/22/24 0955 08/22/24 1000 08/22/24 1005 08/22/24 1017  BP: 114/79 107/81 109/77 (!) 114/90  Pulse: 88 89 88 95  Resp: 20 18 13 18   Temp:      TempSrc:      SpO2: 100% 100% 99% 96%  Weight:      Height:        Intake/Output Summary (Last 24 hours) at 08/22/2024 1027 Last data filed at 08/22/2024 0300 Gross per 24 hour  Intake 698.29 ml  Output 1400 ml  Net -701.71 ml      08/22/2024    5:00 AM 08/21/2024    4:30 AM 08/20/2024    3:37 AM  Last 3 Weights  Weight (lbs) 286 lb 9.6 oz 287 lb 0.6 oz 290 lb 9.6 oz  Weight (kg) 130 kg 130.2 kg 131.815 kg      Telemetry Atrial flutter with variable conduction; rate controlled- Personally Reviewed   Physical Exam  GEN: NAD, WD Neck: supple, no TM Cardiac: irregular Respiratory: CTA; no wheeze GI: Soft, NT/ND MS: trace edema Neuro:  No focal findings Psych: Normal affect   Labs High Sensitivity Troponin:   Recent Labs  Lab 08/17/24 1603 08/17/24 1940  TROPONINIHS 3 <2      Chemistry Recent Labs  Lab 08/17/24 1603 08/18/24 0445 08/18/24 0920 08/19/24 0407 08/20/24 0331 08/21/24 1022  NA 126* 131*   < >  130* 133* 134*  K 4.8 4.3   < > 4.2 4.0 4.1  CL 95* 103   < > 103 102 105  CO2 22 22   < > 22 19* 23  GLUCOSE 83 81   < > 91 82 86  BUN 31* 30*   < > 28* 28* 28*  CREATININE 2.20* 1.81*   < > 1.94* 1.95* 2.13*  CALCIUM  7.8* 7.2*   < > 8.0* 7.8* 7.6*  PROT 5.1* 4.6*  --   --   --  4.9*  ALBUMIN  <1.5* 1.5*  --   --   --  1.6*  AST 25 20  --   --   --  32  ALT 20 16  --   --   --  20  ALKPHOS 82 68  --   --   --  58  BILITOT 0.5 0.5  --   --   --  0.7  GFRNONAA 33* 42*   < > 39* 38* 35*  ANIONGAP 9 6   < > 5 12 6    < > = values in this interval not displayed.     Hematology Recent Labs  Lab 08/19/24 0407 08/20/24 0331 08/22/24 0830  WBC 7.4 6.9 5.5  RBC 3.85* 3.61* 4.11*  HGB 12.3* 11.5* 13.0  HCT 35.6* 34.0* 38.7*  MCV 92.5 94.2 94.2  MCH 31.9 31.9 31.6  MCHC 34.6 33.8 33.6  RDW 13.3 13.4 13.2  PLT 220 201 200   Thyroid   Recent Labs  Lab 08/17/24 2344  TSH 1.766    BNP Recent Labs  Lab 08/17/24 1601 08/18/24 0307  BNP 113.3* 63.1      Patient Profile   [name]  is a 63 y.o. 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  Echocardiogram this admission shows normal LV function, mild left ventricular hypertrophy, mild biatrial enlargement.  CTA showed no pulmonary embolus.  Assessment & Plan  1 atrial flutter-patient remains in atrial flutter.  Continue metoprolol .  Rate is controlled.  He is now status post renal biopsy.  Resume anticoagulation when okay with nephrology.  Once anticoagulation resumed will have patient ambulate and if he is asymptomatic plan cardioversion in 3 to 4 weeks.  If he is symptomatic or heart rate difficult to control can proceed with TEE guided cardioversion.    2 acute on chronic diastolic congestive heart failure-volume status has improved.  Change Lasix  to  40 mg by mouth twice daily.  3 history of nonischemic cardiomyopathy-continue Jardiance , beta-blocker and Lasix .  LV function is now normal.  4 chronic stage III kidney disease-now status post renal biopsy.  Continue to follow while on diuretics.  5 hypertension-patient's blood pressure is controlled.  Continue present medications.  For questions or updates, please contact [facility] Please consult [email or web address] for contact info under       Signed, Redell Shallow, MD  08/22/2024, 10:27 AM

## 2024-08-22 NOTE — Progress Notes (Signed)
 [facility] Progress Note    Assessment/ Plan:   CKD3b, nonoliguric Nephrotic Syndrome -followed by Dr. Norine. Serological work up as an OP unrevealing thus far (including PLA2R). Baseline Cr around 1.8-2.2. Pending renal biopsy which is to be performed before cardioversion given the need for anticoagulation 4 weeks thereafter. Open for renal biopsy today with IR -Cr slightly increased today but overall within his baseline range, will continue with lasix  40mg  IV BID today -Avoid nephrotoxic medications including NSAIDs and iodinated intravenous contrast exposure unless the latter is absolutely indicated.  Preferred narcotic agents for pain control are hydromorphone, fentanyl , and methadone. Morphine should not be used. Avoid Baclofen and avoid oral sodium phosphate and magnesium citrate based laxatives / bowel preps. Continue strict Input and Output monitoring. Will monitor the patient closely with you and intervene or adjust therapy as indicated by changes in clinical status/labs   A Flutter -cardiology following, on heparin  gtt and PO metoprolol   Acute on chronic diastolic CHF -continue with lasix  40mg  IV BID, can likely transition to torsemide soon. Continue with jardiance  -daily weights, strict I/O  HTN -BP acceptable  Anemia -Hgb acceptable, monitor for now  Hyponatremia -mild, secondary to hypervolemia, diuresis as above. Na up to 134  Discussed with wife at the bedside.  Subjective:   Patient seen and examined bedside. No acute events. He reports that the swelling in his legs are improving but arms are still swollen. Currently NPO for renal biopsyu today Weight 130.2->130kg Uop ~1.5L   Objective:   BP 124/84 (BP Location: Left Arm)   Pulse 88   Temp 97.8 F (36.6 C) (Oral)   Resp 20   Ht 5' 10 (1.778 m)   Wt 130 kg   SpO2 99%   BMI 41.12 kg/m   Intake/Output Summary (Last 24 hours) at 08/22/2024 0737 Last data filed at 08/22/2024 0300 Gross  per 24 hour  Intake 698.29 ml  Output 1575 ml  Net -876.71 ml   Weight change: -0.2 kg  Physical Exam: Gen: NAD, sitting up in bed CVS: irreg irreg Resp: normal wob, unlabored, cta bl Abd: obese, soft Ext: trace edema b/l Les, b/l UE edematous  Neuro: awake, alert  Imaging: No results found.  Labs: BMET Recent Labs  Lab 08/18/24 0445 08/18/24 0920 08/18/24 1752 08/18/24 2057 08/19/24 0407 08/20/24 0331 08/21/24 1022  NA 131* 130* 130* 131* 130* 133* 134*  K 4.3 4.1 5.1 4.4 4.2 4.0 4.1  CL 103 100 101 101 103 102 105  CO2 22 23 22 26 22  19* 23  GLUCOSE 81 126* 99 93 91 82 86  BUN 30* 31* 30* 29* 28* 28* 28*  CREATININE 1.81* 1.95* 1.99* 1.88* 1.94* 1.95* 2.13*  CALCIUM  7.2* 7.6* 7.7* 8.2* 8.0* 7.8* 7.6*   CBC Recent Labs  Lab 08/17/24 1603 08/18/24 0307 08/19/24 0407 08/20/24 0331  WBC 7.5 7.4 7.4 6.9  NEUTROABS 5.3  --   --   --   HGB 13.8 12.4* 12.3* 11.5*  HCT 40.2 35.9* 35.6* 34.0*  MCV 93.3 93.2 92.5 94.2  PLT 270 252 220 201    Medications:     empagliflozin   25 mg Oral QAC breakfast   fluticasone  furoate-vilanterol  1 puff Inhalation Daily   furosemide   40 mg Intravenous BID   insulin  aspart  0-5 Units Subcutaneous QHS   metoprolol  tartrate  25 mg Oral BID   pantoprazole   40 mg Oral Daily   rosuvastatin   10 mg Oral Daily   sodium  chloride flush  3 mL Intravenous Q12H   sodium chloride  flush  3 mL Intravenous Q12H      Ephriam Stank, MD [facility] 08/22/2024, 7:37 AM

## 2024-08-22 NOTE — Procedures (Signed)
 Pre procedural Dx: Proteinuria  Post procedural Dx: Same  Technically successful CT guided biopsy of kidney (left)    EBL: None.   Complications: None immediate.   KANDICE Banner, MD Pager #: [phone]

## 2024-08-22 NOTE — Telephone Encounter (Signed)
 Pharmacy Patient Advocate Encounter  Insurance verification completed.    The patient is insured through [employer]. Patient has Medicare and is not eligible for a copay card, but may be able to apply for patient assistance or Medicare RX Payment Plan (Patient Must reach out to their plan, if eligible for payment plan), if available.    Ran test claim for Xarelto 20mg  tablet and the current 30 day co-pay is $114.48.   This test claim was processed through [facility]- copay amounts may vary at other pharmacies due to pharmacy/plan contracts, or as the patient moves through the different stages of their insurance plan.

## 2024-08-22 NOTE — Discharge Instructions (Signed)
 Information on my medicine - ELIQUIS  (apixaban )  This medication education was reviewed with me or my healthcare representative as part of my discharge preparation.  The pharmacist that spoke with me during my hospital stay was:    Why was Eliquis  prescribed for you? Eliquis  was prescribed for you to reduce the risk of forming blood clots that can cause a stroke if you have a medical condition called atrial fibrillation (a type of irregular heartbeat) OR to reduce the risk of a blood clots forming after orthopedic surgery.  What do You need to know about Eliquis  ? Take your Eliquis  TWICE DAILY - one tablet in the morning and one tablet in the evening with or without food.  It would be best to take the doses about the same time each day.  If you have difficulty swallowing the tablet whole please discuss with your pharmacist how to take the medication safely.  Take Eliquis  exactly as prescribed by your doctor and DO NOT stop taking Eliquis  without talking to the doctor who prescribed the medication.  Stopping may increase your risk of developing a new clot or stroke.  Refill your prescription before you run out.  After discharge, you should have regular check-up appointments with your healthcare provider that is prescribing your Eliquis .  In the future your dose may need to be changed if your kidney function or weight changes by a significant amount or as you get older.  What do you do if you miss a dose? If you miss a dose, take it as soon as you remember on the same day and resume taking twice daily.  Do not take more than one dose of ELIQUIS  at the same time.  Important Safety Information A possible side effect of Eliquis  is bleeding. You should call your healthcare provider right away if you experience any of the following: Bleeding from an injury or your nose that does not stop. Unusual colored urine (red or dark brown) or unusual colored stools (red or black). Unusual bruising for  unknown reasons. A serious fall or if you hit your head (even if there is no bleeding).  Some medicines may interact with Eliquis  and might increase your risk of bleeding or clotting while on Eliquis . To help avoid this, consult your healthcare provider or pharmacist prior to using any new prescription or non-prescription medications, including herbals, vitamins, non-steroidal anti-inflammatory drugs (NSAIDs) and supplements.  This website has more information on Eliquis  (apixaban ): [email or web address] .[email or web address] dena

## 2024-08-22 NOTE — Progress Notes (Signed)
 Progress Note    [name]   FMW:[id number] DOB: [birth year]  DOA: 08/17/2024 PCP: [name], MD      Brief Narrative:    Medical records reviewed and are as summarized below:  [name]  is a 63 y.o. 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  He was referred to the ED from the cardiology clinic because of new onset A-fib with concern for CHF exacerbation.      Assessment/Plan:   Principal Problem:   Paroxysmal atrial fibrillation (HCC) Active Problems:   Acute on chronic combined systolic and diastolic CHF (congestive heart failure) (HCC)   Hyponatremia   Essential hypertension   Morbid obesity (HCC)   Sleep apnea   GAD (generalized anxiety disorder)   Diabetes mellitus without complication (HCC)   Hyperlipidemia associated with type 2 diabetes mellitus (HCC)   History of COPD   New onset atrial fibrillation (HCC)   Body mass index is 41.12 kg/m.   Paroxysmal atrial fibrillation and atrial flutter: Rate controlled.  Continue metoprolol .   Plan to start Eliquis  tomorrow per  cardiologist. Plan for TEE and cardioversion in 3 to 4 weeks as an outpatient. Patient will likely follow-up with electrophysiologist as an outpatient for consideration of ablation   Acute on chronic diastolic CHF, history of nonischemic cardiomyopathy: Improving.  Change IV to oral Lasix  per cardiologist. Entresto  and spironolactone  on hold because of borderline BP on CKD 2D echo on 08/18/2024 showed EF estimated at 55%, indeterminate LV diastolic parameters   CKD stage IIIb, nephrotic syndrome: S/p CT-guided left kidney biopsy on 08/22/2024.   Comorbidities include OSA on CPAP, hypertension, general anxiety disorder, hyperlipidemia, COPD, type II DM,  hypoalbuminemia    Diet Order             Diet heart healthy/carb modified Room service appropriate? Yes; Fluid consistency: Thin  Diet effective now                                  Consultants: Cardiologist Nephrologist  Procedures: CT-guided left kidney biopsy on 08/22/2024    Medications:    [START ON 08/23/2024] apixaban   5 mg Oral BID   empagliflozin   25 mg Oral QAC breakfast   fluticasone  furoate-vilanterol  1 puff Inhalation Daily   furosemide   40 mg Intravenous BID   insulin  aspart  0-5 Units Subcutaneous QHS   metoprolol  tartrate  25 mg Oral BID   pantoprazole   40 mg Oral Daily   rosuvastatin   10 mg Oral Daily   sodium chloride  flush  3 mL Intravenous Q12H   sodium chloride  flush  3 mL Intravenous Q12H   Continuous Infusions:     Anti-infectives (From admission, onward)    None              Family Communication/Anticipated D/C date and plan/Code Status   DVT prophylaxis: SCDs Start: 08/17/24 2347 Place TED hose Start: 08/17/24 2347 apixaban  (ELIQUIS ) tablet 5 mg     Code Status: Full Code  Family Communication: Plan discussed with his wife at the bedside Disposition Plan: Plan to discharge home   Status is: Inpatient Remains inpatient appropriate because: A-fib, CHF exacerbation       Subjective:   Interval events noted.  No complaints.  He feels  better.  No shortness of breath or chest pain.  No diarrhea.  He said he had diarrhea few days ago.  He requested Imodium  to be used only as needed.  His wife at the bedside  Objective:    Vitals:   08/22/24 1000 08/22/24 1005 08/22/24 1017 08/22/24 1028  BP: 107/81 109/77 (!) 114/90 125/79  Pulse: 89 88 95 97  Resp: 18 13 18 19   Temp:    97.7 F (36.5 C)  TempSrc:    Oral  SpO2: 100% 99% 96%   Weight:      Height:       No data found.   Intake/Output Summary (Last 24 hours) at 08/22/2024 1153 Last data filed at 08/22/2024 1112 Gross per 24 hour   Intake 938.29 ml  Output 1400 ml  Net -461.71 ml   Filed Weights   08/20/24 0337 08/21/24 0430 08/22/24 0500  Weight: 131.8 kg 130.2 kg 130 kg    Exam:  GEN: NAD SKIN: Warm and dry EYES: No pallor or icterus ENT: MMM CV: RRR PULM: CTA B ABD: soft, distended, NT, +BS CNS: AAO x 3, non focal EXT: Swelling of bilateral upper and lower extremities (slowly improving)      Data Reviewed:   I have personally reviewed following labs and imaging studies:  Labs: Labs show the following:   Basic Metabolic Panel: Recent Labs  Lab 08/18/24 1752 08/18/24 2057 08/19/24 0407 08/20/24 0331 08/21/24 1022  NA 130* 131* 130* 133* 134*  K 5.1 4.4 4.2 4.0 4.1  CL 101 101 103 102 105  CO2 22 26 22  19* 23  GLUCOSE 99 93 91 82 86  BUN 30* 29* 28* 28* 28*  CREATININE 1.99* 1.88* 1.94* 1.95* 2.13*  CALCIUM  7.7* 8.2* 8.0* 7.8* 7.6*   GFR Estimated Creatinine Clearance: 49.3 mL/min (A) (by C-G formula based on SCr of 2.13 mg/dL (H)). Liver Function Tests: Recent Labs  Lab 08/17/24 1603 08/18/24 0445 08/21/24 1022  AST 25 20 32  ALT 20 16 20   ALKPHOS 82 68 58  BILITOT 0.5 0.5 0.7  PROT 5.1* 4.6* 4.9*  ALBUMIN  <1.5* 1.5* 1.6*   No results for input(s): LIPASE, AMYLASE in the last 168 hours. No results for input(s): AMMONIA in the last 168 hours. Coagulation profile Recent Labs  Lab 08/17/24 1603 08/22/24 0830  INR 1.0 0.9    CBC: Recent Labs  Lab 08/17/24 1603 08/18/24 0307 08/19/24 0407 08/20/24 0331 08/22/24 0830  WBC 7.5 7.4 7.4 6.9 5.5  NEUTROABS 5.3  --   --   --   --   HGB 13.8 12.4* 12.3* 11.5* 13.0  HCT 40.2 35.9* 35.6* 34.0* 38.7*  MCV 93.3 93.2 92.5 94.2 94.2  PLT 270 252 220 201 200   Cardiac Enzymes: No results for input(s): CKTOTAL, CKMB, CKMBINDEX, TROPONINI in the last 168 hours. BNP (last 3 results) No results for input(s): PROBNP in the last 8760 hours. CBG: Recent Labs  Lab 08/21/24 0636 08/21/24 1124 08/21/24 1609  08/21/24 2126 08/22/24 0634  GLUCAP 81 102* 90 100* 96   D-Dimer: No results for input(s): DDIMER in the last 72 hours. Hgb A1c: No results for input(s): HGBA1C in the last 72 hours. Lipid Profile: No results for input(s): CHOL, HDL, LDLCALC, TRIG, CHOLHDL, LDLDIRECT in the last 72 hours. Thyroid  function studies: No results for input(s): TSH, T4TOTAL, T3FREE, THYROIDAB in the last 72 hours.  Invalid input(s): FREET3  Anemia work up: No results for input(s): VITAMINB12, FOLATE, FERRITIN,  TIBC, IRON, RETICCTPCT in the last 72 hours. Sepsis Labs: Recent Labs  Lab 08/18/24 0307 08/19/24 0407 08/20/24 0331 08/22/24 0830  WBC 7.4 7.4 6.9 5.5    Microbiology Recent Results (from the past 240 hours)  MRSA Next Gen by PCR, Nasal     Status: None   Collection Time: 08/18/24  4:49 PM   Specimen: Nasal Mucosa; Nasal Swab  Result Value Ref Range Status   MRSA by PCR Next Gen NOT DETECTED NOT DETECTED Final    Comment: (NOTE) The GeneXpert MRSA Assay (FDA approved for NASAL specimens only), is one component of a comprehensive MRSA colonization surveillance program. It is not intended to diagnose MRSA infection nor to guide or monitor treatment for MRSA infections. Test performance is not FDA approved in patients less than 47 years old. Performed at [facility], [address], [place], [place] [address]     Procedures and diagnostic studies:  No results found.             LOS: 5 days   Osmond Steckman  [facility]   Pager on [email or web address]. If 7PM-7AM, please contact night-coverage at [email or web address]     08/22/2024, 11:53 AM

## 2024-08-22 NOTE — Plan of Care (Signed)
°  Problem: Education: Goal: Ability to describe self-care measures that may prevent or decrease complications (Diabetes Survival Skills Education) will improve Outcome: Progressing Goal: Individualized Educational Video(s) Outcome: Progressing   Problem: Coping: Goal: Ability to adjust to condition or change in health will improve Outcome: Progressing   Problem: Fluid Volume: Goal: Ability to maintain a balanced intake and output will improve Outcome: Progressing   Problem: Health Behavior/Discharge Planning: Goal: Ability to identify and utilize available resources and services will improve Outcome: Progressing Goal: Ability to manage health-related needs will improve Outcome: Progressing   Problem: Metabolic: Goal: Ability to maintain appropriate glucose levels will improve Outcome: Progressing   Problem: Nutritional: Goal: Maintenance of adequate nutrition will improve Outcome: Progressing Goal: Progress toward achieving an optimal weight will improve Outcome: Progressing   Problem: Skin Integrity: Goal: Risk for impaired skin integrity will decrease Outcome: Progressing   Problem: Tissue Perfusion: Goal: Adequacy of tissue perfusion will improve Outcome: Progressing   Problem: Education: Goal: Knowledge of General Education information will improve Description: Including pain rating scale, medication(s)/side effects and non-pharmacologic comfort measures Outcome: Progressing   Problem: Health Behavior/Discharge Planning: Goal: Ability to manage health-related needs will improve Outcome: Progressing   Problem: Clinical Measurements: Goal: Ability to maintain clinical measurements within normal limits will improve Outcome: Progressing Goal: Will remain free from infection Outcome: Progressing Goal: Diagnostic test results will improve Outcome: Progressing Goal: Respiratory complications will improve Outcome: Progressing Goal: Cardiovascular complication will  be avoided Outcome: Progressing   Problem: Activity: Goal: Risk for activity intolerance will decrease Outcome: Progressing   Problem: Nutrition: Goal: Adequate nutrition will be maintained Outcome: Progressing   Problem: Coping: Goal: Level of anxiety will decrease Outcome: Progressing   Problem: Elimination: Goal: Will not experience complications related to bowel motility Outcome: Progressing Goal: Will not experience complications related to urinary retention Outcome: Progressing   Problem: Pain Managment: Goal: General experience of comfort will improve and/or be controlled Outcome: Progressing   Problem: Skin Integrity: Goal: Risk for impaired skin integrity will decrease Outcome: Progressing   Problem: Education: Goal: Ability to demonstrate management of disease process will improve Outcome: Progressing Goal: Ability to verbalize understanding of medication therapies will improve Outcome: Progressing Goal: Individualized Educational Video(s) Outcome: Progressing

## 2024-08-22 NOTE — Telephone Encounter (Signed)
 Pharmacy Patient Advocate Encounter  Insurance verification completed.    The patient is insured through [employer]. Patient has Medicare and is not eligible for a copay card, but may be able to apply for patient assistance or Medicare RX Payment Plan (Patient Must reach out to their plan, if eligible for payment plan), if available.    Ran test claim for Eliquis  5mg  tablet and the current 30 day co-pay is $116.06.   This test claim was processed through [facility]- copay amounts may vary at other pharmacies due to pharmacy/plan contracts, or as the patient moves through the different stages of their insurance plan.

## 2024-08-22 NOTE — Progress Notes (Signed)
 Heparin  stopped at 4AM as ordered.

## 2024-08-22 NOTE — Plan of Care (Signed)
°  Problem: Health Behavior/Discharge Planning: Goal: Ability to identify and utilize available resources and services will improve Outcome: Progressing Goal: Ability to manage health-related needs will improve Outcome: Progressing  Problem: Activity: Goal: Risk for activity intolerance will decrease Outcome: Progressing   Problem: Safety: Goal: Ability to remain free from injury will improve Outcome: Progressing   Problem: Clinical Measurements: Goal: Cardiovascular complication will be avoided Outcome: Progressing

## 2024-08-23 ENCOUNTER — Other Ambulatory Visit (HOSPITAL_COMMUNITY): Payer: Self-pay

## 2024-08-23 ENCOUNTER — Other Ambulatory Visit: Payer: Self-pay

## 2024-08-23 DIAGNOSIS — I5043 Acute on chronic combined systolic (congestive) and diastolic (congestive) heart failure: Secondary | ICD-10-CM

## 2024-08-23 DIAGNOSIS — I483 Typical atrial flutter: Secondary | ICD-10-CM | POA: Diagnosis not present

## 2024-08-23 LAB — GLUCOSE, CAPILLARY: Glucose-Capillary: 104 mg/dL — ABNORMAL HIGH (ref 70–99)

## 2024-08-23 LAB — RENAL FUNCTION PANEL
Albumin: 2.3 g/dL — ABNORMAL LOW (ref 3.5–5.0)
Anion gap: 9 (ref 5–15)
BUN: 25 mg/dL — ABNORMAL HIGH (ref 8–23)
CO2: 24 mmol/L (ref 22–32)
Calcium: 8.4 mg/dL — ABNORMAL LOW (ref 8.9–10.3)
Chloride: 103 mmol/L (ref 98–111)
Creatinine, Ser: 2.08 mg/dL — ABNORMAL HIGH (ref 0.61–1.24)
GFR, Estimated: 36 mL/min — ABNORMAL LOW (ref >60)
Glucose, Bld: 86 mg/dL (ref 70–99)
Phosphorus: 4 mg/dL (ref 2.5–4.6)
Potassium: 4 mmol/L (ref 3.5–5.1)
Sodium: 136 mmol/L (ref 135–145)

## 2024-08-23 LAB — CBC
HCT: 37.2 % — ABNORMAL LOW (ref 39.0–52.0)
Hemoglobin: 12.4 g/dL — ABNORMAL LOW (ref 13.0–17.0)
MCH: 31 pg (ref 26.0–34.0)
MCHC: 33.3 g/dL (ref 30.0–36.0)
MCV: 93 fL (ref 80.0–100.0)
Platelets: 198 K/uL (ref 150–400)
RBC: 4 MIL/uL — ABNORMAL LOW (ref 4.22–5.81)
RDW: 13.1 % (ref 11.5–15.5)
WBC: 5.9 K/uL (ref 4.0–10.5)
nRBC: 0 % (ref 0.0–0.2)

## 2024-08-23 MED ORDER — APIXABAN 5 MG PO TABS
5.0000 mg | ORAL_TABLET | Freq: Two times a day (BID) | ORAL | 0 refills | Status: DC
  Filled 2024-08-23 – 2024-09-22 (×2): qty 60, 30d supply, fill #0

## 2024-08-23 MED ORDER — METOPROLOL TARTRATE 25 MG PO TABS
25.0000 mg | ORAL_TABLET | Freq: Two times a day (BID) | ORAL | 0 refills | Status: DC
  Filled 2024-08-23: qty 180, 90d supply, fill #0

## 2024-08-23 MED ORDER — FUROSEMIDE 40 MG PO TABS
40.0000 mg | ORAL_TABLET | Freq: Two times a day (BID) | ORAL | 0 refills | Status: AC
  Filled 2024-08-23: qty 180, 90d supply, fill #0

## 2024-08-23 NOTE — Discharge Summary (Signed)
 Physician Discharge Summary  [name]  FMW:[id number] DOB: 1961/07/15 DOA: 08/17/2024  PCP: [name], MD  Admit date: 08/17/2024 Discharge date: 08/23/2024  Admitted From: home Disposition:  home  Recommendations for Outpatient Follow-up:  Follow up with PCP in 1-2 weeks Please obtain BMP/CBC in one week Follow-up with nephrology regarding kidney biopsy results Follow-up with cardiology in 3 to 4 weeks of uninterrupted anticoagulation for consideration for cardioversion  Home Health: None Equipment/Devices: none  Discharge Condition: stable CODE STATUS: Full code Diet Orders (From admission, onward)     Start     Ordered   08/22/24 1101  Diet heart healthy/carb modified Room service appropriate? Yes; Fluid consistency: Thin  Diet effective now       Question Answer Comment  Diet-HS Snack? Nothing   Room service appropriate? Yes   Fluid consistency: Thin      08/22/24 1100            HPI: [name]  is a 63 y.o. 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  He was referred to the ED from the cardiology clinic because of new onset A-fib with concern for CHF exacerbation.   Hospital Course / Discharge diagnoses: Principal problem Acute on chronic diastolic CHF, history of NICM, paroxysmal A-fib, flutter -cardiology consulted and evaluated patient while hospitalized.  He was diuresed IV Lasix , volume status has improved and he is currently euvolemic.  He was transition to oral Lasix , he is back to baseline and will be discharged home in stable condition.  He will be discharged on metoprolol , Jardiance  as well as furosemide  as below per cardiology instructions.  Hold Coreg , Entresto , will follow-up with outpatient cardiology.  Continue Eliquis ,  cardiology is considering cardioversion as an outpatient  Active problems CKD 3B -baseline creatinine around 2, he has had nephrotic range proteinuria, nephrology was consulted here and underwent kidney biopsy 12/16.  Biopsy results pending at the time of discharge.  His creatinine is at baseline, discussed with nephrology on the day of discharge OSA-continue CPAP Essential hypertension-continue medication as below Hyperlipidemia-continue statin COPD-stable, no wheezing DM2-continue home medications Obesity, morbid-BMI 40  Sepsis ruled out   Discharge Instructions   Allergies as of 08/23/2024       Reactions   Shellfish Allergy Nausea And Vomiting   Buspar  [buspirone ] Other (See Comments)   headache        Medication List     STOP taking these medications    aspirin  EC 81 MG tablet   carvedilol  12.5 MG tablet Commonly known as: COREG    Entresto  97-103 MG Generic drug: sacubitril -valsartan        TAKE these medications    acetaminophen  650 MG CR tablet Commonly known as: TYLENOL  Take 1,300 mg by mouth every 8 (eight) hours as needed for pain (chronic knee pain).   albuterol  108 (90 Base) MCG/ACT inhaler Commonly known as: VENTOLIN  HFA INHALE 2 PUFFS BY MOUTH EVERY 4 HOURS AS NEEDED FOR WHEEZE OR FOR SHORTNESS OF BREATH   ALPRAZolam  0.5 MG tablet Commonly known as: Xanax  Take 1 tablet (0.5 mg total) by mouth 2 (two) times daily as needed for anxiety.   apixaban  5 MG Tabs tablet Commonly known as: ELIQUIS  Take 1 tablet (5 mg total) by mouth 2 (two) times daily.   empagliflozin  25 MG Tabs tablet Commonly known as: Jardiance  Take 1  tablet (25 mg total) by mouth daily before breakfast.   fluticasone  furoate-vilanterol 200-25 MCG/ACT Aepb Commonly known as: Breo Ellipta  Inhale 1 puff into the lungs daily.   furosemide  40 MG tablet Commonly known as: LASIX  Take 1 tablet (40 mg total) by mouth 2 (two) times daily. What changed: when to take this    lidocaine  4 % Place 1 patch onto the skin daily.   metoprolol  tartrate 25 MG tablet Commonly known as: LOPRESSOR  Take 1 tablet (25 mg total) by mouth 2 (two) times daily.   multivitamin tablet Take 1 tablet by mouth daily.   omeprazole  40 MG capsule Commonly known as: PRILOSEC TAKE 1 CAPSULE (40 MG TOTAL) BY MOUTH DAILY. OFFICE VISIT FOR FURTHER REFILLS   rosuvastatin  10 MG tablet Commonly known as: Crestor  Take 1 tablet (10 mg total) by mouth daily. What changed: when to take this   Systane Hydration PF 0.4-0.3 % Soln Generic drug: Polyethyl Glyc-Propyl Glyc PF Place 1 drop into both eyes daily as needed (dry eyes, eye irritations).   traZODone  100 MG tablet Commonly known as: DESYREL  Take 1-2 tablets (100-200 mg total) by mouth at bedtime as needed. What changed:  how much to take when to take this       Consultations: Nephrology  Cardiology  Procedures/Studies:  CT BIOPSY Result Date: 09-17-2024 INDICATION: Proteinuria. EXAM: CT-guided biopsy TECHNIQUE: Multidetector CT imaging of the abdomen was performed following the standard protocol without IV contrast. RADIATION DOSE REDUCTION: This exam was performed according to the departmental dose-optimization program which includes automated exposure control, adjustment of the mA and/or kV according to patient size and/or use of iterative reconstruction technique. MEDICATIONS: None. ANESTHESIA/SEDATION: Moderate (conscious) sedation was employed during this procedure. A total of Versed  3 mg and Fentanyl  100 mcg was administered intravenously by the radiology nurse. Total intra-service moderate Sedation Time: 20 minutes. The patient's level of consciousness and vital signs were monitored continuously by radiology nursing throughout the procedure under my direct supervision. COMPLICATIONS: None immediate. PROCEDURE: Informed written consent was obtained from the patient after a thorough discussion of the procedural risks,  benefits and alternatives. All questions were addressed. Maximal Sterile Barrier Technique was utilized including caps, mask, sterile gowns, sterile gloves, sterile drape, hand hygiene and skin antiseptic. A timeout was performed prior to the initiation of the procedure. In a prone position with radiopaque markers in the left flank region, the patient's abdomen was scanned. Images were reviewed. Measurements were obtained. The patient's skin was then marked, prepped, and draped in the usual sterile fashion. Local anesthesia was achieved with 1% lidocaine . A small incision was made in the left flank region. Introducer needle was then advanced through the incision with intermittent axial images obtained to direct needle position. The needle was repositioned as needed to engage the lower pole of the left kidney. When the introducer needle was within the lower pole left kidney the stylet was removed and a BioPince needle was used to obtain a sample of the left kidney measuring approximately 2.3 cm in length. A subsequent sample was obtained. 2 mL of Gel-Foam slurry was then injected into the introducer needle. Final image demonstrates no active hemorrhage. Sterile dressing applied. IMPRESSION: Satisfactory CT-guided random renal biopsy in a patient with proteinuria. Electronically Signed   By: Cordella Banner   On: Sep 17, 2024 16:25   ECHOCARDIOGRAM COMPLETE Result Date: 08/18/2024    ECHOCARDIOGRAM REPORT   Patient Name:   MAXDEN NAJI Moede  Date of Exam: 08/18/2024 Medical Rec #:  [id number]  Height:       70.0 in Accession #:    [id number]        Weight:       300.0 lb Date of Birth:  [birth year]         BSA:          2.480 m Patient Age:    63 years          BP:           108/64 mmHg Patient Gender: M                 HR:           130 bpm. Exam Location:  Inpatient Procedure: 2D Echo, Cardiac Doppler and Color Doppler (Both Spectral and Color            Flow Doppler were utilized during procedure).  Indications:    CHF I50.31, Afib I48.91  History:        Patient has prior history of Echocardiogram examinations, most                 recent 03/06/2024. CHF, COPD; Risk Factors:Dyslipidemia and                 Hypertension.  Sonographer:    Tinnie Gosling RDCS Referring Phys: [phone] SUBRINA SUNDIL IMPRESSIONS  1. Left ventricular ejection fraction, by estimation, is 55%. The left ventricle has normal function. The left ventricle has no regional wall motion abnormalities. There is mild concentric left ventricular hypertrophy. Left ventricular diastolic parameters are indeterminate.  2. Right ventricular systolic function is normal. The right ventricular size is normal. Tricuspid regurgitation signal is inadequate for assessing PA pressure.  3. Left atrial size was mildly dilated.  4. Right atrial size was mildly dilated.  5. The mitral valve is normal in structure. No evidence of mitral valve regurgitation. No evidence of mitral stenosis.  6. The aortic valve is tricuspid. Aortic valve regurgitation is not visualized. No aortic stenosis is present.  7. The inferior vena cava is normal in size with greater than 50% respiratory variability, suggesting right atrial pressure of 3 mmHg.  8. The patient was in atrial fibrillation. FINDINGS  Left Ventricle: Left ventricular ejection fraction, by estimation, is 55%. The left ventricle has normal function. The left ventricle has no regional wall motion abnormalities. The left ventricular internal cavity size was normal in size. There is mild concentric left ventricular hypertrophy. Left ventricular diastolic parameters are indeterminate. Right Ventricle: The right ventricular size is normal. No increase in right ventricular wall thickness. Right ventricular systolic function is normal. Tricuspid regurgitation signal is inadequate for assessing PA pressure. Left Atrium: Left atrial size was mildly dilated. Right Atrium: Right atrial size was mildly dilated. Pericardium: There  is no evidence of pericardial effusion. Mitral Valve: The mitral valve is normal in structure. No evidence of mitral valve regurgitation. No evidence of mitral valve stenosis. Tricuspid Valve: The tricuspid valve is normal in structure. Tricuspid valve regurgitation is not demonstrated. Aortic Valve: The aortic valve is tricuspid. Aortic valve regurgitation is not visualized. No aortic stenosis is present. Pulmonic Valve: The pulmonic valve was normal in structure. Pulmonic valve regurgitation is not visualized. Aorta: The aortic root is normal in size and structure. Venous: The inferior vena cava is normal in size with greater than 50% respiratory variability, suggesting right atrial pressure of 3 mmHg. IAS/Shunts: No atrial level shunt detected by color flow Doppler.  LEFT VENTRICLE PLAX 2D LVIDd:  5.40 cm LVIDs:         4.20 cm LV PW:         1.10 cm LV IVS:        1.10 cm LVOT diam:     2.50 cm LV SV:         62 LV SV Index:   25 LVOT Area:     4.91 cm  LV Volumes (MOD) LV vol d, MOD A2C: 90.2 ml LV vol d, MOD A4C: 104.0 ml LV vol s, MOD A2C: 51.3 ml LV vol s, MOD A4C: 48.2 ml LV SV MOD A2C:     38.9 ml LV SV MOD A4C:     104.0 ml LV SV MOD BP:      48.9 ml RIGHT VENTRICLE             IVC RV S prime:     13.40 cm/s  IVC diam: 1.40 cm TAPSE (M-mode): 1.8 cm LEFT ATRIUM             Index        RIGHT ATRIUM           Index LA diam:        4.10 cm 1.65 cm/m   RA Area:     18.30 cm LA Vol (A2C):   74.3 ml 29.96 ml/m  RA Volume:   46.30 ml  18.67 ml/m LA Vol (A4C):   56.2 ml 22.66 ml/m LA Biplane Vol: 65.0 ml 26.21 ml/m  AORTIC VALVE LVOT Vmax:   97.40 cm/s LVOT Vmean:  64.000 cm/s LVOT VTI:    0.127 m  AORTA Ao Root diam: 3.60 cm Ao Asc diam:  3.30 cm  SHUNTS Systemic VTI:  0.13 m Systemic Diam: 2.50 cm Dalton McleanMD Electronically signed by Ezra Kanner Signature Date/Time: 08/18/2024/2:39:57 PM    Final    CT Angio Chest PE W and/or Wo Contrast Result Date: 08/18/2024 EXAM: CTA of the Chest  with contrast for PE 08/18/2024 12:44:41 AM TECHNIQUE: CTA of the chest was performed after the administration of intravenous contrast. Multiplanar reformatted images are provided for review. MIP images are provided for review. Automated exposure control, iterative reconstruction, and/or weight based adjustment of the mA/kV was utilized to reduce the radiation dose to as low as reasonably achievable. COMPARISON: PA lat chest yesterday, PA lat chest 02/18/2024, CTA chest 11/13/2014. CLINICAL HISTORY: c/f PE c/f PE FINDINGS: PULMONARY ARTERIES: Pulmonary arteries are normal in caliber. No embolism is seen to the segmental level. The subsegmental arterial bed is mostly obscured due to respiratory motion artifact. There are no findings of acute right heart strain. MEDIASTINUM: There is mild cardiomegaly. No pericardial effusion. The pulmonary veins, aorta, and great vessels are normal caliber. There are minimal single vessel calcifications in the proximal lad coronary artery . . . There is aortic tortuosity and mild atherosclerosis without aneurysm or dissection. Mild mediastinal lipomatosis similar to the prior study. LYMPH NODES: No mediastinal, hilar or axillary lymphadenopathy. LUNGS AND PLEURA: There is a stable 5 mm right middle lobe nodule on series 5 axial 81, consistent with a benign nodule given the length of stability. . No other pulmonary nodules are seen. There is no consolidation, effusion or pneumothorax. UPPER ABDOMEN: There is a small hiatal hernia. The esophagus is mildly patulous with normal wall thickness. SOFT TISSUES AND BONES: There is multilevel bridging enthesopathy of the thoracic spine. Spondylosis. No acute or other significant osseous findings. There is subareolar gynecomastia in both breasts. SABRA  Nonspecific symmetric skin thickening laterally in the mid to lower chest, query dependent congestive changes, cellulitis or third spacing. IMPRESSION: 1. No evidence of pulmonary embolism to the  segmental level. Subsegmental evaluation is limited by respiratory motion artifact. 2. No acute right heart strain. Mild cardiomegaly. Minimal single vessel cad. 3. Nonspecific symmetric skin thickening laterally in the mid to lower chest, possibly due to dependent congestive changes, cellulitis, or third spacing. Electronically signed by: Francis Quam MD 08/18/2024 01:13 AM EST RP Workstation: [id number]   DG Chest 2 View Result Date: 08/17/2024 CLINICAL DATA:  Generalized swelling EXAM: CHEST - 2 VIEW COMPARISON:  02/18/2024 FINDINGS: The heart size and mediastinal contours are within normal limits. Both lungs are clear. Degenerative changes of the spine. IMPRESSION: No active cardiopulmonary disease. Electronically Signed   By: Luke Bun M.D.   On: 08/17/2024 17:04     Subjective: - no chest pain, shortness of breath, no abdominal pain, nausea or vomiting.   Discharge Exam: BP 120/74 (BP Location: Left Arm)   Pulse 99   Temp 97.9 F (36.6 C) (Oral)   Resp 18   Ht 5' 10 (1.778 m)   Wt 129.1 kg   SpO2 97%   BMI 40.84 kg/m   General: Pt is alert, awake, not in acute distress Cardiovascular: RRR, S1/S2 +, no rubs, no gallops Respiratory: CTA bilaterally, no wheezing, no rhonchi Abdominal: Soft, NT, ND, bowel sounds + Extremities: no edema, no cyanosis    The results of significant diagnostics from this hospitalization (including imaging, microbiology, ancillary and laboratory) are listed below for reference.     Microbiology: Recent Results (from the past 240 hours)  MRSA Next Gen by PCR, Nasal     Status: None   Collection Time: 08/18/24  4:49 PM   Specimen: Nasal Mucosa; Nasal Swab  Result Value Ref Range Status   MRSA by PCR Next Gen NOT DETECTED NOT DETECTED Final    Comment: (NOTE) The GeneXpert MRSA Assay (FDA approved for NASAL specimens only), is one component of a comprehensive MRSA colonization surveillance program. It is not intended to diagnose MRSA  infection nor to guide or monitor treatment for MRSA infections. Test performance is not FDA approved in patients less than 73 years old. Performed at [facility], [address], [place], [place] [address]      Labs: Basic Metabolic Panel: Recent Labs  Lab 08/18/24 2057 08/19/24 0407 08/20/24 0331 08/21/24 1022 08/22/24 0947  NA 131* 130* 133* 134* 135  K 4.4 4.2 4.0 4.1 4.2  CL 101 103 102 105 102  CO2 26 22 19* 23 23  GLUCOSE 93 91 82 86 87  BUN 29* 28* 28* 28* 26*  CREATININE 1.88* 1.94* 1.95* 2.13* 2.05*  CALCIUM  8.2* 8.0* 7.8* 7.6* 8.7*  PHOS  --   --   --   --  3.8   Liver Function Tests: Recent Labs  Lab 08/17/24 1603 08/18/24 0445 08/21/24 1022 08/22/24 0947  AST 25 20 32  --   ALT 20 16 20   --   ALKPHOS 82 68 58  --   BILITOT 0.5 0.5 0.7  --   PROT 5.1* 4.6* 4.9*  --   ALBUMIN  <1.5* 1.5* 1.6* 2.4*   CBC: Recent Labs  Lab 08/17/24 1603 08/18/24 0307 08/19/24 0407 08/20/24 0331 08/22/24 0830  WBC 7.5 7.4 7.4 6.9 5.5  NEUTROABS 5.3  --   --   --   --   HGB 13.8 12.4* 12.3*  11.5* 13.0  HCT 40.2 35.9* 35.6* 34.0* 38.7*  MCV 93.3 93.2 92.5 94.2 94.2  PLT 270 252 220 201 200   CBG: Recent Labs  Lab 08/22/24 0634 08/22/24 1212 08/22/24 1651 08/22/24 2058 08/23/24 0627  GLUCAP 96 81 111* 101* 104*   Hgb A1c No results for input(s): HGBA1C in the last 72 hours. Lipid Profile Recent Labs    08/22/24 0947  CHOL 291*  HDL 125  LDLCALC 146*  TRIG 101  CHOLHDL 2.3   Thyroid  function studies No results for input(s): TSH, T4TOTAL, T3FREE, THYROIDAB in the last 72 hours.  Invalid input(s): FREET3 Urinalysis    Component Value Date/Time   COLORURINE YELLOW 11/20/2022 0855   APPEARANCEUR CLEAR 11/20/2022 0855   LABSPEC 1.010 11/20/2022 0855   PHURINE 5.5 11/20/2022 0855   GLUCOSEU NEGATIVE 11/20/2022 0855   HGBUR NEGATIVE 11/20/2022 0855   BILIRUBINUR NEGATIVE 11/20/2022 0855   BILIRUBINUR neg 09/29/2021 1603    KETONESUR NEGATIVE 11/20/2022 0855   PROTEINUR Positive (A) 09/29/2021 1603   PROTEINUR NEGATIVE 06/12/2011 1034   UROBILINOGEN 0.2 11/20/2022 0855   NITRITE NEGATIVE 11/20/2022 0855   LEUKOCYTESUR NEGATIVE 11/20/2022 0855    FURTHER DISCHARGE INSTRUCTIONS:   Get Medicines reviewed and adjusted: Please take all your medications with you for your next visit with your Primary MD   Laboratory/radiological data: Please request your Primary MD to go over all hospital tests and procedure/radiological results at the follow up, please ask your Primary MD to get all Hospital records sent to his/her office.   In some cases, they will be blood work, cultures and biopsy results pending at the time of your discharge. Please request that your primary care M.D. goes through all the records of your hospital data and follows up on these results.   Also Note the following: If you experience worsening of your admission symptoms, develop shortness of breath, life threatening emergency, suicidal or homicidal thoughts you must seek medical attention immediately by calling 911 or calling your MD immediately  if symptoms less severe.   You must read complete instructions/literature along with all the possible adverse reactions/side effects for all the Medicines you take and that have been prescribed to you. Take any new Medicines after you have completely understood and accpet all the possible adverse reactions/side effects.    Do not drive when taking Pain medications or sleeping medications (Benzodaizepines)   Do not take more than prescribed Pain, Sleep and Anxiety Medications. It is not advisable to combine anxiety,sleep and pain medications without talking with your primary care practitioner   Special Instructions: If you have smoked or chewed Tobacco  in the last 2 yrs please stop smoking, stop any regular Alcohol   and or any Recreational drug use.   Wear Seat belts while driving.   Please note: You  were cared for by a hospitalist during your hospital stay. Once you are discharged, your primary care physician will handle any further medical issues. Please note that NO REFILLS for any discharge medications will be authorized once you are discharged, as it is imperative that you return to your primary care physician (or establish a relationship with a primary care physician if you do not have one) for your post hospital discharge needs so that they can reassess your need for medications and monitor your lab values.  Time coordinating discharge: 35 minutes  SIGNED:  Nilda Fendt, MD, PhD 08/23/2024, 8:59 AM

## 2024-08-23 NOTE — Progress Notes (Signed)
 Went over AVS with patient, answered any/all questions, Ivs removed, patient belongings gathered, TOC meds given and patient was wheeled out to wife's vehicle.

## 2024-08-23 NOTE — Progress Notes (Signed)
 Heart Failure Navigator Progress Note  Assessed for Heart & Vascular TOC clinic readiness.  Patient does not meet criteria due to EF 55%, has a scheduled [facility] appointment on 09/12/2024. No HF TOC. .   Navigator will sign off at this time.   Stephane Haddock, BSN, Scientist, Clinical (histocompatibility And Immunogenetics) Only

## 2024-08-23 NOTE — TOC Transition Note (Signed)
 Transition of Care [facility]) - Discharge Note   Patient Details  Name: [name]  MRN: [id number] Date of Birth: 1960-10-31  Transition of Care [facility] CM/SW Contact:  Roxie KANDICE Stain, RN Phone Number: 08/23/2024, 9:51 AM   Clinical Narrative:    [name]  is stable to discharge home. Follow up apt on AVS. No ICM (Inpatient Care Management) needs at this time.    Final next level of care: Home/Self Care Barriers to Discharge: Barriers Resolved   Patient Goals and CMS Choice Patient states their goals for this hospitalization and ongoing recovery are:: return home          Discharge Placement                 Home      Discharge Plan and Services Additional resources added to the After Visit Summary for                                       Social Drivers of Health (SDOH) Interventions SDOH Screenings   Food Insecurity: No Food Insecurity (08/18/2024)  Housing: Low Risk (08/18/2024)  Transportation Needs: No Transportation Needs (08/18/2024)  Utilities: Not At Risk (08/18/2024)  Alcohol  Screen: Low Risk (04/18/2024)  Depression (PHQ2-9): Low Risk (04/18/2024)  Financial Resource Strain: Medium Risk (04/18/2024)  Physical Activity: Inactive (04/18/2024)  Social Connections: Socially Integrated (04/18/2024)  Stress: Stress Concern Present (04/18/2024)  Tobacco Use: Medium Risk (08/17/2024)  Health Literacy: Adequate Health Literacy (12/29/2023)     Readmission Risk Interventions     No data to display

## 2024-08-23 NOTE — Plan of Care (Signed)

## 2024-08-23 NOTE — Progress Notes (Signed)
 Rounding Note   Patient Name: [name]  Date of Encounter: 08/23/2024  [facility] Cardiologist: Redell Shallow, MD   Subjective Denies CP or dyspnea  Scheduled Meds:  apixaban   5 mg Oral BID   empagliflozin   25 mg Oral QAC breakfast   fluticasone  furoate-vilanterol  1 puff Inhalation Daily   furosemide   40 mg Oral BID   insulin  aspart  0-5 Units Subcutaneous QHS   metoprolol  tartrate  25 mg Oral BID   pantoprazole   40 mg Oral Daily   rosuvastatin   10 mg Oral Daily   sodium chloride  flush  3 mL Intravenous Q12H   sodium chloride  flush  3 mL Intravenous Q12H   Continuous Infusions:   PRN Meds: acetaminophen  **OR** acetaminophen , ALPRAZolam , insulin  aspart, ipratropium-albuterol , loperamide , ondansetron  **OR** ondansetron  (ZOFRAN ) IV, sodium chloride  flush, traZODone    Vital Signs  Vitals:   08/22/24 1825 08/22/24 1936 08/22/24 2300 08/23/24 0630  BP: 111/76 115/82 (!) 135/91   Pulse: 88 88 88   Resp: 18 17 20    Temp: 98 F (36.7 C) 97.8 F (36.6 C) 98 F (36.7 C)   TempSrc: Oral Oral Oral   SpO2:  99% 99%   Weight:    129.1 kg  Height:        Intake/Output Summary (Last 24 hours) at 08/23/2024 0710 Last data filed at 08/23/2024 0600 Gross per 24 hour  Intake 480 ml  Output 1350 ml  Net -870 ml      08/23/2024    6:30 AM 08/22/2024    5:00 AM 08/21/2024    4:30 AM  Last 3 Weights  Weight (lbs) 284 lb 9.8 oz 286 lb 9.6 oz 287 lb 0.6 oz  Weight (kg) 129.1 kg 130 kg 130.2 kg      Telemetry Atrial flutter with variable conduction; rate controlled- Personally Reviewed   Physical Exam  GEN: NAD, WD, WN Neck: supple Cardiac: irregular Respiratory: CTA GI: Soft, NT/ND, no masses; kidney biopsy site without hematoma. MS: no edema Neuro:  grossly intact Psych: Normal affect   Labs High Sensitivity Troponin:   Recent Labs  Lab 08/17/24 1603 08/17/24 1940  TROPONINIHS 3 <2      Chemistry Recent Labs  Lab 08/17/24 1603  08/18/24 0445 08/18/24 0920 08/20/24 0331 08/21/24 1022 08/22/24 0947  NA 126* 131*   < > 133* 134* 135  K 4.8 4.3   < > 4.0 4.1 4.2  CL 95* 103   < > 102 105 102  CO2 22 22   < > 19* 23 23  GLUCOSE 83 81   < > 82 86 87  BUN 31* 30*   < > 28* 28* 26*  CREATININE 2.20* 1.81*   < > 1.95* 2.13* 2.05*  CALCIUM  7.8* 7.2*   < > 7.8* 7.6* 8.7*  PROT 5.1* 4.6*  --   --  4.9*  --   ALBUMIN  <1.5* 1.5*  --   --  1.6* 2.4*  AST 25 20  --   --  32  --   ALT 20 16  --   --  20  --   ALKPHOS 82 68  --   --  58  --   BILITOT 0.5 0.5  --   --  0.7  --   GFRNONAA 33* 42*   < > 38* 35* 36*  ANIONGAP 9 6   < > 12 6 10    < > = values in this interval not displayed.  Hematology Recent Labs  Lab 08/19/24 0407 08/20/24 0331 08/22/24 0830  WBC 7.4 6.9 5.5  RBC 3.85* 3.61* 4.11*  HGB 12.3* 11.5* 13.0  HCT 35.6* 34.0* 38.7*  MCV 92.5 94.2 94.2  MCH 31.9 31.9 31.6  MCHC 34.6 33.8 33.6  RDW 13.3 13.4 13.2  PLT 220 201 200   Thyroid   Recent Labs  Lab 08/17/24 2344  TSH 1.766    BNP Recent Labs  Lab 08/17/24 1601 08/18/24 0307  BNP 113.3* 63.1      Patient Profile   [name]  is a 63 y.o. 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  Echocardiogram this admission shows normal LV function, mild left ventricular hypertrophy, mild biatrial enlargement.  CTA showed no pulmonary embolus.  Assessment & Plan  1 atrial flutter-patient remains in atrial flutter.  Heart rate is controlled.  Continue metoprolol .  Apixaban  has been resumed.  Given that his rate is controlled and he is asymptomatic I think he can be discharged from a cardiac standpoint.  We will plan to proceed with cardioversion in 3 to 4 weeks after uninterrupted anticoagulation.     2 acute on chronic diastolic congestive heart failure-volume  status is reasonable.  Continue Lasix  40 mg twice daily.  3 history of nonischemic cardiomyopathy-continue Jardiance , beta-blocker and Lasix .  LV function is now normal.  4 chronic stage III kidney disease-now status post renal biopsy.  Continue to follow while on diuretics.  5 hypertension-patient's blood pressure is controlled.  Continue present medications.  Patient can be discharged from a cardiac standpoint on present medications.  Will check potassium and renal function in 1 week.  Follow-up APP 2 weeks and if patient remains in atrial fibrillation/flutter at that time we will arrange cardioversion 4 weeks from now.  Follow-up with me in 3 months.  For questions or updates, please contact [facility] Please consult [email or web address] for contact info under       Signed, Redell Shallow, MD  08/23/2024, 7:10 AM

## 2024-08-23 NOTE — Progress Notes (Signed)
 [facility] Progress Note    Assessment/ Plan:   CKD3b, nonoliguric Nephrotic Syndrome -followed by Dr. Norine. Serological work up as an OP unrevealing thus far (including PLA2R). Baseline Cr around 1.8-2.2. renal biopsy which is to be performed before cardioversion given the need for anticoagulation 4 weeks thereafter. Completed renal biopsy 12/16--will follow up results as OP -Cr stable today -can transition to lasix  40mg  PO BID on discharge -advised wife to call the office today to set up appt -Avoid nephrotoxic medications including NSAIDs and iodinated intravenous contrast exposure unless the latter is absolutely indicated.  Preferred narcotic agents for pain control are hydromorphone, fentanyl , and methadone. Morphine should not be used. Avoid Baclofen and avoid oral sodium phosphate and magnesium citrate based laxatives / bowel preps. Continue strict Input and Output monitoring. Will monitor the patient closely with you and intervene or adjust therapy as indicated by changes in clinical status/labs   A Flutter -cardiology following, on heparin  gtt and PO metoprolol   Acute on chronic diastolic CHF -can transition to lasix  40mg  BID. Continue with jardiance  -daily weights, strict I/O  HTN -BP acceptable  Anemia -Hgb acceptable/at goal, monitor for now  Hyponatremia -mild, secondary to hypervolemia, diuresis as above. Resolved--Na up to 136  Discussed with wife at the bedside. Discussed with primary service.  Okay with discharge from my end. Rest of plan as above.  Subjective:   Patient seen and examined bedside. No acute events. He reports that the swelling in his legs are improving but arms are still swollen. Currently NPO for renal biopsyu today Weight 130.2->130kg Uop ~1.5L   Objective:   BP 120/74   Pulse 99   Temp 97.9 F (36.6 C) (Oral)   Resp 18   Ht 5' 10 (1.778 m)   Wt 129.1 kg   SpO2 97%   BMI 40.84 kg/m   Intake/Output Summary (Last  24 hours) at 08/23/2024 1009 Last data filed at 08/23/2024 0600 Gross per 24 hour  Intake 480 ml  Output 1350 ml  Net -870 ml   Weight change: -0.9 kg  Physical Exam: Gen: NAD, sitting up in bed, getting ready for discharge CVS: irreg irreg Resp: normal wob, unlabored, cta bl Abd: obese, soft Ext: trace edema b/l Les, b/l UE edematous  Neuro: awake, alert  Imaging: CT BIOPSY Result Date: 08/22/2024 INDICATION: Proteinuria. EXAM: CT-guided biopsy TECHNIQUE: Multidetector CT imaging of the abdomen was performed following the standard protocol without IV contrast. RADIATION DOSE REDUCTION: This exam was performed according to the departmental dose-optimization program which includes automated exposure control, adjustment of the mA and/or kV according to patient size and/or use of iterative reconstruction technique. MEDICATIONS: None. ANESTHESIA/SEDATION: Moderate (conscious) sedation was employed during this procedure. A total of Versed  3 mg and Fentanyl  100 mcg was administered intravenously by the radiology nurse. Total intra-service moderate Sedation Time: 20 minutes. The patient's level of consciousness and vital signs were monitored continuously by radiology nursing throughout the procedure under my direct supervision. COMPLICATIONS: None immediate. PROCEDURE: Informed written consent was obtained from the patient after a thorough discussion of the procedural risks, benefits and alternatives. All questions were addressed. Maximal Sterile Barrier Technique was utilized including caps, mask, sterile gowns, sterile gloves, sterile drape, hand hygiene and skin antiseptic. A timeout was performed prior to the initiation of the procedure. In a prone position with radiopaque markers in the left flank region, the patient's abdomen was scanned. Images were reviewed. Measurements were obtained. The patient's skin was then marked, prepped, and  draped in the usual sterile fashion. Local anesthesia was  achieved with 1% lidocaine . A small incision was made in the left flank region. Introducer needle was then advanced through the incision with intermittent axial images obtained to direct needle position. The needle was repositioned as needed to engage the lower pole of the left kidney. When the introducer needle was within the lower pole left kidney the stylet was removed and a BioPince needle was used to obtain a sample of the left kidney measuring approximately 2.3 cm in length. A subsequent sample was obtained. 2 mL of Gel-Foam slurry was then injected into the introducer needle. Final image demonstrates no active hemorrhage. Sterile dressing applied. IMPRESSION: Satisfactory CT-guided random renal biopsy in a patient with proteinuria. Electronically Signed   By: Cordella Banner   On: 08/22/2024 16:25    Labs: BMET Recent Labs  Lab 08/18/24 1752 08/18/24 2057 08/19/24 0407 08/20/24 0331 08/21/24 1022 08/22/24 0947 08/23/24 0837  NA 130* 131* 130* 133* 134* 135 136  K 5.1 4.4 4.2 4.0 4.1 4.2 4.0  CL 101 101 103 102 105 102 103  CO2 22 26 22  19* 23 23 24   GLUCOSE 99 93 91 82 86 87 86  BUN 30* 29* 28* 28* 28* 26* 25*  CREATININE 1.99* 1.88* 1.94* 1.95* 2.13* 2.05* 2.08*  CALCIUM  7.7* 8.2* 8.0* 7.8* 7.6* 8.7* 8.4*  PHOS  --   --   --   --   --  3.8 4.0   CBC Recent Labs  Lab 08/17/24 1603 08/18/24 0307 08/19/24 0407 08/20/24 0331 08/22/24 0830 08/23/24 0837  WBC 7.5   < > 7.4 6.9 5.5 5.9  NEUTROABS 5.3  --   --   --   --   --   HGB 13.8   < > 12.3* 11.5* 13.0 12.4*  HCT 40.2   < > 35.6* 34.0* 38.7* 37.2*  MCV 93.3   < > 92.5 94.2 94.2 93.0  PLT 270   < > 220 201 200 198   < > = values in this interval not displayed.    Medications:     apixaban   5 mg Oral BID   empagliflozin   25 mg Oral QAC breakfast   fluticasone  furoate-vilanterol  1 puff Inhalation Daily   furosemide   40 mg Oral BID   insulin  aspart  0-5 Units Subcutaneous QHS   metoprolol  tartrate  25 mg Oral BID    pantoprazole   40 mg Oral Daily   rosuvastatin   10 mg Oral Daily   sodium chloride  flush  3 mL Intravenous Q12H   sodium chloride  flush  3 mL Intravenous Q12H      Ephriam Stank, MD [facility] 08/23/2024, 10:09 AM

## 2024-08-24 ENCOUNTER — Encounter (HOSPITAL_COMMUNITY): Payer: Self-pay

## 2024-08-24 ENCOUNTER — Telehealth: Payer: Self-pay | Admitting: *Deleted

## 2024-08-24 LAB — SURGICAL PATHOLOGY

## 2024-08-25 LAB — LAB REPORT - SCANNED: Creatinine, POC: 362.5 mg/dL

## 2024-08-29 ENCOUNTER — Encounter (HOSPITAL_COMMUNITY): Payer: Self-pay

## 2024-08-29 ENCOUNTER — Ambulatory Visit (HOSPITAL_COMMUNITY): Attending: Internal Medicine

## 2024-08-29 ENCOUNTER — Other Ambulatory Visit: Payer: Self-pay | Admitting: Pharmacist

## 2024-08-29 DIAGNOSIS — Z01818 Encounter for other preprocedural examination: Secondary | ICD-10-CM

## 2024-08-29 NOTE — Progress Notes (Signed)
" ° °  08/29/2024 Name: [name]  MRN: [id number] DOB: Dec 21, 1960  No chief complaint on file.   [name]  is a 63 y.o. year old male who presented for a telephone visit.   They were referred to the pharmacist by a quality report for assistance in managing medication access.    Subjective:  Care Team: Primary Care Provider: [name], MD ; Next Scheduled Visit: 10/23/2024 Podiatrist: Dr [name]: Next Scheduled Visit: 09/18/2024 Cardiology - next scheduled appointment: 09/12/2024  Patient was hospitalized 08/17/2024 to 08/23/2024 for CHF exacerbation and afib.   Medications stopped at discharge:  Aspirin  81mg  daily Carvedilol  12.5mg   Entresto  97/103mg   Medications started at discharge:  Eliquis  5mg  twice a day (should be able to get with [name])  Metoprolol  25mg  twice a day Furosemide  40mg  twice a day     Medication Access/Adherence  Current Pharmacy:  [facility] - [place], [place] - [address] [place] [address] Phone: [phone] Fax: [phone]  [id number] - [place], [place] - [address] [place] [address] Phone: [phone] Fax: [phone]  [id number] - [address], [place] - [address] [place] [address] Phone: [phone] Fax: [phone]  [name] [address] [place] [address] Phone: [phone] Fax: [phone]   Patient reports affordability concerns with their medications: Yes   - worried about cost of Eliquis  Patient reports access/transportation concerns to their pharmacy: No  Patient reports adherence concerns with their medications:  No      Objective:  Lab Results  Component Value Date   HGBA1C 6.2 04/18/2024    Lab Results  Component Value Date   CREATININE 2.08 (H) 08/23/2024   BUN 25 (H) 08/23/2024   NA 136 08/23/2024   K 4.0 08/23/2024   CL 103  08/23/2024   CO2 24 08/23/2024    Lab Results  Component Value Date   CHOL 291 (H) 08/22/2024   HDL 125 08/22/2024   LDLCALC 146 (H) 08/22/2024   LDLDIRECT 30.0 04/18/2024   TRIG 101 08/22/2024   CHOLHDL 2.3 08/22/2024    Medications Reviewed Today   Medications were not reviewed in this encounter       Card No.: [id number] BIN: [id number] PCN: [id number] PC Group: [id number]   Assessment/Plan:   Medication Management / Access - Patient approved for for [facility] - should cover all medication cost for cardiomyopathy / CHF including Eliquis  and Jardiance .    CHF:  - Reviewed importance of weighing daily. Patient has been weighing daily and knows what to do if weight increase by more than 3 lbs in 24 hours or 5 lbs in 1 week.  - Continue metoprolol , furosemide  and Jardiance .  - Follow up as planned with cardiology team in January 2026.  - Reassured patient and his wife that Eliquis  is now covered with his [name] for cardiomyopathy. They can use it with next fill. Call me if they have any issues.    Follow Up Plan: 1 to 2 months  [name], PharmD Clinical Pharmacist [facility] Primary Care  Population Health [phone]   "
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
## 2024-09-12 ENCOUNTER — Encounter: Payer: Self-pay | Admitting: Emergency Medicine

## 2024-09-12 ENCOUNTER — Ambulatory Visit: Attending: Emergency Medicine | Admitting: Emergency Medicine

## 2024-09-12 VITALS — BP 146/92 | HR 102 | Ht 70.0 in | Wt 261.6 lb

## 2024-09-12 DIAGNOSIS — I480 Paroxysmal atrial fibrillation: Secondary | ICD-10-CM | POA: Diagnosis not present

## 2024-09-12 DIAGNOSIS — I428 Other cardiomyopathies: Secondary | ICD-10-CM | POA: Insufficient documentation

## 2024-09-12 DIAGNOSIS — I509 Heart failure, unspecified: Secondary | ICD-10-CM | POA: Diagnosis not present

## 2024-09-12 DIAGNOSIS — I10 Essential (primary) hypertension: Secondary | ICD-10-CM | POA: Insufficient documentation

## 2024-09-12 DIAGNOSIS — I4892 Unspecified atrial flutter: Secondary | ICD-10-CM | POA: Insufficient documentation

## 2024-09-12 DIAGNOSIS — I420 Dilated cardiomyopathy: Secondary | ICD-10-CM | POA: Insufficient documentation

## 2024-09-12 MED ORDER — METOPROLOL TARTRATE 50 MG PO TABS: 50.0000 mg | ORAL_TABLET | Freq: Two times a day (BID) | ORAL | 3 refills | Status: AC

## 2024-09-12 NOTE — Assessment & Plan Note (Signed)
 Historical noncompliance with oral Lasix  outpatient L/T volume overload Admitted to hospital from 08/17/2024 - 08/23/2024 for CHF exacerbation and new onset A-fib Doing well in clinic today, denies chest pain, shortness of breath, orthopnea. States they removed 40 pounds of fluid off and while in the hospital, weight on date of admission was 300 lbs, today is 261 lbs. Patient reports he has been checking his weights at home, has not noticed significant fluctuations. Appears euvolemic on exam Coreg  switched to metoprolol , Entresto  and spironolactone  stopped secondary to declining kidney function. Increasing Lopressor  as above, continue Jardiance  25 mg daily, Lasix  40 mg twice daily. Given patient is asymptomatic today, could consider further GDMT titration following visit with nephrology to allow more time for kidney function to recover following significant diuresis on recent hospital admission.

## 2024-09-12 NOTE — Assessment & Plan Note (Addendum)
 BPs averaging typically in the 120s-140s/70s-90s range We will continue to hold off on Entresto  or spironolactone  to allow time for renal function to recover, patient follows up with nephrology in 2-3 weeks. Will increase Lopressor  to 50 mg twice daily as above Continue Lasix  40 mg twice daily

## 2024-09-12 NOTE — Assessment & Plan Note (Addendum)
 EKG atrial flutter with variable AV block, 102 bpm after walking at the clinic Heart rate reported typically well-controlled at home Cardiac unaware, denies palpitations, near-syncope, lightheadedness Missed a dose of his Eliquis  this morning following abdominal cramping that has since resolved Discussed case with DOD, Dr. Michele, who agrees that considering patient is asymptomatic, stable vital signs, morbid obesity, longstanding OSA, would be best to reschedule DCCV for 1 month out. Continue Eliquis  5 mg twice daily Increase Lopressor  to 50 mg twice daily to aid in rate and HTN control Proceed to ED with any new or worsening symptoms

## 2024-09-12 NOTE — Patient Instructions (Addendum)
 Medication Instructions:  INCREASE: Metoprolol  Tartrate Lopressor  to 50 mg (2)two times daily (Hold if blood pressure is less than 110/65 or heart rate is less than 55). If you hold call our office at [phone]  *If you need a refill on your cardiac medications before your next appointment, please call your pharmacy*  Lab Work: TODAY: CBC, BMET If you have labs (blood work) drawn today and your tests are completely normal, you will receive your results only by: MyChart Message (if you have MyChart) OR A paper copy in the mail If you have any lab test that is abnormal or we need to change your treatment, we will call you to review the results.  Testing/Procedures: NONE  Follow-Up: At [facility], you and your health needs are our priority.  As part of our continuing mission to provide you with exceptional heart care, our providers are all part of one team.  This team includes your primary Cardiologist (physician) and Advanced Practice Providers or APPs (Physician Assistants and Nurse Practitioners) who all work together to provide you with the care you need, when you need it.  Your next appointment:   3 week(s)  Provider:   Any APP  We recommend signing up for the patient portal called MyChart.  Sign up information is provided on this After Visit Summary.  MyChart is used to connect with patients for Virtual Visits (Telemedicine).  Patients are able to view lab/test results, encounter notes, upcoming appointments, etc.  Non-urgent messages can be sent to your provider as well.   To learn more about what you can do with MyChart, go to [email or web address].   Other Instructions

## 2024-09-12 NOTE — Assessment & Plan Note (Addendum)
 Patient has worked hard to change his diet, which now consists mostly of fruits and fruit juices Discussed how that may be causing some of his abdominal discomfort, and the need for a more balanced diet. Given history of BMI of 37.5, will refer to nutritional management services for further information on healthy diet and lifestyle.

## 2024-09-13 ENCOUNTER — Ambulatory Visit: Payer: Self-pay | Admitting: Emergency Medicine

## 2024-09-13 LAB — CBC
Hematocrit: 42.6 % (ref 37.5–51.0)
Hemoglobin: 14 g/dL (ref 13.0–17.7)
MCH: 31.2 pg (ref 26.6–33.0)
MCHC: 32.9 g/dL (ref 31.5–35.7)
MCV: 95 fL (ref 79–97)
Platelets: 270 x10E3/uL (ref 150–450)
RBC: 4.49 x10E6/uL (ref 4.14–5.80)
RDW: 12.7 % (ref 11.6–15.4)
WBC: 19.1 x10E3/uL — ABNORMAL HIGH (ref 3.4–10.8)

## 2024-09-13 LAB — BASIC METABOLIC PANEL WITH GFR
BUN/Creatinine Ratio: 17 (ref 10–24)
BUN: 36 mg/dL — AB (ref 8–27)
CO2: 25 mmol/L (ref 20–29)
Calcium: 9.7 mg/dL (ref 8.6–10.2)
Chloride: 102 mmol/L (ref 96–106)
Creatinine, Ser: 2.1 mg/dL — AB (ref 0.76–1.27)
Glucose: 107 mg/dL — AB (ref 70–99)
Potassium: 4.3 mmol/L (ref 3.5–5.2)
Sodium: 142 mmol/L (ref 134–144)
eGFR: 35 mL/min/1.73 — AB

## 2024-09-13 NOTE — Progress Notes (Signed)
 Spoke with patient regarding lab results. He has appts for kidney and pcp scheduled.

## 2024-09-18 ENCOUNTER — Ambulatory Visit: Admitting: Podiatry

## 2024-09-18 ENCOUNTER — Encounter: Payer: Self-pay | Admitting: Podiatry

## 2024-09-18 DIAGNOSIS — M79676 Pain in unspecified toe(s): Secondary | ICD-10-CM | POA: Diagnosis not present

## 2024-09-18 DIAGNOSIS — M2142 Flat foot [pes planus] (acquired), left foot: Secondary | ICD-10-CM

## 2024-09-18 DIAGNOSIS — Z0189 Encounter for other specified special examinations: Secondary | ICD-10-CM

## 2024-09-18 DIAGNOSIS — B351 Tinea unguium: Secondary | ICD-10-CM | POA: Diagnosis not present

## 2024-09-18 DIAGNOSIS — M2141 Flat foot [pes planus] (acquired), right foot: Secondary | ICD-10-CM

## 2024-09-18 DIAGNOSIS — E1142 Type 2 diabetes mellitus with diabetic polyneuropathy: Secondary | ICD-10-CM

## 2024-09-18 DIAGNOSIS — E119 Type 2 diabetes mellitus without complications: Secondary | ICD-10-CM | POA: Diagnosis not present

## 2024-09-20 ENCOUNTER — Other Ambulatory Visit: Payer: Self-pay | Admitting: Cardiology

## 2024-09-20 DIAGNOSIS — I4892 Unspecified atrial flutter: Secondary | ICD-10-CM

## 2024-09-20 NOTE — Telephone Encounter (Signed)
" °*  STAT* If patient is at the pharmacy, call can be transferred to refill team.   1. Which medications need to be refilled? (please list name of each medication and dose if known) apixaban  (ELIQUIS ) 5 MG TABS tablet    2. Would you like to learn more about the convenience, safety, & potential cost savings by using the [facility]?    3. Are you open to using the [facility] (Type [facility].  ).   4. Which pharmacy/location (including street and city if local pharmacy) is medication to be sent to?  [id number] - [place], [place] - [address]     5. Do they need a 30 day or 90 day supply? 90 day  "

## 2024-09-21 NOTE — Telephone Encounter (Signed)
 Patient and wife are following up. He says he will run out of medication over the weekend and wanted to make sure this request will be processed soon.

## 2024-09-22 ENCOUNTER — Other Ambulatory Visit (HOSPITAL_COMMUNITY): Payer: Self-pay

## 2024-09-22 ENCOUNTER — Telehealth: Payer: Self-pay | Admitting: Pharmacist

## 2024-09-22 NOTE — Telephone Encounter (Signed)
 Patient's wife states he has been started on Eliquis . He needs refill but cost was high. They are worried about missing a dose. They need information about his [name] sent to [facility] at [address].   Provided needed info and cost was $0

## 2024-09-25 NOTE — Progress Notes (Signed)
"  °  Subjective:  Patient ID: [name] , male    DOB: July 12, 1961,  MRN: [id number]  [name]  presents to clinic today for for annual diabetic foot examination, at risk foot care with history of diabetic neuropathy, and painful thick toenails that are difficult to trim. Pain interferes with ambulation. Aggravating factors include wearing enclosed shoe gear. Pain is relieved with periodic professional debridement. His wife is present during today's visit. Chief Complaint  Patient presents with   Diabetes    A1c is 6 something. Dr. Katrinka is his PCP and he was him last week   New problem(s): None.   PCP is [name], MD.  Allergies[1]  Review of Systems: Negative except as noted in the HPI.  Objective: No changes noted in today's physical examination. There were no vitals filed for this visit. [name]  is a pleasant 64 y.o. male obese in NAD. AAO x 3.   Diabetic foot exam was performed with the following findings:   Vascular Examination: Capillary refill time immediate b/l. Vascular status intact b/l with palpable pedal pulses. Pedal hair present b/l. No pain with calf compression b/l. Skin temperature gradient WNL b/l. No cyanosis or clubbing b/l. No ischemia or gangrene noted b/l.   Neurological Examination: Sensation grossly intact b/l with 10 gram monofilament. Vibratory sensation intact b/l.   Dermatological Examination: Pedal skin with normal turgor, texture and tone b/l.  No open wounds. No interdigital macerations.   Toenails 1-5 b/l thick, discolored, elongated with subungual debris and pain on dorsal palpation.   No hyperkeratotic nor porokeratotic lesions.  Musculoskeletal Examination: Muscle strength 5/5 to all lower extremity muscle groups bilaterally. No pain, crepitus or joint limitation noted with ROM bilateral LE. Pes planus b/l.  Radiographs: None    Assessment/Plan: 1. Pain due to onychomycosis of toenail   2. Pes planus of  both feet   3. Diabetic polyneuropathy associated with type 2 diabetes mellitus (HCC)   4. Encounter for diabetic foot exam (HCC)   Diabetic foot examination performed today. All patient's and/or POA's questions/concerns addressed on today's visit. Toenails 1-5 b/l debrided in length and girth without incident. Continue foot and shoe inspections daily. Monitor blood glucose per PCP/Endocrinologist's recommendations. Continue soft, supportive shoe gear daily. Report any pedal injuries to medical professional. Call office if there are any questions/concerns. -Patient/POA to call should there be question/concern in the interim.   Return in about 3 months (around 12/17/2024).  Delon LITTIE Merlin, DPM      [facility] LOCATION: [address], [place] [address]                   Office [phone]   [facility] LOCATION: [address] [place], [place] [address] Office [phone]      [1]  Allergies Allergen Reactions   Shellfish Allergy Nausea And Vomiting   Buspar  [Buspirone ] Other (See Comments)    headache   "

## 2024-09-26 MED ORDER — APIXABAN 5 MG PO TABS: 5.0000 mg | ORAL_TABLET | Freq: Two times a day (BID) | ORAL | 3 refills | Status: AC

## 2024-09-26 NOTE — Telephone Encounter (Signed)
 Prescription refill request for Eliquis  received. Indication: a fib Last office visit: 09/12/24 Scr: 2.1 epic 09/12/24 Age: 64 Weight: 118 kg

## 2024-09-27 ENCOUNTER — Other Ambulatory Visit: Payer: Self-pay

## 2024-09-27 ENCOUNTER — Telehealth: Payer: Self-pay | Admitting: Family Medicine

## 2024-09-27 LAB — LAB REPORT - SCANNED: Creatinine, POC: 102.7 mg/dL

## 2024-09-27 MED ORDER — ALPRAZOLAM 0.5 MG PO TABS: 0.5000 mg | ORAL_TABLET | Freq: Two times a day (BID) | ORAL | 0 refills | Status: DC | PRN

## 2024-09-27 NOTE — Telephone Encounter (Signed)
 Copied from CRM #[id number]. Topic: Clinical - Medication Refill >> Sep 27, 2024  1:54 PM Alexandria E wrote: Medication: ALPRAZolam  (XANAX ) 0.5 MG tablet  Has the patient contacted their pharmacy? No (Agent: If no, request that the patient contact the pharmacy for the refill. If patient does not wish to contact the pharmacy document the reason why and proceed with request.) (Agent: If yes, when and what did the pharmacy advise?)  This is the patient's preferred pharmacy:   [id number] [place], [place] - [address] [place] [address] Phone: [phone] Fax: [phone]    Is this the correct pharmacy for this prescription? Yes If no, delete pharmacy and type the correct one.   Has the prescription been filled recently? No  Is the patient out of the medication? Yes  Has the patient been seen for an appointment in the last year OR does the patient have an upcoming appointment? Yes  Can we respond through MyChart? Yes  Agent: Please be advised that Rx refills may take up to 3 business days. We ask that you follow-up with your pharmacy.

## 2024-09-28 ENCOUNTER — Other Ambulatory Visit: Payer: Self-pay | Admitting: Pharmacist

## 2024-09-28 NOTE — Progress Notes (Signed)
 "  09/28/2024 Name: [name]  MRN: [id number] DOB: [birth year]  Chief Complaint  Patient presents with   Medication Management   Medication Adherence    [name]  is a 64 y.o. year old male who presented for a telephone visit.   They were referred to the pharmacist by a quality report for assistance in managing medication access.    Subjective:  Care Team: Primary Care Provider: [name], MD ; Next Scheduled Visit: 10/23/2024 Podiatrist: [name]: Next Scheduled Visit: 12/18/2024 Cardiology - Rana, [name]- next scheduled appointment: 10/03/2024 Neuro - Michael Forbes, [name] - Next scheduled appointment: 10/30/2024  Patient was hospitalized 08/17/2024 to 08/23/2024 for CHF exacerbation and afib.   Medications stopped at discharge:  Aspirin  81mg  daily Carvedilol  12.5mg   Entresto  97/103mg   Medications started at discharge:  Eliquis  5mg  twice a day (should be able to get with [facility])  Metoprolol  25mg  twice a day Furosemide  40mg  twice a day  Patient was seen in afib clinic 09/12/2024 and dose of metoprolol  was increased to 50mg  twice a day. Hold if blood pressure is < 110/65 or HR < 55   Medication Access/Adherence  Current Pharmacy:  [facility] - [place], [place] - [address] [place] [address] Phone: [phone] Fax: [phone]  [id number] [place], [place] - [address] [place] [address] Phone: [phone] Fax: [phone]  [facility] [address], [place] - [address] [place] [address] Phone: [phone] Fax: [phone]  [name] [address] [place] [address] Phone: [phone] Fax: [phone]   Patient reports affordability concerns with their medications: No   - Patient reports access/transportation concerns to their pharmacy: No  Patient reports adherence concerns with their  medications:  No      Objective:  Lab Results  Component Value Date   HGBA1C 6.2 04/18/2024    Lab Results  Component Value Date   CREATININE 2.10 (H) 09/12/2024   BUN 36 (H) 09/12/2024   NA 142 09/12/2024   K 4.3 09/12/2024   CL 102 09/12/2024   CO2 25 09/12/2024    Lab Results  Component Value Date   CHOL 291 (H) 08/22/2024   HDL 125 08/22/2024   LDLCALC 146 (H) 08/22/2024   LDLDIRECT 30.0 04/18/2024   TRIG 101 08/22/2024   CHOLHDL 2.3 08/22/2024    Medications Reviewed Today     Reviewed by [name], RPH-CPP (Pharmacist) on 09/28/24 at 1526  Med List Status: <None>   Medication Order Taking? Sig Documenting Provider Last Dose Status Informant  acetaminophen  (TYLENOL ) 650 MG CR tablet [id number] Yes Take 1,300 mg by mouth every 8 (eight) hours as needed for pain (chronic knee pain). [provider]  Active Self, Pharmacy Records  albuterol  (VENTOLIN  HFA) 108 [phone] Base) MCG/ACT inhaler [id number] Yes INHALE 2 PUFFS BY MOUTH EVERY 4 HOURS AS NEEDED FOR WHEEZE OR FOR SHORTNESS OF BREATH Michael Garnette KIDD, MD  Active Self, Pharmacy Records           Med Note ([name]   Fri Aug 18, 2024  3:52 AM) Rescue inhaler  ALPRAZolam  (XANAX ) 0.5 MG tablet [id number] Yes Take 1 tablet (0.5 mg total) by mouth 2 (two) times daily as needed for anxiety. Michael Jenkins Jansky, MD  Active   apixaban  (ELIQUIS ) 5 MG TABS tablet [id number] Yes Take 1 tablet (5 mg total) by mouth 2 (two) times daily.  Michael Redell RAMAN, MD  Active   empagliflozin  (JARDIANCE ) 25 MG TABS tablet [id number] Yes Take 1 tablet (25 mg total) by mouth daily before breakfast. Michael Garnette KIDD, MD  Active Self, Pharmacy Records  fluticasone  furoate-vilanterol (BREO ELLIPTA ) 200-25 MCG/ACT AEPB [id number]  Inhale 1 puff into the lungs daily.  Patient not taking: Reported on 09/28/2024   Michael Garnette KIDD, MD  Active Self, Pharmacy Records  furosemide  (LASIX ) 40 MG tablet [id number] Yes Take 1 tablet (40 mg total) by  mouth 2 (two) times daily. Michael Nilda HERO, MD  Active   lidocaine  4 % [id number] Yes Place 1 patch onto the skin daily. [provider]  Active Self, Pharmacy Records  metoprolol  tartrate (LOPRESSOR ) 50 MG tablet [id number] Yes Take 1 tablet (50 mg total) by mouth 2 (two) times daily. Hold if blood pressure is less than 110/65 or heart rate is less than 55. [name], [name]  Active   Multiple Vitamin (MULTIVITAMIN) tablet [id number] Yes Take 1 tablet by mouth daily. [provider]  Active Self, Pharmacy Records  omeprazole  (PRILOSEC) 40 MG capsule [id number] Yes TAKE 1 CAPSULE (40 MG TOTAL) BY MOUTH DAILY. OFFICE VISIT FOR FURTHER REFILLS Michael Garnette KIDD, MD  Active Self, Pharmacy Records  Polyethyl Glyc-Propyl Glyc PF (SYSTANE HYDRATION PF) 0.4-0.3 % SOLN [id number] Yes Place 1 drop into both eyes daily as needed (dry eyes, eye irritations). [provider]  Active Self, Pharmacy Records  predniSONE  (DELTASONE ) 20 MG tablet [id number] Yes Take by mouth. [provider]  Active   rosuvastatin  (CRESTOR ) 10 MG tablet [id number] Yes Take 1 tablet (10 mg total) by mouth daily. Michael Garnette KIDD, MD  Active Self, Pharmacy Records  traZODone  (DESYREL ) 100 MG tablet [id number] Yes Take 1-2 tablets (100-200 mg total) by mouth at bedtime as needed. [name]  Active Self, Pharmacy Records  Med List Note [name], CPhT 08/18/24 9646): CPAP              Card No.: [id number] BIN: [id number] PCN: [id number] PC Group: [id number]   Assessment/Plan:   Medication Management / Access - Patient approved for for [facility] - should cover all medication cost for cardiomyopathy / CHF including Eliquis  and Jardiance .    CHF:  - Reviewed importance of weighing daily. Patient has been weighing daily and knows what to do if weight increase by more than 3 lbs in 24 hours or 5 lbs in 1 week.  - Continue metoprolol , furosemide  and Jardiance . - Due to refill  Jardiance  but needs update Rx - will send request to PCP.   - Follow up as planned with cardiology team in next week.     Follow Up Plan: 1 to 2 months  [name], PharmD Clinical Pharmacist [facility]  Population Health [phone]   "

## 2024-09-29 ENCOUNTER — Encounter: Payer: Self-pay | Admitting: *Deleted

## 2024-10-03 ENCOUNTER — Encounter: Payer: Self-pay | Admitting: Emergency Medicine

## 2024-10-03 ENCOUNTER — Ambulatory Visit: Admitting: Emergency Medicine

## 2024-10-03 VITALS — BP 116/78 | HR 92 | Ht 70.0 in | Wt 245.0 lb

## 2024-10-03 DIAGNOSIS — I428 Other cardiomyopathies: Secondary | ICD-10-CM | POA: Insufficient documentation

## 2024-10-03 DIAGNOSIS — I509 Heart failure, unspecified: Secondary | ICD-10-CM | POA: Insufficient documentation

## 2024-10-03 DIAGNOSIS — I10 Essential (primary) hypertension: Secondary | ICD-10-CM | POA: Insufficient documentation

## 2024-10-03 DIAGNOSIS — Z79899 Other long term (current) drug therapy: Secondary | ICD-10-CM | POA: Insufficient documentation

## 2024-10-03 DIAGNOSIS — I4819 Other persistent atrial fibrillation: Secondary | ICD-10-CM | POA: Diagnosis not present

## 2024-10-03 DIAGNOSIS — I4892 Unspecified atrial flutter: Secondary | ICD-10-CM | POA: Insufficient documentation

## 2024-10-03 LAB — BASIC METABOLIC PANEL WITH GFR
BUN/Creatinine Ratio: 18 (ref 10–24)
BUN: 42 mg/dL — ABNORMAL HIGH (ref 8–27)
CO2: 20 mmol/L (ref 20–29)
Calcium: 9.6 mg/dL (ref 8.6–10.2)
Chloride: 98 mmol/L (ref 96–106)
Creatinine, Ser: 2.35 mg/dL — ABNORMAL HIGH (ref 0.76–1.27)
Glucose: 228 mg/dL — ABNORMAL HIGH (ref 70–99)
Potassium: 3.9 mmol/L (ref 3.5–5.2)
Sodium: 140 mmol/L (ref 134–144)
eGFR: 31 mL/min/{1.73_m2} — ABNORMAL LOW

## 2024-10-03 LAB — CBC
Hematocrit: 43.7 % (ref 37.5–51.0)
Hemoglobin: 14.1 g/dL (ref 13.0–17.7)
MCH: 30.1 pg (ref 26.6–33.0)
MCHC: 32.3 g/dL (ref 31.5–35.7)
MCV: 93 fL (ref 79–97)
Platelets: 178 10*3/uL (ref 150–450)
RBC: 4.68 x10E6/uL (ref 4.14–5.80)
RDW: 12.5 % (ref 11.6–15.4)
WBC: 19.4 10*3/uL — ABNORMAL HIGH (ref 3.4–10.8)

## 2024-10-03 NOTE — Telephone Encounter (Signed)
 Please advise on refills. Please and thank you.   Copied from CRM #[id number]. Topic: Clinical - Medication Refill >> Oct 03, 2024  3:17 PM Eva FALCON wrote: Medication: ALPRAZolam  (XANAX ) 0.5 MG tablet Freestyle libre test strips Lancet needles.   Has the patient contacted their pharmacy? Yes, need new scripts. (Agent: If no, request that the patient contact the pharmacy for the refill. If patient does not wish to contact the pharmacy document the reason why and proceed with request.) (Agent: If yes, when and what did the pharmacy advise?)  This is the patient's preferred pharmacy:  [id number] [place], [place] - [address] [place] [address] Phone: [phone] Fax: [phone]  Is this the correct pharmacy for this prescription? Yes If no, delete pharmacy and type the correct one.   Has the prescription been filled recently? No  Is the patient out of the medication? Yes  Has the patient been seen for an appointment in the last year OR does the patient have an upcoming appointment? Yes  Can we respond through MyChart? Yes  Agent: Please be advised that Rx refills may take up to 3 business days. We ask that you follow-up with your pharmacy.

## 2024-10-03 NOTE — Progress Notes (Signed)
 " Cardiology Office Note:    Date:  10/03/2024  ID:  [name] , DOB [birth year], MRN [id number] PCP: [name], MD  [facility] Providers Cardiologist:  Redell Shallow, MD       Patient Profile:       Chief Complaint: 1 month follow-up for PAF History of Present Illness:  [name]  is a 64 y.o. male with visit-pertinent history of chronic systolic and diastolic heart failure, hypertensive heart disease, nonischemic or myopathy, obesity, T2DM, hyperlipidemia  Echocardiogram in 2018 showed LVEF 30 to 35%, grade 1 DD, aortic root measuring 43 mm.  Subsequent cardiac catheterization on 07/2017 showed nonobstructive disease.  EF recovered with GDMT with LVEF improvement to 50 to 55% on echocardiogram 10/2017.    He was seen in clinic on 08/17/2024 for swelling and weight gain.  He was found to be in atrial fibrillation with RVR with heart rate of 104 bpm.  He was referred to the emergency department for further evaluation and hospital mission for IV diuresis and IV heparin  given upcoming kidney biopsy.  While in the ED he was found to be mildly hypotensive.  CTA PE without evidence of pulmonary embolism, no acute heart strain, mild cardiomegaly, minimal single-vessel CAD.  Echocardiogram 1 08/2024 showed LVEF 55%, no RWMA, mild LVH, indeterminate diastolic parameters, normal RV, mildly dilated atria bilaterally, no valvular abnormalities.  He was diuresed and carvedilol  was switched to metoprolol .  Entresto  and spironolactone  stopped secondary to declining kidney function.  He was discharged on metoprolol , Jardiance , furosemide , rosuvastatin , and Eliquis .  Plan was to continue Eliquis  and perform DCCV outpatient after 21 days of uninterrupted OAC therapy.  Last seen in clinic on 09/12/2024.  EKG showed atrial flutter.  It was noted that he had missed his morning dose of Eliquis  and it was plan to reschedule DCCV for 1 month out.  His metoprolol  was increased to 50 mg twice  daily.    Discussed the use of AI scribe software for clinical note transcription with the patient, who gave verbal consent to proceed.  History of Present Illness [name]  is a 64 year old male with atrial fibrillation who presents for 1 month follow-up for PAF.  He is accompanied by his wife.  Today he is doing well without acute cardiovascular concerns or complaints.  He missed one dose of Eliquis  on September 12, 2024, and has been compliant since with uninterrupted anticoagulation for at least the past three weeks in preparation for cardioversion.  From a PAF standpoint he remains entirely asymptomatic without complaints of palpitations, dyspnea, tachycardia, or fatigue.  He has heart failure with reduced ejection fraction diagnosed in 2018 with prior EF 30%, now improved to 55% on recent echocardiogram. During his December 2025 hospitalization he had fluid retention, and spironolactone  and Entresto  were stopped due to kidney dysfunction. Kidney function has improved, confirmed by recent biopsy.  He is followed by [facility].  He has no dyspnea, weight gain, orthopnea, PND, chest pain, syncope, presyncope, dizziness.  He checks his rhythm with a home blood pressure monitor that flags irregular rhythms.   Review of systems:  Please see the history of present illness. All other systems are reviewed and otherwise negative.      Studies Reviewed:    EKG Interpretation Date/Time:  Tuesday October 03 2024 09:15:54 EST Ventricular Rate:  92 PR Interval:    QRS Duration:  94 QT Interval:  342 QTC Calculation: 422 R Axis:   57  Text  Interpretation: Atrial fibrillation with premature ventricular or aberrantly conducted complexes When compared with ECG of 12-Sep-2024 13:54, Atrial fibrillation has replaced Atrial flutter Confirmed by Rana Dixon [phone]) on 10/03/2024 10:06:15 AM    Echocardiogram 08/18/2024 1. Left ventricular ejection fraction, by estimation, is 55%. The  left  ventricle has normal function. The left ventricle has no regional wall  motion abnormalities. There is mild concentric left ventricular  hypertrophy. Left ventricular diastolic  parameters are indeterminate.   2. Right ventricular systolic function is normal. The right ventricular  size is normal. Tricuspid regurgitation signal is inadequate for assessing  PA pressure.   3. Left atrial size was mildly dilated.   4. Right atrial size was mildly dilated.   5. The mitral valve is normal in structure. No evidence of mitral valve  regurgitation. No evidence of mitral stenosis.   6. The aortic valve is tricuspid. Aortic valve regurgitation is not  visualized. No aortic stenosis is present.   7. The inferior vena cava is normal in size with greater than 50%  respiratory variability, suggesting right atrial pressure of 3 mmHg.   8. The patient was in atrial fibrillation.   Echocardiogram 03/06/2024  1. Left ventricular ejection fraction, by estimation, is 55 to 60%. Left  ventricular ejection fraction by 3D volume is 53 %. The left ventricle has  normal function. The left ventricle has no regional wall motion  abnormalities. Left ventricular diastolic   parameters were normal.   2. Right ventricular systolic function is normal. The right ventricular  size is normal. There is normal pulmonary artery systolic pressure. The  estimated right ventricular systolic pressure is 20.6 mmHg.   3. Left atrial size was mildly dilated.   4. The mitral valve is normal in structure. Trivial mitral valve  regurgitation. No evidence of mitral stenosis.   5. The aortic valve is normal in structure. Aortic valve regurgitation is  not visualized. No aortic stenosis is present.   6. The inferior vena cava is normal in size with greater than 50%  respiratory variability, suggesting right atrial pressure of 3 mmHg.   Echocardiogram 12/01/2022  1. Left ventricular ejection fraction, by estimation, is 50 to  55%. The  left ventricle has low normal function. The left ventricle has no regional  wall motion abnormalities. Left ventricular diastolic parameters are  consistent with Grade I diastolic  dysfunction (impaired relaxation).   2. Right ventricular systolic function is normal. The right ventricular  size is normal. There is normal pulmonary artery systolic pressure.   3. The mitral valve is normal in structure. Trivial mitral valve  regurgitation. No evidence of mitral stenosis.   4. The aortic valve is normal in structure. Aortic valve regurgitation is  not visualized. No aortic stenosis is present.   5. The inferior vena cava is normal in size with greater than 50%  respiratory variability, suggesting right atrial pressure of 3 mmHg.   Cardiac catheterization 08/06/2017 1. No angiographic evidence of CAD 2. Elevated LVEDP Diagnostic Dominance: Right  Risk Assessment/Calculations:    CHA2DS2-VASc Score = 3   This indicates a 3.2% annual risk of stroke. The patient's score is based upon: CHF History: 1 HTN History: 1 Diabetes History: 1 Stroke History: 0 Vascular Disease History: 0 Age Score: 0 Gender Score: 0             Physical Exam:   VS:  BP 116/78 (BP Location: Left Arm, Patient Position: Sitting, Cuff Size: Normal)   Pulse 92  Ht 5' 10 (1.778 m)   Wt 245 lb (111.1 kg)   BMI 35.15 kg/m    Wt Readings from Last 3 Encounters:  10/03/24 245 lb (111.1 kg)  09/12/24 261 lb 9.6 oz (118.7 kg)  08/24/24 285 lb (129.3 kg)    GEN: Well nourished, well developed in no acute distress NECK: No JVD; No carotid bruits CARDIAC: Irregularly irregular, no murmurs, rubs, gallops RESPIRATORY:  Clear to auscultation without rales, wheezing or rhonchi  ABDOMEN: Soft, non-tender, non-distended EXTREMITIES:  No edema; No acute deformity      Assessment and Plan:  Atrial flutter Persistent atrial fibrillation Diagnosed 08/2024 - Today he remains in rate controlled atrial  fibrillation with heart rate of 92 bpm - He also remains entirely asymptomatic and is unaware of his arrhythmia - He has had at least 3 weeks of uninterrupted anticoagulation with no missed doses - Will plan for DCCV on 10/13/2024 with Dr. Mona - Continue Eliquis  5 mg twice daily uninterrupted for at least 4 weeks post DCCV - Furthermore we will plan to refer to EP to consider ablation - Continue metoprolol  to tartrate 50 mg twice daily  Nonischemic cardiomyopathy Chronic systolic and diastolic heart failure Prior LVEF of 30 to 35% with normal coronaries in 2018 felt to be related to hypertension Most recent echocardiogram 08/2024 showed LVEF of 55%, no RWMA, RV normal Admitted 08/2024 in CHF exacerbation in the setting of atrial fibrillation.  Entresto  and spironolactone  were discontinued at that time secondary to declining renal function - Today patient appears euvolemic and well compensated on exam - He is without dyspnea, orthopnea, PND, weight gain, or LEE - Unable to add back MRA/ARB/ARNI due to CKD - Continue Jardiance  25 mg daily, furosemide  40 mg twice daily, metoprolol  tartrate 50 mg twice daily  Hypertension Blood pressure today is well-controlled at 116/78 - Continue metoprolol  tartrate 50 mg twice daily  CKD stage III Creatinine 2.0 and GFR 35 on 09/2024 - Managed by [facility] - Stable.  Avoid nephrotoxic drugs  Obstructive sleep apnea - Remains adherent to CPAP therapy    Informed Consent   Shared Decision Making/Informed Consent The risks (stroke, cardiac arrhythmias rarely resulting in the need for a temporary or permanent pacemaker, skin irritation or burns and complications associated with conscious sedation including aspiration, arrhythmia, respiratory failure and death), benefits (restoration of normal sinus rhythm) and alternatives of a direct current cardioversion were explained in detail to Mr. Sedler  and he agrees to proceed.       Dispo:  Return in  about 1 month (around 11/03/2024).  Signed, Lum LITTIE Louis, NP  "

## 2024-10-03 NOTE — H&P (View-Only) (Signed)
 " Cardiology Office Note:    Date:  10/03/2024  ID:  [name] , DOB [birth year], MRN [id number] PCP: [name], MD  [facility] Providers Cardiologist:  Redell Shallow, MD       Patient Profile:       Chief Complaint: 1 month follow-up for PAF History of Present Illness:  [name]  is a 64 y.o. male with visit-pertinent history of chronic systolic and diastolic heart failure, hypertensive heart disease, nonischemic or myopathy, obesity, T2DM, hyperlipidemia  Echocardiogram in 2018 showed LVEF 30 to 35%, grade 1 DD, aortic root measuring 43 mm.  Subsequent cardiac catheterization on 07/2017 showed nonobstructive disease.  EF recovered with GDMT with LVEF improvement to 50 to 55% on echocardiogram 10/2017.    He was seen in clinic on 08/17/2024 for swelling and weight gain.  He was found to be in atrial fibrillation with RVR with heart rate of 104 bpm.  He was referred to the emergency department for further evaluation and hospital mission for IV diuresis and IV heparin  given upcoming kidney biopsy.  While in the ED he was found to be mildly hypotensive.  CTA PE without evidence of pulmonary embolism, no acute heart strain, mild cardiomegaly, minimal single-vessel CAD.  Echocardiogram 1 08/2024 showed LVEF 55%, no RWMA, mild LVH, indeterminate diastolic parameters, normal RV, mildly dilated atria bilaterally, no valvular abnormalities.  He was diuresed and carvedilol  was switched to metoprolol .  Entresto  and spironolactone  stopped secondary to declining kidney function.  He was discharged on metoprolol , Jardiance , furosemide , rosuvastatin , and Eliquis .  Plan was to continue Eliquis  and perform DCCV outpatient after 21 days of uninterrupted OAC therapy.  Last seen in clinic on 09/12/2024.  EKG showed atrial flutter.  It was noted that he had missed his morning dose of Eliquis  and it was plan to reschedule DCCV for 1 month out.  His metoprolol  was increased to 50 mg twice  daily.    Discussed the use of AI scribe software for clinical note transcription with the patient, who gave verbal consent to proceed.  History of Present Illness [name]  is a 64 year old male with atrial fibrillation who presents for 1 month follow-up for PAF.  He is accompanied by his wife.  Today he is doing well without acute cardiovascular concerns or complaints.  He missed one dose of Eliquis  on September 12, 2024, and has been compliant since with uninterrupted anticoagulation for at least the past three weeks in preparation for cardioversion.  From a PAF standpoint he remains entirely asymptomatic without complaints of palpitations, dyspnea, tachycardia, or fatigue.  He has heart failure with reduced ejection fraction diagnosed in 2018 with prior EF 30%, now improved to 55% on recent echocardiogram. During his December 2025 hospitalization he had fluid retention, and spironolactone  and Entresto  were stopped due to kidney dysfunction. Kidney function has improved, confirmed by recent biopsy.  He is followed by [facility].  He has no dyspnea, weight gain, orthopnea, PND, chest pain, syncope, presyncope, dizziness.  He checks his rhythm with a home blood pressure monitor that flags irregular rhythms.   Review of systems:  Please see the history of present illness. All other systems are reviewed and otherwise negative.      Studies Reviewed:    EKG Interpretation Date/Time:  Tuesday October 03 2024 09:15:54 EST Ventricular Rate:  92 PR Interval:    QRS Duration:  94 QT Interval:  342 QTC Calculation: 422 R Axis:   57  Text  Interpretation: Atrial fibrillation with premature ventricular or aberrantly conducted complexes When compared with ECG of 12-Sep-2024 13:54, Atrial fibrillation has replaced Atrial flutter Confirmed by Rana Dixon [phone]) on 10/03/2024 10:06:15 AM    Echocardiogram 08/18/2024 1. Left ventricular ejection fraction, by estimation, is 55%. The  left  ventricle has normal function. The left ventricle has no regional wall  motion abnormalities. There is mild concentric left ventricular  hypertrophy. Left ventricular diastolic  parameters are indeterminate.   2. Right ventricular systolic function is normal. The right ventricular  size is normal. Tricuspid regurgitation signal is inadequate for assessing  PA pressure.   3. Left atrial size was mildly dilated.   4. Right atrial size was mildly dilated.   5. The mitral valve is normal in structure. No evidence of mitral valve  regurgitation. No evidence of mitral stenosis.   6. The aortic valve is tricuspid. Aortic valve regurgitation is not  visualized. No aortic stenosis is present.   7. The inferior vena cava is normal in size with greater than 50%  respiratory variability, suggesting right atrial pressure of 3 mmHg.   8. The patient was in atrial fibrillation.   Echocardiogram 03/06/2024  1. Left ventricular ejection fraction, by estimation, is 55 to 60%. Left  ventricular ejection fraction by 3D volume is 53 %. The left ventricle has  normal function. The left ventricle has no regional wall motion  abnormalities. Left ventricular diastolic   parameters were normal.   2. Right ventricular systolic function is normal. The right ventricular  size is normal. There is normal pulmonary artery systolic pressure. The  estimated right ventricular systolic pressure is 20.6 mmHg.   3. Left atrial size was mildly dilated.   4. The mitral valve is normal in structure. Trivial mitral valve  regurgitation. No evidence of mitral stenosis.   5. The aortic valve is normal in structure. Aortic valve regurgitation is  not visualized. No aortic stenosis is present.   6. The inferior vena cava is normal in size with greater than 50%  respiratory variability, suggesting right atrial pressure of 3 mmHg.   Echocardiogram 12/01/2022  1. Left ventricular ejection fraction, by estimation, is 50 to  55%. The  left ventricle has low normal function. The left ventricle has no regional  wall motion abnormalities. Left ventricular diastolic parameters are  consistent with Grade I diastolic  dysfunction (impaired relaxation).   2. Right ventricular systolic function is normal. The right ventricular  size is normal. There is normal pulmonary artery systolic pressure.   3. The mitral valve is normal in structure. Trivial mitral valve  regurgitation. No evidence of mitral stenosis.   4. The aortic valve is normal in structure. Aortic valve regurgitation is  not visualized. No aortic stenosis is present.   5. The inferior vena cava is normal in size with greater than 50%  respiratory variability, suggesting right atrial pressure of 3 mmHg.   Cardiac catheterization 08/06/2017 1. No angiographic evidence of CAD 2. Elevated LVEDP Diagnostic Dominance: Right  Risk Assessment/Calculations:    CHA2DS2-VASc Score = 3   This indicates a 3.2% annual risk of stroke. The patient's score is based upon: CHF History: 1 HTN History: 1 Diabetes History: 1 Stroke History: 0 Vascular Disease History: 0 Age Score: 0 Gender Score: 0             Physical Exam:   VS:  BP 116/78 (BP Location: Left Arm, Patient Position: Sitting, Cuff Size: Normal)   Pulse 92  Ht 5' 10 (1.778 m)   Wt 245 lb (111.1 kg)   BMI 35.15 kg/m    Wt Readings from Last 3 Encounters:  10/03/24 245 lb (111.1 kg)  09/12/24 261 lb 9.6 oz (118.7 kg)  08/24/24 285 lb (129.3 kg)    GEN: Well nourished, well developed in no acute distress NECK: No JVD; No carotid bruits CARDIAC: Irregularly irregular, no murmurs, rubs, gallops RESPIRATORY:  Clear to auscultation without rales, wheezing or rhonchi  ABDOMEN: Soft, non-tender, non-distended EXTREMITIES:  No edema; No acute deformity      Assessment and Plan:  Atrial flutter Persistent atrial fibrillation Diagnosed 08/2024 - Today he remains in rate controlled atrial  fibrillation with heart rate of 92 bpm - He also remains entirely asymptomatic and is unaware of his arrhythmia - He has had at least 3 weeks of uninterrupted anticoagulation with no missed doses - Will plan for DCCV on 10/13/2024 with Dr. Mona - Continue Eliquis  5 mg twice daily uninterrupted for at least 4 weeks post DCCV - Furthermore we will plan to refer to EP to consider ablation - Continue metoprolol  to tartrate 50 mg twice daily  Nonischemic cardiomyopathy Chronic systolic and diastolic heart failure Prior LVEF of 30 to 35% with normal coronaries in 2018 felt to be related to hypertension Most recent echocardiogram 08/2024 showed LVEF of 55%, no RWMA, RV normal Admitted 08/2024 in CHF exacerbation in the setting of atrial fibrillation.  Entresto  and spironolactone  were discontinued at that time secondary to declining renal function - Today patient appears euvolemic and well compensated on exam - He is without dyspnea, orthopnea, PND, weight gain, or LEE - Unable to add back MRA/ARB/ARNI due to CKD - Continue Jardiance  25 mg daily, furosemide  40 mg twice daily, metoprolol  tartrate 50 mg twice daily  Hypertension Blood pressure today is well-controlled at 116/78 - Continue metoprolol  tartrate 50 mg twice daily  CKD stage III Creatinine 2.0 and GFR 35 on 09/2024 - Managed by [facility] - Stable.  Avoid nephrotoxic drugs  Obstructive sleep apnea - Remains adherent to CPAP therapy    Informed Consent   Shared Decision Making/Informed Consent The risks (stroke, cardiac arrhythmias rarely resulting in the need for a temporary or permanent pacemaker, skin irritation or burns and complications associated with conscious sedation including aspiration, arrhythmia, respiratory failure and death), benefits (restoration of normal sinus rhythm) and alternatives of a direct current cardioversion were explained in detail to Mr. Sedler  and he agrees to proceed.       Dispo:  Return in  about 1 month (around 11/03/2024).  Signed, Lum LITTIE Louis, NP  "

## 2024-10-03 NOTE — Patient Instructions (Signed)
 Medication Instructions:  NO CHANGES  Lab Work: BMET AND CBC TO BE DONE TODAY.  Testing/Procedures:    Dear [name]   You are scheduled for a Cardioversion on Friday, February 6 with Dr. MONA.  Please arrive at the [facility] (Main Entrance A) at [facility]: [address] [place], [place] [address] at 9:00 AM (This time is 1 hour(s) before your procedure to ensure your preparation).   Free valet parking service is available. You will check in at ADMITTING.   *Please Note: You will receive a call the day before your procedure to confirm the appointment time. That time may have changed from the original time based on the schedule for that day.*    DIET:  Nothing to eat or drink after midnight except a sip of water with medications (see medication instructions below)  HOLD: Empagliflozin  (Jardiance ) for 3 days prior to the procedure. Last dose on Monday, October 09, 2024.  Continue taking your anticoagulant (blood thinner): Apixaban  (Eliquis ).  You will need to continue this after your procedure until you are told by your provider that it is safe to stop.    LABS: TO BE DONE TODAY.  Come to the lab at the [facility] ([address] [place], 1st Floor) between the hours of 8:00 am and 4:30 pm. You do NOT have to be fasting.  FYI:  For your safety, and to allow us  to monitor your vital signs accurately during the surgery/procedure we request: If you have artificial nails, gel coating, SNS etc, please have those removed prior to your surgery/procedure. Not having the nail coverings /polish removed may result in cancellation or delay of your surgery/procedure.  Your support person will be asked to wait in the waiting room during your procedure.  It is OK to have someone drop you off and come back when you are ready to be discharged.  You cannot drive after the procedure and will need someone to drive you home.  Bring  your insurance cards.  *Special Note: Every effort is made to have your procedure done on time. Occasionally there are emergencies that occur at the hospital that may cause delays. Please be patient if a delay does occur.      Follow-Up: At [facility], you and your health needs are our priority.  As part of our continuing mission to provide you with exceptional heart care, our providers are all part of one team.  This team includes your primary Cardiologist (physician) and Advanced Practice Providers or APPs (Physician Assistants and Nurse Practitioners) who all work together to provide you with the care you need, when you need it.  Your next appointment:   1 MONTH POST CARDIOVERSION  Provider:   Redell Shallow, MD OR Lum Louis, DNP   We recommend signing up for the patient portal called MyChart.  Sign up information is provided on this After Visit Summary.  MyChart is used to connect with patients for Virtual Visits (Telemedicine).  Patients are able to view lab/test results, encounter notes, upcoming appointments, etc.  Non-urgent messages can be sent to your provider as well.   To learn more about what you can do with MyChart, go to [email or web address].   Other Instructions:  REFERRAL TO EP HAS BEEN PUT IN FOR AFIB TO CONSIDER AN ABLATION. SOMEONE FROM OUR OFFICE WILL BE CONTACTING YOU TO GET YOU SCHEDULED.

## 2024-10-04 MED ORDER — ALPRAZOLAM 0.5 MG PO TABS: 0.5000 mg | ORAL_TABLET | Freq: Two times a day (BID) | ORAL | 0 refills | Status: AC | PRN

## 2024-10-04 NOTE — Addendum Note (Signed)
 Addended by: Ashvin Adelson, CREE A on: 10/04/2024 12:40 PM   Modules accepted: Orders

## 2024-10-04 NOTE — Addendum Note (Signed)
 Addended by: JODIE GAMMONS on: 10/04/2024 07:41 AM   Modules accepted: Orders

## 2024-10-04 NOTE — Telephone Encounter (Signed)
 Called and spoke to patient and informed him of medications being sent in. Patient verbalized understanding.

## 2024-10-05 ENCOUNTER — Ambulatory Visit: Payer: Self-pay | Admitting: Emergency Medicine

## 2024-10-05 MED ORDER — EMPAGLIFLOZIN 25 MG PO TABS: 25.0000 mg | ORAL_TABLET | Freq: Every day | ORAL | 0 refills | Status: AC

## 2024-10-05 NOTE — Telephone Encounter (Signed)
 I appreciate Dr. Jodie signing while I was out and we will see him next month

## 2024-10-12 ENCOUNTER — Other Ambulatory Visit: Payer: Self-pay | Admitting: Family Medicine

## 2024-10-12 NOTE — Progress Notes (Signed)
 Pt. Called for pre-procedure instructions. Arrival time 0830 NPO after midnight explained. Instructed to take AM meds with sip of water and confirmed blood thinner consistency. Instructed patient need for ride home and have responsible to be with them for 24 hrs.

## 2024-10-12 NOTE — Anesthesia Preprocedure Evaluation (Signed)
 "                                  Anesthesia Evaluation  Patient identified by MRN, date of birth, ID band Patient awake    Reviewed: Allergy & Precautions, NPO status , Patient's Chart, lab work & pertinent test results  History of Anesthesia Complications Negative for: history of anesthetic complications  Airway Mallampati: III  TM Distance: >3 FB Neck ROM: Full    Dental  (+) Dental Advisory Given   Pulmonary asthma , sleep apnea and Continuous Positive Airway Pressure Ventilation , COPD,  COPD inhaler, former smoker   Pulmonary exam normal breath sounds clear to auscultation       Cardiovascular hypertension, (-) angina +CHF and + DOE  (-) Past MI, (-) Cardiac Stents and (-) CABG + dysrhythmias Atrial Fibrillation  Rhythm:Irregular Rate:Normal  HLD  TTE 08/18/2024: IMPRESSIONS    1. Left ventricular ejection fraction, by estimation, is 55%. The left  ventricle has normal function. The left ventricle has no regional wall  motion abnormalities. There is mild concentric left ventricular  hypertrophy. Left ventricular diastolic  parameters are indeterminate.   2. Right ventricular systolic function is normal. The right ventricular  size is normal. Tricuspid regurgitation signal is inadequate for assessing  PA pressure.   3. Left atrial size was mildly dilated.   4. Right atrial size was mildly dilated.   5. The mitral valve is normal in structure. No evidence of mitral valve  regurgitation. No evidence of mitral stenosis.   6. The aortic valve is tricuspid. Aortic valve regurgitation is not  visualized. No aortic stenosis is present.   7. The inferior vena cava is normal in size with greater than 50%  respiratory variability, suggesting right atrial pressure of 3 mmHg.   8. The patient was in atrial fibrillation.   LHC 08/06/2017: 1. No angiographic evidence of CAD 2. Elevated LVEDP    Neuro/Psych  PSYCHIATRIC DISORDERS Anxiety Depression    negative  neurological ROS     GI/Hepatic Neg liver ROS,GERD  ,,IBS   Endo/Other  diabetes    Renal/GU CRFRenal disease (stage 3)     Musculoskeletal  (+) Arthritis ,    Abdominal  (+) + obese  Peds  Hematology negative hematology ROS (+) Lab Results      Component                Value               Date                      WBC                      19.4 (H)            10/03/2024                HGB                      14.1                10/03/2024                HCT                      43.7  10/03/2024                MCV                      93                  10/03/2024                PLT                      178                 10/03/2024              Anesthesia Other Findings   Reproductive/Obstetrics                              Anesthesia Physical Anesthesia Plan  ASA: 3  Anesthesia Plan: General   Post-op Pain Management: Minimal or no pain anticipated   Induction: Intravenous  PONV Risk Score and Plan: 2 and Treatment may vary due to age or medical condition  Airway Management Planned: Natural Airway and Nasal Cannula  Additional Equipment:   Intra-op Plan:   Post-operative Plan:   Informed Consent: I have reviewed the patients History and Physical, chart, labs and discussed the procedure including the risks, benefits and alternatives for the proposed anesthesia with the patient or authorized representative who has indicated his/her understanding and acceptance.       Plan Discussed with: CRNA and Anesthesiologist  Anesthesia Plan Comments: (Risks of general anesthesia discussed including, but not limited to, sore throat, hoarse voice, chipped/damaged teeth, injury to vocal cords, nausea and vomiting, allergic reactions, lung infection, heart attack, stroke, and death. All questions answered. )         Anesthesia Quick Evaluation  "

## 2024-10-13 ENCOUNTER — Encounter (HOSPITAL_COMMUNITY): Admission: RE | Disposition: A | Payer: Self-pay | Source: Home / Self Care | Attending: Internal Medicine

## 2024-10-13 ENCOUNTER — Encounter (HOSPITAL_COMMUNITY): Payer: Self-pay | Admitting: Anesthesiology

## 2024-10-13 ENCOUNTER — Other Ambulatory Visit: Payer: Self-pay

## 2024-10-13 ENCOUNTER — Encounter (HOSPITAL_COMMUNITY): Payer: Self-pay | Admitting: Internal Medicine

## 2024-10-13 ENCOUNTER — Ambulatory Visit (HOSPITAL_COMMUNITY): Admit: 2024-10-13 | Admitting: Internal Medicine

## 2024-10-13 DIAGNOSIS — I4819 Other persistent atrial fibrillation: Secondary | ICD-10-CM

## 2024-10-13 MED ORDER — PROPOFOL 10 MG/ML IV BOLUS
INTRAVENOUS | Status: DC | PRN
  Administered 2024-10-13: 80 mg via INTRAVENOUS

## 2024-10-13 MED ORDER — LIDOCAINE HCL (PF) 2 % IJ SOLN
INTRAMUSCULAR | Status: DC | PRN
  Administered 2024-10-13: 60 mg via INTRADERMAL

## 2024-10-13 MED ORDER — SODIUM CHLORIDE 0.9 % IV SOLN: INTRAVENOUS | Status: DC

## 2024-10-13 NOTE — CV Procedure (Signed)
" ° ° °  CARDIOVERSION NOTE  Procedure: Electrical Cardioversion Indications:  Atrial Fibrillation  Procedure Details:  Consent: Risks of procedure as well as the alternatives and risks of each were explained to the (patient/caregiver).  Consent for procedure obtained.  Time Out: Verified patient identification, verified procedure, site/side was marked, verified correct patient position, special equipment/implants available, medications/allergies/relevent history reviewed, required imaging and test results available.  Performed  Patient placed on cardiac monitor, pulse oximetry, supplemental oxygen as necessary.  Sedation given: propofol  per anesthesia Pacer pads placed anterior and posterior chest.  Cardioverted 1 time(s).  Cardioverted at 300J biphasic.  Impression: Findings: Post procedure EKG shows: NSR with PAC's and PVC's Complications: None Patient did tolerate procedure well.  Plan: Successful DCCV with a single 300J biphasic shock to NSR with PAC's and PVC's  Time Spent Directly with the Patient:  30 minutes   Vinie KYM Maxcy, MD, [facility], FNLA, FACP  [place]  [facility]  Medical Director of the [facility] Lipid [facility] &  Cardiovascular Risk Reduction Clinic Diplomate of the American Board of Clinical Lipidology Attending Cardiologist  Direct Dial: [phone]  Fax: [phone]  Website:  [email or web address] Vinie BROCKS Victoriah Wilds 10/13/2024, 9:41 AM     "

## 2024-10-13 NOTE — Transfer of Care (Signed)
 Immediate Anesthesia Transfer of Care Note  Patient: [name]   Procedure(s) Performed: CARDIOVERSION  Patient Location: Cath Lab  Anesthesia Type:General  Level of Consciousness: drowsy and patient cooperative  Airway & Oxygen Therapy: Patient Spontanous Breathing and Patient connected to nasal cannula oxygen  Post-op Assessment: Report given to RN, Post -op Vital signs reviewed and stable, Patient moving all extremities, and Patient moving all extremities X 4  Post vital signs: Reviewed and stable  Last Vitals:  Vitals Value Taken Time  BP 118/75 10/13/24 09:40  Temp    Pulse 93 10/13/24 09:40  Resp 12 10/13/24 09:40  SpO2 96 10/13/24 09:40    Last Pain:  Vitals:   10/13/24 0906  TempSrc:   PainSc: 0-No pain         Complications: No notable events documented.

## 2024-10-13 NOTE — Interval H&P Note (Signed)
 History and Physical Interval Note:  10/13/2024 9:00 AM  [name]   has presented today for surgery, with the diagnosis of AFIB.  The various methods of treatment have been discussed with the patient and family. After consideration of risks, benefits and other options for treatment, the patient has consented to  Procedures: CARDIOVERSION (N/A) as a surgical intervention.  The patient's history has been reviewed, patient examined, no change in status, stable for surgery.  I have reviewed the patient's chart and labs.  Questions were answered to the patient's satisfaction.     [name]

## 2024-10-13 NOTE — Anesthesia Postprocedure Evaluation (Signed)
"   Anesthesia Post Note  Patient: [name]   Procedure(s) Performed: CARDIOVERSION     Patient location during evaluation: PACU Anesthesia Type: General Level of consciousness: awake Pain management: pain level controlled Vital Signs Assessment: post-procedure vital signs reviewed and stable Respiratory status: spontaneous breathing, nonlabored ventilation and respiratory function stable Cardiovascular status: blood pressure returned to baseline and stable Postop Assessment: no apparent nausea or vomiting Anesthetic complications: no   No notable events documented.  Last Vitals:  Vitals:   10/13/24 0955 10/13/24 1000  BP: (!) 134/90 (!) 149/73  Pulse: 76 65  Resp: 12 18  Temp:    SpO2: 98% 95%    Last Pain:  Vitals:   10/13/24 0940  TempSrc:   PainSc: 0-No pain                 Delon Aisha Arch      "

## 2024-10-18 ENCOUNTER — Telehealth: Payer: Medicare Other | Admitting: Family Medicine

## 2024-10-23 ENCOUNTER — Ambulatory Visit: Admitting: Family Medicine

## 2024-10-30 ENCOUNTER — Telehealth: Admitting: Family Medicine

## 2024-11-14 ENCOUNTER — Ambulatory Visit: Admitting: Emergency Medicine

## 2024-12-18 ENCOUNTER — Ambulatory Visit: Admitting: Podiatry

## 2025-01-04 ENCOUNTER — Ambulatory Visit
